# Patient Record
Sex: Female | Born: 1935
Health system: Southern US, Community
[De-identification: ages and names within clinical notes are randomized; demographics above are authoritative.]

## PROBLEM LIST (undated history)

## (undated) DIAGNOSIS — L309 Dermatitis, unspecified: Secondary | ICD-10-CM

## (undated) DIAGNOSIS — I10 Essential (primary) hypertension: Secondary | ICD-10-CM

## (undated) DIAGNOSIS — E0500 Thyrotoxicosis with diffuse goiter without thyrotoxic crisis or storm: Secondary | ICD-10-CM

## (undated) HISTORY — PX: TUBAL LIGATION: SHX77

## (undated) HISTORY — DX: Thyrotoxicosis with diffuse goiter without thyrotoxic crisis or storm: E05.00

## (undated) HISTORY — DX: Essential (primary) hypertension: I10

## (undated) HISTORY — DX: Dermatitis, unspecified: L30.9

## (undated) HISTORY — PX: TOTAL ABDOMINAL HYSTERECTOMY: SHX209

---

## 1999-05-06 ENCOUNTER — Other Ambulatory Visit: Admission: RE | Admit: 1999-05-06 | Discharge: 1999-05-06 | Payer: Self-pay | Admitting: *Deleted

## 2000-07-12 ENCOUNTER — Other Ambulatory Visit: Admission: RE | Admit: 2000-07-12 | Discharge: 2000-07-12 | Payer: Self-pay | Admitting: Internal Medicine

## 2002-06-23 ENCOUNTER — Encounter: Admission: RE | Admit: 2002-06-23 | Discharge: 2002-06-23 | Payer: Self-pay | Admitting: Internal Medicine

## 2002-06-23 ENCOUNTER — Encounter: Payer: Self-pay | Admitting: Internal Medicine

## 2003-04-21 ENCOUNTER — Emergency Department (HOSPITAL_COMMUNITY): Admission: EM | Admit: 2003-04-21 | Discharge: 2003-04-21 | Payer: Self-pay | Admitting: Emergency Medicine

## 2008-03-11 ENCOUNTER — Emergency Department (HOSPITAL_COMMUNITY): Admission: EM | Admit: 2008-03-11 | Discharge: 2008-03-11 | Payer: Self-pay | Admitting: Emergency Medicine

## 2010-07-20 ENCOUNTER — Encounter (HOSPITAL_COMMUNITY): Payer: Self-pay | Admitting: Radiology

## 2010-07-20 ENCOUNTER — Inpatient Hospital Stay (HOSPITAL_COMMUNITY)
Admission: EM | Admit: 2010-07-20 | Discharge: 2010-07-22 | DRG: 379 | Disposition: A | Discharge status: Home / Self Care | Payer: Medicare Other | Attending: Internal Medicine | Admitting: Internal Medicine

## 2010-07-20 ENCOUNTER — Emergency Department (HOSPITAL_COMMUNITY): Payer: Medicare Other

## 2010-07-20 DIAGNOSIS — D500 Iron deficiency anemia secondary to blood loss (chronic): Secondary | ICD-10-CM | POA: Diagnosis not present

## 2010-07-20 DIAGNOSIS — K621 Rectal polyp: Secondary | ICD-10-CM | POA: Diagnosis present

## 2010-07-20 DIAGNOSIS — K625 Hemorrhage of anus and rectum: Principal | ICD-10-CM | POA: Diagnosis present

## 2010-07-20 DIAGNOSIS — K620 Anal polyp: Secondary | ICD-10-CM | POA: Diagnosis present

## 2010-07-20 DIAGNOSIS — I10 Essential (primary) hypertension: Secondary | ICD-10-CM | POA: Diagnosis present

## 2010-07-20 DIAGNOSIS — K5730 Diverticulosis of large intestine without perforation or abscess without bleeding: Secondary | ICD-10-CM | POA: Diagnosis present

## 2010-07-20 HISTORY — DX: Essential (primary) hypertension: I10

## 2010-07-20 LAB — POCT I-STAT, CHEM 8
BUN: 19 mg/dL (ref 6–23)
Calcium, Ion: 1.16 mmol/L (ref 1.12–1.32)
Chloride: 109 mEq/L (ref 96–112)
Creatinine, Ser: 0.9 mg/dL (ref 0.4–1.2)
Glucose, Bld: 122 mg/dL — ABNORMAL HIGH (ref 70–99)
TCO2: 22 mmol/L (ref 0–100)

## 2010-07-20 LAB — DIFFERENTIAL
Lymphocytes Relative: 19 % (ref 12–46)
Lymphs Abs: 1.3 10*3/uL (ref 0.7–4.0)
Monocytes Absolute: 0.4 10*3/uL (ref 0.1–1.0)
Monocytes Relative: 6 % (ref 3–12)
Neutro Abs: 4.9 10*3/uL (ref 1.7–7.7)
Neutrophils Relative %: 73 % (ref 43–77)

## 2010-07-20 LAB — HEMOGLOBIN AND HEMATOCRIT, BLOOD
HCT: 38 % (ref 36.0–46.0)
HCT: 36 % (ref 36.0–46.0)
Hemoglobin: 12.5 g/dL (ref 12.0–15.0)
Hemoglobin: 12.1 g/dL (ref 12.0–15.0)

## 2010-07-20 LAB — CBC
HCT: 38.8 % (ref 36.0–46.0)
Hemoglobin: 12.6 g/dL (ref 12.0–15.0)
MCH: 28.1 pg (ref 26.0–34.0)
MCHC: 32.5 g/dL (ref 30.0–36.0)
RBC: 4.49 MIL/uL (ref 3.87–5.11)

## 2010-07-20 MED ORDER — IOHEXOL 300 MG/ML  SOLN
100.0000 mL | Freq: Once | INTRAMUSCULAR | Status: AC | PRN
  Administered 2010-07-20: 100 mL via INTRAVENOUS

## 2010-07-21 LAB — BASIC METABOLIC PANEL
BUN: 10 mg/dL (ref 6–23)
Calcium: 8.3 mg/dL — ABNORMAL LOW (ref 8.4–10.5)
Chloride: 109 mEq/L (ref 96–112)
Creatinine, Ser: 0.64 mg/dL (ref 0.4–1.2)
GFR calc Af Amer: >60 mL/min (ref >60)
GFR calc non Af Amer: >60 mL/min (ref >60)

## 2010-07-21 LAB — HEMOGLOBIN AND HEMATOCRIT, BLOOD: Hemoglobin: 11.3 g/dL — ABNORMAL LOW (ref 12.0–15.0)

## 2010-07-22 ENCOUNTER — Other Ambulatory Visit: Payer: Self-pay | Admitting: Gastroenterology

## 2010-07-22 LAB — CBC
MCH: 27.8 pg (ref 26.0–34.0)
MCHC: 32.5 g/dL (ref 30.0–36.0)
MCV: 85.4 fL (ref 78.0–100.0)
Platelets: 230 10*3/uL (ref 150–400)
RBC: 4.18 MIL/uL (ref 3.87–5.11)

## 2010-07-29 NOTE — Op Note (Signed)
Ashley Johns, Ashley Johns               ACCOUNT NO.:  [id number]  MEDICAL RECORD NO.:  [id number]  LOCATION:  1517                         FACILITY:  [facility]  PHYSICIAN:  Bernette Redbird, M.D.   DATE OF BIRTH:  10-20-1935  DATE OF PROCEDURE:  07/22/2010 DATE OF DISCHARGE:                              OPERATIVE REPORT   PROCEDURE:  Flexible sigmoidoscopy with biopsy.  INDICATIONS:  The patient is a 75 year old female approximately 1 year status post screening colonoscopy by Dr. Bosie Clos that showed some mild patchy colitis both endoscopically and on biopsies, who presented to the hospital 2 days ago with  painless small-volume hematochezia.  After one or such bowel movements, subsequent bowel movements have been nonbloody and her hemoglobin which has dropped several grams since admission, has leveled off.  FINDINGS:  Solitary diverticulum in the sigmoid region.  No definite alternative source of bleeding identified.  PROCEDURE:  The procedure had been discussed with the patient who provided written consent and was brought from her hospital room into the endoscopy unit.  Sedation was fentanyl 25 mcg and Versed 4 mg IV without clinical instability.  A demonstration scope with the new Olympus 190 series, pediatric adjustable tension colonoscope, was used for this procedure following a Fleet's enema prep.  The scope was advanced to what I believe was the midportion of the transverse colon, with further advancement inhibited by the presence of a moderate amount of stool at the proximal extent of the exam.  Pullback was then performed.  The quality of prep from roughly the splenic flexure distally was excellent and it is felt that all areas in that section of the colon were well seen.  There was no blood in the colonic lumen, consistent with the fact that the patient did not see any blood during her prep.  Thus, it is very clear that the patient has stopped bleeding.  The stool at the  proximal extent of the exam was completely nonbloody in appearance.  No definite source of bleeding was seen on this exam.  At least one or two sigmoid diverticula were observed.  There was a small smooth roughly 4-mm sessile polyp in the distal rectum, nonhemorrhagic in appearance, which was removed by several cold biopsies.  This was otherwise normal exam.  There was no evident colitis, no patchy inflammation has had been observed a year ago.  No large polyps, cancer or vascular ectasia were noted.  No extensive diverticular change was seen.  Retroflexion in the rectum was otherwise unremarkable.  Pullout through the anal canal did not really demonstrate any significant internal hemorrhoids.  The patient tolerated the procedure well and there were no apparent complications.  IMPRESSION: 1. Recent rectal bleeding, without definite source seen on this     examination, currently quiescent 2. Minimal sigmoid diverticulosis. 3. Small rectal polyp.  PLAN:  Await pathology results on the polyp.  Okay for discharge.          ______________________________ Bernette Redbird, M.D.     RB/MEDQ  D:  07/22/2010  T:  07/22/2010  Job:  [id number]  cc:   Merlene Laughter. Renae Gloss, M.D. Fax: [phone]  Lorma Friar, MD Fax: [phone]  Electronically  Signed by Bernette Redbird M.D. on 07/29/2010 12:25:22 PM

## 2010-08-08 ENCOUNTER — Other Ambulatory Visit: Payer: Self-pay | Admitting: Internal Medicine

## 2010-08-08 DIAGNOSIS — Z1239 Encounter for other screening for malignant neoplasm of breast: Secondary | ICD-10-CM

## 2010-08-12 ENCOUNTER — Ambulatory Visit: Payer: Medicare Other

## 2010-08-18 ENCOUNTER — Ambulatory Visit: Payer: Medicare Other

## 2010-08-19 ENCOUNTER — Ambulatory Visit
Admission: RE | Admit: 2010-08-19 | Discharge: 2010-08-19 | Disposition: A | Discharge status: Home / Self Care | Payer: Medicare Other | Source: Ambulatory Visit | Attending: Internal Medicine | Admitting: Internal Medicine

## 2010-08-19 DIAGNOSIS — Z1239 Encounter for other screening for malignant neoplasm of breast: Secondary | ICD-10-CM

## 2010-08-26 NOTE — Discharge Summary (Signed)
  NAMEMarland Kitchen  CATHERIN, DOORN NO.:  [id number]  MEDICAL RECORD NO.:  [id number]  LOCATION:  1517                         FACILITY:  [facility]  PHYSICIAN:  Zannie Cove, MD     DATE OF BIRTH:  [birth year]  DATE OF ADMISSION:  07/20/2010 DATE OF DISCHARGE:                              DISCHARGE SUMMARY   PRIMARY CARE PHYSICIAN:  Merlene Laughter. Renae Gloss, MD  GASTROENTEROLOGIST:  Ceazia Friar, MD  DISCHARGE DIAGNOSES: 1. Bright red blood per rectum. 2. Minimal sigmoid diverticulosis and small rectal polyp, on     colonoscopy. 3. Hypertension. 4. History of hysterectomy. 5. History of thyroid surgery.  DISCHARGE MEDICATIONS:  Are as follows; 1. Atenolol 50 mg p.o. daily. 2. Simvastatin 20 mg p.o. daily. 3. Flonase 1 spray daily as needed. 4. Benicar HCT 20/12.5 one tablet daily. 5. Mometasone 0.1% cream topical application b.i.d. p.r.n. 6. Vitamin C 500 mg b.i.d. 7. Flaxseed 1 tablet daily. 8. Zyrtec 10 mg daily.  CONSULTANTS: 1. John C. Madilyn Fireman, MD 2. Bernette Redbird, MD with [facility].  PROCEDURES:  Flex sigmoidoscopy July 22, 2010 showed minimal sigmoid diverticulosis and small rectal polyps.  HISTORY OF PRESENT ILLNESS:  Ms. Vanorman is a very pleasant 75 year old African American female with history of hypertension presented to the hospital with bright red blood per rectum.  She has had a colonoscopy a year ago which showed some question patchy colitis.  She was admitted to the hospital for the same.  Subsequent day, she had decreased.  She had more dark brown stool and mixed with blood and less bleeding.  Her hemoglobin remained stable in the mid 11 range.  Underwent a flex sigmoidoscopy with results as dictated above.  The patient has remained clinically stable.  Her bleeding has subsided and rectal polyps was been biopsied.  Results are pending at this point.  Discharge condition is stable.  Rest of her chronic medical problems remained  stable.  DISCHARGE LABS:  White count 5.9, hemoglobin 11.6, platelets 230.  DISCHARGE FOLLOWUP:  With Dr. Andi Devon in 7 to 10 days.     Zannie Cove, MD     PJ/MEDQ  D:  07/22/2010  T:  07/22/2010  Job:  [id number]  cc:   Anahi Friar, MD Fax: [phone]  Bernette Redbird, M.D. Fax: [phone]  Merlene Laughter. Renae Gloss, M.D. Fax: [phone]  Electronically Signed by Zannie Cove  on 08/26/2010 07:32:32 PM

## 2010-08-27 NOTE — Consult Note (Signed)
  NAMEJONNELL, HENTGES               ACCOUNT NO.:  [id number]  MEDICAL RECORD NO.:  [id number]  LOCATION:  WLED                         FACILITY:  [facility]  PHYSICIAN:  Enna Warwick C. Madilyn Fireman, M.D.    DATE OF BIRTH:  October 20, 1935  DATE OF CONSULTATION:  07/20/2010 DATE OF DISCHARGE:                                CONSULTATION   REASON FOR CONSULTATION:  Rectal bleeding.  HISTORY OF PRESENT ILLNESS:  Patient is a 75 year old white female with a history of hypertension, who awoke this morning with urgency to defecate and had a bloody maroon-colored stool without any pain and then came to the Emergency Room and had a second one here.  Her hemoglobin was 12.8 with a BUN of 19 and creatinine of 0.9.  She has stable vital signs.  Her last bowel movement prior to this looked normal.  She denies any history of peptic ulcer disease or any NSAID use.  She had a screening colonoscopy by Dr. Bosie Clos in May 2011 and this showed patchy erythema and ulcerated mucosa throughout the entire colon, which was apparently very unimpressive endoscopically.  Biopsy showed minimal colitis.  She had to come in for follow-up and she had a basically negative GI review of systems and he did not recommend any further workup or treatment and she has not had any real colitis symptoms since that time up until bleeding began this morning.  PAST MEDICAL HISTORY:  Hypertension.  PAST SURGICAL HISTORY: 1. Hysterectomy. 2. Thyroid surgery.  MEDICATIONS: 1. Atenolol. 2. Benicar. 3. HCT.  SOCIAL HISTORY:  Denies alcohol or tobacco use.  FAMILY HISTORY:  Negative for GI malignancy.  ALLERGIES:  None known.  PHYSICAL EXAMINATION:  GENERAL:  Well-developed, well-nourished black female, in no acute distress. VITAL SIGNS:  Blood pressure 174/93 initially, temperature 98.3, pulse 76. HEART:  Regular rate and rhythm without murmur. LUNGS:  Clear. ABDOMEN:  Soft, nondistended with normoactive bowel sounds.   No hepatosplenomegaly, mass or guarding.  IMPRESSION:  Sudden non destabilizing apparently lower gastrointestinal bleeding in a patient with questionable minimal colitis and screening colonoscopy a year ago.  PLAN:  We will observe stools and hemoglobin.  We will probably prep tomorrow for either sigmoidoscopy or colonoscopy the following day.          ______________________________ Everardo All. Madilyn Fireman, M.D.     JCH/MEDQ  D:  07/20/2010  T:  07/20/2010  Job:  [id number]  cc:   Shonnie Friar, MD Fax: [phone]  Electronically Signed by Dorena Cookey M.D. on 08/27/2010 07:00:39 PM

## 2010-08-28 NOTE — H&P (Signed)
NAMEMarland Kitchen  Ashley Johns, Ashley Johns               ACCOUNT NO.:  [id number]  MEDICAL RECORD NO.:  [id number]  LOCATION:  WLED                         FACILITY:  [facility]  PHYSICIAN:  Calvert Cantor, M.D.     DATE OF BIRTH:  Mar 03, 1935  DATE OF ADMISSION:  07/20/2010 DATE OF DISCHARGE:                             HISTORY & PHYSICAL   PRIMARY CARE PHYSICIAN:  Merlene Laughter. Renae Gloss, MD  REFERRING PHYSICIAN:  Deanna Artis, MD  PRESENTING COMPLAINT:  Rectal bleed.  HISTORY OF PRESENT ILLNESS:  This is a 75 year old female with a history of hypertension, who comes in with the complaint of rectal bleed.  She states that she went to the bathroom at 5 o'clock this morning and passed what looked like sheer wine and subsequently passed maroon stool. She then came into the ER for this complaint and had another maroon bowel movement in the ER.  She does not complain of any abdominal pain. She has not had any dizziness or lightheadedness.  She has never had the symptoms in the past.  She did have a colonoscopy last year in February, performed by [facility] and was told that she has a weak wall.  The patient does not have any history of ulcers, heartburn, or reflux.  PAST MEDICAL HISTORY: 1. Hypertension. 2. 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  SOCIAL HISTORY:  Smoked but this was greater than 50 years ago.  Does not drink any alcohol or use drugs.  She is married.  She is retired.  PAST SURGICAL HISTORY:  Total hysterectomy and tumor removal from the stomach as mentioned above.  ALLERGIES:  No known drug allergies.  FAMILY HISTORY:  She is not aware of any medical problems in her family.  HOME MEDICATIONS: 1. Atenolol, dose unknown. 2. Benicar/hydrochlorothiazide she believes 20/12.5 mg daily. 3. Zyrtec 1 tablet daily.  REVIEW OF SYSTEMS:  She has gained about 30 pounds in the past 6 months. She has sweats usually at night.  No fevers or  chills.  No fatigue. HEENT:  No frequent headaches.  She has some blurred vision and wears glasses.  No diplopia.  No sore throat.  She does currently have sinus drainage.  No earache.  RESPIRATORY:  Positive for dry cough.  No shortness of breath.  CARDIAC:  No chest pain, palpitations, or pedal edema.  No orthopnea.  GI:  No nausea, vomiting, diarrhea, or constipation.  GI bleed per HPI.  No history of reflux or ulcers.  GU: No dysuria or hematuria.  HEMATOLOGIC:  No easy bruising.  SKIN:  No rash.  MUSCULOSKELETAL:  No arthritis or back pain.  NEUROLOGIC:  No history of strokes, seizures or syncope.  No focal numbness or weakness. PSYCHOLOGIC:  No anxiety or depression.  PHYSICAL EXAMINATION:  VITAL SIGNS:  Blood pressure 174/93, pulse 76, respiratory rate 18, temperature 98.3, and pulse ox is 100% on room air. She has had orthostatic vitals done in the ER which were negative for orthostatic hypotension. HEENT:  Pupils equal, round, reactive to light.  Extraocular movements are intact.  Conjunctivae are pink.  No  scleral icterus.  Oral mucosa is moist.  Oropharynx clear.  Poor dentition. NECK:  Supple.  No thyromegaly, lymphadenopathy, or carotid bruits. HEART:  Regular rate and rhythm.  No murmurs, rubs, or gallops. LUNGS:  Clear bilaterally.  Normal respiratory effort.  No use of accessory muscles. ABDOMEN:  Soft, nontender, nondistended.  Bowel sounds positive. EXTREMITIES:  No cyanosis, clubbing or edema.  Pedal pulses positive. NEUROLOGIC:  Cranial nerves II through XII intact.  Able to move all four extremities appropriately. PSYCHOLOGIC:  Awake, alert, oriented x3.  Mood and affect normal. SKIN:  Warm and dry.  No rash or bruising.  BLOOD WORK:  CBC is within normal limits.  Hemoglobin is currently 14.3 with a hematocrit of 42.0.  Metabolic panel reveals a glucose of 122, BUN is 19, and creatinine 0.9.  Stool occult blood is positive.  IMAGING:  CT of the abdomen and  pelvis with contrast reveals scattered areas of mild colonic wall thickening without a focal lesion or pericolonic stranding.  Disk herniation at L3-L4.  A 0.5 cm low density structures involving the pancreatic uncinate process which probably represents a small area of focal fat, but cannot exclude a tiny cyst.  EKG is pending.  ASSESSMENT AND PLAN: 1. Rectal bleed maybe diverticulosis versus AVM.  I do suspect this     lower GI rather than upper.  I have ordered hemoglobin and     hematocrits every 6 hours for now.  She will be on clear liquids     only.  I have also contacted Dr. Madilyn Fireman with [facility] to request a     consult. 2. Hypertension.  We will resume her Benicar.  We will resume atenolol     once I know the dosage. 3. Obesity. 4. Sinusitis versus seasonal allergies.  Continue Zyrtec.  The patient would like to be a full code.  DVT prophylaxis with SCDs stockings.  Time on admission was 55 minutes.     Calvert Cantor, M.D.     SR/MEDQ  D:  07/20/2010  T:  07/20/2010  Job:  [id number]  cc:   Merlene Laughter. Renae Gloss, M.D. Fax: [phone]  Electronically Signed by Calvert Cantor M.D. on 08/28/2010 12:09:46 PM

## 2011-09-08 ENCOUNTER — Other Ambulatory Visit: Payer: Self-pay | Admitting: Internal Medicine

## 2011-09-08 DIAGNOSIS — Z1231 Encounter for screening mammogram for malignant neoplasm of breast: Secondary | ICD-10-CM

## 2012-01-08 ENCOUNTER — Ambulatory Visit: Payer: BC Managed Care – PPO

## 2012-09-12 ENCOUNTER — Other Ambulatory Visit: Payer: Self-pay

## 2012-09-12 DIAGNOSIS — Z1231 Encounter for screening mammogram for malignant neoplasm of breast: Secondary | ICD-10-CM

## 2012-09-27 ENCOUNTER — Ambulatory Visit: Payer: Medicare Other

## 2013-10-11 ENCOUNTER — Ambulatory Visit: Payer: Medicare Other

## 2013-10-13 ENCOUNTER — Ambulatory Visit: Payer: Medicare Other

## 2016-11-10 DIAGNOSIS — M9903 Segmental and somatic dysfunction of lumbar region: Secondary | ICD-10-CM | POA: Diagnosis not present

## 2016-11-10 DIAGNOSIS — M9902 Segmental and somatic dysfunction of thoracic region: Secondary | ICD-10-CM | POA: Diagnosis not present

## 2016-11-10 DIAGNOSIS — M546 Pain in thoracic spine: Secondary | ICD-10-CM | POA: Diagnosis not present

## 2016-11-10 DIAGNOSIS — M545 Low back pain: Secondary | ICD-10-CM | POA: Diagnosis not present

## 2016-11-23 DIAGNOSIS — I10 Essential (primary) hypertension: Secondary | ICD-10-CM | POA: Diagnosis not present

## 2016-11-23 DIAGNOSIS — H9190 Unspecified hearing loss, unspecified ear: Secondary | ICD-10-CM | POA: Diagnosis not present

## 2016-11-23 DIAGNOSIS — Z0000 Encounter for general adult medical examination without abnormal findings: Secondary | ICD-10-CM | POA: Diagnosis not present

## 2016-11-23 DIAGNOSIS — M25531 Pain in right wrist: Secondary | ICD-10-CM | POA: Diagnosis not present

## 2016-11-23 DIAGNOSIS — Z23 Encounter for immunization: Secondary | ICD-10-CM | POA: Diagnosis not present

## 2016-11-23 DIAGNOSIS — E782 Mixed hyperlipidemia: Secondary | ICD-10-CM | POA: Diagnosis not present

## 2017-01-22 DIAGNOSIS — J22 Unspecified acute lower respiratory infection: Secondary | ICD-10-CM | POA: Diagnosis not present

## 2017-05-24 DIAGNOSIS — I10 Essential (primary) hypertension: Secondary | ICD-10-CM | POA: Diagnosis not present

## 2017-05-24 DIAGNOSIS — E559 Vitamin D deficiency, unspecified: Secondary | ICD-10-CM | POA: Diagnosis not present

## 2017-05-24 DIAGNOSIS — E782 Mixed hyperlipidemia: Secondary | ICD-10-CM | POA: Diagnosis not present

## 2017-08-05 DIAGNOSIS — H903 Sensorineural hearing loss, bilateral: Secondary | ICD-10-CM | POA: Diagnosis not present

## 2017-11-08 ENCOUNTER — Telehealth: Payer: Self-pay | Admitting: Nurse Practitioner

## 2017-11-08 NOTE — Telephone Encounter (Signed)
I left a message asking the pt to come in at 10:30 instead of 11:00 on 12/01/17. VDM (DD)

## 2017-11-29 ENCOUNTER — Encounter: Payer: Self-pay | Admitting: Nurse Practitioner

## 2017-11-29 DIAGNOSIS — R059 Cough, unspecified: Secondary | ICD-10-CM

## 2017-11-29 DIAGNOSIS — R05 Cough: Secondary | ICD-10-CM | POA: Insufficient documentation

## 2017-11-29 DIAGNOSIS — J00 Acute nasopharyngitis [common cold]: Secondary | ICD-10-CM | POA: Insufficient documentation

## 2017-12-01 ENCOUNTER — Ambulatory Visit: Payer: Self-pay | Admitting: Internal Medicine

## 2017-12-01 ENCOUNTER — Ambulatory Visit: Payer: Self-pay

## 2017-12-01 ENCOUNTER — Ambulatory Visit: Payer: Self-pay | Admitting: Nurse Practitioner

## 2017-12-08 ENCOUNTER — Ambulatory Visit: Payer: Self-pay

## 2017-12-08 ENCOUNTER — Ambulatory Visit: Payer: Self-pay | Admitting: Nurse Practitioner

## 2017-12-09 ENCOUNTER — Ambulatory Visit: Payer: Self-pay

## 2017-12-09 ENCOUNTER — Ambulatory Visit (INDEPENDENT_AMBULATORY_CARE_PROVIDER_SITE_OTHER): Payer: Medicare Other

## 2017-12-09 ENCOUNTER — Ambulatory Visit (INDEPENDENT_AMBULATORY_CARE_PROVIDER_SITE_OTHER): Payer: Medicare Other | Admitting: Nurse Practitioner

## 2017-12-09 ENCOUNTER — Encounter: Payer: Self-pay | Admitting: Nurse Practitioner

## 2017-12-09 VITALS — BP 130/80 | HR 71 | Temp 97.4°F | Ht 60.75 in | Wt 178.0 lb

## 2017-12-09 DIAGNOSIS — Z0000 Encounter for general adult medical examination without abnormal findings: Secondary | ICD-10-CM

## 2017-12-09 DIAGNOSIS — R7303 Prediabetes: Secondary | ICD-10-CM | POA: Diagnosis not present

## 2017-12-09 DIAGNOSIS — J309 Allergic rhinitis, unspecified: Secondary | ICD-10-CM | POA: Diagnosis not present

## 2017-12-09 DIAGNOSIS — I10 Essential (primary) hypertension: Secondary | ICD-10-CM

## 2017-12-09 DIAGNOSIS — Z7982 Long term (current) use of aspirin: Secondary | ICD-10-CM

## 2017-12-09 LAB — BMP8+EGFR
BUN / CREAT RATIO: 15 (ref 12–28)
BUN: 16 mg/dL (ref 8–27)
CHLORIDE: 106 mmol/L (ref 96–106)
CO2: 25 mmol/L (ref 20–29)
Calcium: 9.1 mg/dL (ref 8.7–10.3)
Creatinine, Ser: 1.06 mg/dL — ABNORMAL HIGH (ref 0.57–1.00)
GFR, EST AFRICAN AMERICAN: 57 mL/min/{1.73_m2} — AB (ref >59)
GFR, EST NON AFRICAN AMERICAN: 50 mL/min/{1.73_m2} — AB (ref >59)
Glucose: 105 mg/dL — ABNORMAL HIGH (ref 65–99)
POTASSIUM: 4.2 mmol/L (ref 3.5–5.2)
Sodium: 147 mmol/L — ABNORMAL HIGH (ref 134–144)

## 2017-12-09 LAB — HEMOGLOBIN A1C
Est. average glucose Bld gHb Est-mCnc: 123 mg/dL
Hgb A1c MFr Bld: 5.9 % — ABNORMAL HIGH (ref 4.8–5.6)

## 2017-12-09 NOTE — Progress Notes (Signed)
Subjective:   Ashley Johns is a 82 y.o. female who presents for Medicare Annual (Subsequent) preventive examination.  Review of Systems:  n/a Cardiac Risk Factors include: hypertension;obesity (BMI >30kg/m2);advanced age (>49men, >87 women);dyslipidemia     Objective:     Vitals: BP 130/80 (BP Location: Left Arm)   Pulse 71   Temp (!) 97.4 F (36.3 C)   Ht 5' 0.75" (1.543 m)   Wt 178 lb (80.7 kg)   BMI 33.91 kg/m   Body mass index is 33.91 kg/m.  Advanced Directives 12/09/2017  Does Patient Have a Medical Advance Directive? Yes  Type of Estate agent of [place];Living will  Does patient want to make changes to medical advance directive? No - Patient declined  Copy of Healthcare Power of Attorney in Chart? No - copy requested    Tobacco Social History   Tobacco Use  Smoking Status Never Smoker  Smokeless Tobacco Never Used     Counseling given: Not Answered   Clinical Intake:  Pre-visit preparation completed: Yes  Pain : No/denies pain Pain Score: 0-No pain     Nutritional Status: BMI > 30  Obese Nutritional Risks: None Diabetes: No  How often do you need to have someone help you when you read instructions, pamphlets, or other written materials from your doctor or pharmacy?: 1 - Never What is the last grade level you completed in school?: 12 th grade  Interpreter Needed?: No  Information entered by :: NAllen LPN  Past Medical History:  Diagnosis Date  . Hypertension    History reviewed. No pertinent surgical history. History reviewed. No pertinent family history. Social History   Socioeconomic History  . Marital status: Married    Spouse name: Not on file  . Number of children: Not on file  . Years of education: Not on file  . Highest education level: Not on file  Occupational History  . Not on file  Social Needs  . Financial resource strain: Not hard at all  . Food insecurity:    Worry: Never true    Inability:  Never true  . Transportation needs:    Medical: No    Non-medical: No  Tobacco Use  . Smoking status: Never Smoker  . Smokeless tobacco: Never Used  Substance and Sexual Activity  . Alcohol use: Never    Frequency: Never  . Drug use: Never  . Sexual activity: Not Currently  Lifestyle  . Physical activity:    Days per week: 2 days    Minutes per session: 30 min  . Stress: Not at all  Relationships  . Social connections:    Talks on phone: Not on file    Gets together: Not on file    Attends religious service: Not on file    Active member of club or organization: Not on file    Attends meetings of clubs or organizations: Not on file    Relationship status: Not on file  Other Topics Concern  . Not on file  Social History Narrative  . Not on file    Outpatient Encounter Medications as of 12/09/2017  Medication Sig  . aspirin 81 MG chewable tablet Chew 81 mg by mouth.  Marland Kitchen atenolol (TENORMIN) 50 MG tablet Take 50 mg by mouth daily.  . cyclobenzaprine (FLEXERIL) 5 MG tablet Take 5 mg by mouth 3 (three) times daily as needed for muscle spasms.  Marland Kitchen EPINEPHrine (EPIPEN 2-PAK) 0.3 mg/0.3 mL IJ SOAJ injection Inject into the muscle  once. Inject 0.3mg  intramuscular route once as needed.  Marland Kitchen ipratropium-albuterol (DUONEB) 0.5-2.5 (3) MG/3ML SOLN Take 3 mLs by nebulization. Two puffs twice daily  . mometasone (ELOCON) 0.1 % cream Apply 1 application topically daily.  Marland Kitchen OLMESARTAN MEDOXOMIL PO Take 1 tablet by mouth once.  . simvastatin (ZOCOR) 20 MG tablet Take 20 mg by mouth daily.  Marland Kitchen triamterene-hydrochlorothiazide (DYAZIDE) 37.5-25 MG capsule Take 1 capsule by mouth daily. Take 1/2 tablet daily   No facility-administered encounter medications on file as of 12/09/2017.     Activities of Daily Living In your present state of health, do you have any difficulty performing the following activities: 12/09/2017  Hearing? Y  Comment getting hearing aid for left ear  Vision? Y  Difficulty  concentrating or making decisions? N  Walking or climbing stairs? N  Dressing or bathing? N  Doing errands, shopping? N  Preparing Food and eating ? N  Using the Toilet? N  In the past six months, have you accidently leaked urine? Y  Comment wears depends  Do you have problems with loss of bowel control? N  Managing your Medications? N  Managing your Finances? N  Housekeeping or managing your Housekeeping? N  Some recent data might be hidden    Patient Care Team: Arnette Felts, FNP as PCP - General (General Practice)    Assessment:   This is a routine wellness examination for [place].  Exercise Activities and Dietary recommendations Current Exercise Habits: Structured exercise class, Type of exercise: treadmill;walking, Time (Minutes): 20, Frequency (Times/Week): 2, Weekly Exercise (Minutes/Week): 40, Intensity: Moderate  Goals    . DIET - INCREASE WATER INTAKE (pt-stated)     Also would like to decrease salt intake       Fall Risk Fall Risk  12/09/2017 12/09/2017  Falls in the past year? No No  Risk for fall due to : Medication side effect -   Is the patient's home free of loose throw rugs in walkways, pet beds, electrical cords, etc?   yes      Grab bars in the bathroom? yes      Handrails on the stairs?   yes      Adequate lighting?   yes  Timed Get Up and Go performed: n/a  Depression Screen PHQ 2/9 Scores 12/09/2017 12/09/2017  PHQ - 2 Score 0 0     Cognitive Function     6CIT Screen 12/09/2017  What Year? 0 points  What month? 0 points  What time? 0 points  Count back from 20 0 points  Months in reverse 4 points  Repeat phrase 0 points  Total Score 4    Immunization History  Administered Date(s) Administered  . Influenza-Unspecified 11/16/2017    Qualifies for Shingles Vaccine? yes  Screening Tests Health Maintenance  Topic Date Due  . TETANUS/TDAP  03/08/1956  . PNA vac Low Risk Adult (1 of 2 - PCV13) 03/08/2002  . INFLUENZA VACCINE   09/09/2017  . DEXA SCAN  Completed    Cancer Screenings: Lung: Low Dose CT Chest recommended if Age 27-80 years, 30 pack-year currently smoking OR have quit w/in 15years. Patient does not qualify. Breast:  Up to date on Mammogram? Yes   Up to date of Bone Density/Dexa? Yes Colorectal: not required  Additional Screenings: : Hepatitis C Screening: n/a     Plan:   Patient wants to decrease salt intake and increase water intake.  I have personally reviewed and noted the following in the patient's chart:   .  Medical and social history . Use of alcohol, tobacco or illicit drugs  . Current medications and supplements . Functional ability and status . Nutritional status . Physical activity . Advanced directives . List of other physicians . Hospitalizations, surgeries, and ER visits in previous 12 months . Vitals . Screenings to include cognitive, depression, and falls . Referrals and appointments  In addition, I have reviewed and discussed with patient certain preventive protocols, quality metrics, and best practice recommendations. A written personalized care plan for preventive services as well as general preventive health recommendations were provided to patient.     Barb Merino, LPN  16/11/9602

## 2017-12-09 NOTE — Patient Instructions (Signed)
[name] , Thank you for taking time to come for your Medicare Wellness Visit. I appreciate your ongoing commitment to your health goals. Please review the following plan we discussed and let me know if I can assist you in the future.   Screening recommendations/referrals: Colonoscopy: not required Mammogram: not required Bone Density: up to date Recommended yearly ophthalmology/optometry visit for glaucoma screening and checkup Recommended yearly dental visit for hygiene and checkup  Vaccinations: Influenza vaccine: 11/2017 Pneumococcal vaccine: 05/2015 Tdap vaccine: 01/2013 Shingles vaccine: decline    Advanced directives: Please bring a copy of your POA (Power of [place]) and/or Living Will to your next appointment.    Conditions/risks identified: Obesity: patient exercises twice a week. She is working on increasing her water intake and decreasing her salt intake.  Next appointment: 06/09/2018 at 10:00   Preventive Care 65 Years and Older, Female Preventive care refers to lifestyle choices and visits with your health care provider that can promote health and wellness. What does preventive care include?  A yearly physical exam. This is also called an annual well check.  Dental exams once or twice a year.  Routine eye exams. Ask your health care provider how often you should have your eyes checked.  Personal lifestyle choices, including:  Daily care of your teeth and gums.  Regular physical activity.  Eating a healthy diet.  Avoiding tobacco and drug use.  Limiting alcohol use.  Practicing safe sex.  Taking low-dose aspirin every day.  Taking vitamin and mineral supplements as recommended by your health care provider. What happens during an annual well check? The services and screenings done by your health care provider during your annual well check will depend on your age, overall health, lifestyle risk factors, and family history of disease. Counseling  Your  health care provider may ask you questions about your:  Alcohol use.  Tobacco use.  Drug use.  Emotional well-being.  Home and relationship well-being.  Sexual activity.  Eating habits.  History of falls.  Memory and ability to understand (cognition).  Work and work Astronomer.  Reproductive health. Screening  You may have the following tests or measurements:  Height, weight, and BMI.  Blood pressure.  Lipid and cholesterol levels. These may be checked every 5 years, or more frequently if you are over 60 years old.  Skin check.  Lung cancer screening. You may have this screening every year starting at age 49 if you have a 30-pack-year history of smoking and currently smoke or have quit within the past 15 years.  Fecal occult blood test (FOBT) of the stool. You may have this test every year starting at age 12.  Flexible sigmoidoscopy or colonoscopy. You may have a sigmoidoscopy every 5 years or a colonoscopy every 10 years starting at age 26.  Hepatitis C blood test.  Hepatitis B blood test.  Sexually transmitted disease (STD) testing.  Diabetes screening. This is done by checking your blood sugar (glucose) after you have not eaten for a while (fasting). You may have this done every 1-3 years.  Bone density scan. This is done to screen for osteoporosis. You may have this done starting at age 3.  Mammogram. This may be done every 1-2 years. Talk to your health care provider about how often you should have regular mammograms. Talk with your health care provider about your test results, treatment options, and if necessary, the need for more tests. Vaccines  Your health care provider may recommend certain vaccines, such as:  Influenza vaccine. This is recommended every year.  Tetanus, diphtheria, and acellular pertussis (Tdap, Td) vaccine. You may need a Td booster every 10 years.  Zoster vaccine. You may need this after age 55.  Pneumococcal 13-valent  conjugate (PCV13) vaccine. One dose is recommended after age 73.  Pneumococcal polysaccharide (PPSV23) vaccine. One dose is recommended after age 11. Talk to your health care provider about which screenings and vaccines you need and how often you need them. This information is not intended to replace advice given to you by your health care provider. Make sure you discuss any questions you have with your health care provider. Document Released: 02/22/2015 Document Revised: 10/16/2015 Document Reviewed: 11/27/2014 Elsevier Interactive Patient Education  2017 [place] Prevention in the Home Falls can cause injuries. They can happen to people of all ages. There are many things you can do to make your home safe and to help prevent falls. What can I do on the outside of my home?  Regularly fix the edges of walkways and driveways and fix any cracks.  Remove anything that might make you trip as you walk through a door, such as a raised step or threshold.  Trim any bushes or trees on the path to your home.  Use bright outdoor lighting.  Clear any walking paths of anything that might make someone trip, such as rocks or tools.  Regularly check to see if handrails are loose or broken. Make sure that both sides of any steps have handrails.  Any raised decks and porches should have guardrails on the edges.  Have any leaves, snow, or ice cleared regularly.  Use sand or salt on walking paths during winter.  Clean up any spills in your garage right away. This includes oil or grease spills. What can I do in the bathroom?  Use night lights.  Install grab bars by the toilet and in the tub and shower. Do not use towel bars as grab bars.  Use non-skid mats or decals in the tub or shower.  If you need to sit down in the shower, use a plastic, non-slip stool.  Keep the floor dry. Clean up any water that spills on the floor as soon as it happens.  Remove soap buildup in the tub or  shower regularly.  Attach bath mats securely with double-sided non-slip rug tape.  Do not have throw rugs and other things on the floor that can make you trip. What can I do in the bedroom?  Use night lights.  Make sure that you have a light by your bed that is easy to reach.  Do not use any sheets or blankets that are too big for your bed. They should not hang down onto the floor.  Have a firm chair that has side arms. You can use this for support while you get dressed.  Do not have throw rugs and other things on the floor that can make you trip. What can I do in the kitchen?  Clean up any spills right away.  Avoid walking on wet floors.  Keep items that you use a lot in easy-to-reach places.  If you need to reach something above you, use a strong step stool that has a grab bar.  Keep electrical cords out of the way.  Do not use floor polish or wax that makes floors slippery. If you must use wax, use non-skid floor wax.  Do not have throw rugs and other things on the floor that  can make you trip. What can I do with my stairs?  Do not leave any items on the stairs.  Make sure that there are handrails on both sides of the stairs and use them. Fix handrails that are broken or loose. Make sure that handrails are as long as the stairways.  Check any carpeting to make sure that it is firmly attached to the stairs. Fix any carpet that is loose or worn.  Avoid having throw rugs at the top or bottom of the stairs. If you do have throw rugs, attach them to the floor with carpet tape.  Make sure that you have a light switch at the top of the stairs and the bottom of the stairs. If you do not have them, ask someone to add them for you. What else can I do to help prevent falls?  Wear shoes that:  Do not have high heels.  Have rubber bottoms.  Are comfortable and fit you well.  Are closed at the toe. Do not wear sandals.  If you use a stepladder:  Make sure that it is fully  opened. Do not climb a closed stepladder.  Make sure that both sides of the stepladder are locked into place.  Ask someone to hold it for you, if possible.  Clearly mark and make sure that you can see:  Any grab bars or handrails.  First and last steps.  Where the edge of each step is.  Use tools that help you move around (mobility aids) if they are needed. These include:  Canes.  Walkers.  Scooters.  Crutches.  Turn on the lights when you go into a dark area. Replace any light bulbs as soon as they burn out.  Set up your furniture so you have a clear path. Avoid moving your furniture around.  If any of your floors are uneven, fix them.  If there are any pets around you, be aware of where they are.  Review your medicines with your doctor. Some medicines can make you feel dizzy. This can increase your chance of falling. Ask your doctor what other things that you can do to help prevent falls. This information is not intended to replace advice given to you by your health care provider. Make sure you discuss any questions you have with your health care provider. Document Released: 11/22/2008 Document Revised: 07/04/2015 Document Reviewed: 03/02/2014 Elsevier Interactive Patient Education  2017 [employer].

## 2017-12-09 NOTE — Patient Instructions (Signed)

## 2017-12-09 NOTE — Progress Notes (Addendum)
Subjective:     Patient ID: Ashley Johns , female    DOB: [birth year] , 82 y.o.   MRN: [id number]   Chief Complaint  Patient presents with  . Hypertension    HPI  Hypertension  This is a chronic problem. The current episode started more than 1 year ago. The problem is unchanged. The problem is controlled. Pertinent negatives include no anxiety, headaches, palpitations or shortness of breath. Risk factors for coronary artery disease include sedentary lifestyle, obesity and dyslipidemia. Past treatments include lifestyle changes and angiotensin blockers. There are no compliance problems.   URI   Associated symptoms include congestion, coughing and rhinorrhea. Pertinent negatives include no ear pain, headaches, sinus pain, sneezing, sore throat or wheezing.     Past Medical History:  Diagnosis Date  . Hypertension      No family history on file.   Current Outpatient Medications:  .  aspirin 81 MG chewable tablet, Chew 81 mg by mouth., Disp: , Rfl:  .  atenolol (TENORMIN) 50 MG tablet, Take 50 mg by mouth daily., Disp: , Rfl:  .  cyclobenzaprine (FLEXERIL) 5 MG tablet, Take 5 mg by mouth 3 (three) times daily as needed for muscle spasms., Disp: , Rfl:  .  EPINEPHrine (EPIPEN 2-PAK) 0.3 mg/0.3 mL IJ SOAJ injection, Inject into the muscle once. Inject 0.3mg  intramuscular route once as needed., Disp: , Rfl:  .  ipratropium-albuterol (DUONEB) 0.5-2.5 (3) MG/3ML SOLN, Take 3 mLs by nebulization. Two puffs twice daily, Disp: , Rfl:  .  mometasone (ELOCON) 0.1 % cream, Apply 1 application topically daily., Disp: , Rfl:  .  OLMESARTAN MEDOXOMIL PO, Take 1 tablet by mouth once., Disp: , Rfl:  .  simvastatin (ZOCOR) 20 MG tablet, Take 20 mg by mouth daily., Disp: , Rfl:  .  triamterene-hydrochlorothiazide (DYAZIDE) 37.5-25 MG capsule, Take 1 capsule by mouth daily. Take 1/2 tablet daily, Disp: , Rfl:    Allergies  Allergen Reactions  . Tramadol Nausea Only     Review of Systems   Constitutional: Negative for fatigue and fever.  HENT: Positive for congestion and rhinorrhea. Negative for ear pain, postnasal drip, sinus pressure, sinus pain, sneezing and sore throat.   Eyes: Negative.   Respiratory: Positive for cough. Negative for chest tightness, shortness of breath and wheezing.   Cardiovascular: Negative.  Negative for palpitations.  Musculoskeletal: Negative.   Neurological: Negative.  Negative for dizziness and headaches.  Psychiatric/Behavioral: Negative for hallucinations.     Today's Vitals   12/09/17 1109  BP: 130/80  Pulse: 71  Temp: (!) 97.4 F (36.3 C)  TempSrc: Oral  SpO2: 98%  Weight: 178 lb (80.7 kg)  Height: 5' 0.75" (1.543 m)  PainSc: 0-No pain   Body mass index is 33.91 kg/m.   Objective:  Physical Exam  Constitutional: She is oriented to person, place, and time. She appears well-developed and well-nourished.  HENT:  Right Ear: Tympanic membrane is not bulging.  Left Ear: Tympanic membrane is not bulging.  Eyes: Pupils are equal, round, and reactive to light. Conjunctivae and EOM are normal.  Neck: Normal range of motion. Neck supple.  Cardiovascular: Normal rate, regular rhythm and normal heart sounds.  Pulmonary/Chest: Effort normal and breath sounds normal.  Neurological: She is alert and oriented to person, place, and time.  Skin: Skin is warm and dry.        Assessment And Plan:     1. Essential hypertension  Chronic, controlled  Continue with current medications  AWV done  2. Prediabetes  Chronic, controlled  No current medications  3. Allergic rhinitis, unspecified seasonality, unspecified trigger  Given Norel AD, advised to only take up to two times per day and no more than 3 days in a row.        Arnette Felts, FNP

## 2017-12-24 DIAGNOSIS — R7303 Prediabetes: Secondary | ICD-10-CM | POA: Insufficient documentation

## 2017-12-24 DIAGNOSIS — I10 Essential (primary) hypertension: Secondary | ICD-10-CM | POA: Insufficient documentation

## 2017-12-30 ENCOUNTER — Other Ambulatory Visit: Payer: Self-pay | Admitting: Nurse Practitioner

## 2018-01-05 ENCOUNTER — Telehealth: Payer: Self-pay

## 2018-01-05 NOTE — Telephone Encounter (Signed)
Patient called stating she is not feeling better. She was told to call if she still had a cold. YRL,RMA   Left pt a v/m to call office so I can advise her that she was seen almost a month ago and it could be a new cold so pt needs an appt. YRL,RMA

## 2018-01-11 ENCOUNTER — Encounter: Payer: Self-pay | Admitting: Internal Medicine

## 2018-01-11 ENCOUNTER — Ambulatory Visit
Admission: RE | Admit: 2018-01-11 | Discharge: 2018-01-11 | Disposition: A | Discharge status: Home / Self Care | Payer: Medicare Other | Source: Ambulatory Visit | Attending: Internal Medicine | Admitting: Internal Medicine

## 2018-01-11 ENCOUNTER — Ambulatory Visit (INDEPENDENT_AMBULATORY_CARE_PROVIDER_SITE_OTHER): Payer: Medicare Other | Admitting: Internal Medicine

## 2018-01-11 VITALS — BP 118/64 | HR 78 | Temp 97.4°F | Ht 60.0 in | Wt 174.4 lb

## 2018-01-11 DIAGNOSIS — R05 Cough: Secondary | ICD-10-CM | POA: Diagnosis not present

## 2018-01-11 DIAGNOSIS — R059 Cough, unspecified: Secondary | ICD-10-CM

## 2018-01-11 DIAGNOSIS — R062 Wheezing: Secondary | ICD-10-CM

## 2018-01-11 DIAGNOSIS — Z2089 Contact with and (suspected) exposure to other communicable diseases: Secondary | ICD-10-CM | POA: Diagnosis not present

## 2018-01-11 DIAGNOSIS — J151 Pneumonia due to Pseudomonas: Secondary | ICD-10-CM

## 2018-01-11 MED ORDER — DOXYCYCLINE HYCLATE 100 MG PO TABS: 100.0000 mg | ORAL_TABLET | Freq: Two times a day (BID) | ORAL | 0 refills | Status: DC

## 2018-01-11 MED ORDER — ALBUTEROL SULFATE HFA 108 (90 BASE) MCG/ACT IN AERS: 2.0000 | INHALATION_SPRAY | RESPIRATORY_TRACT | 0 refills | Status: DC | PRN

## 2018-01-11 MED ORDER — CEFTRIAXONE SODIUM 1 G IJ SOLR
1.0000 g | Freq: Once | INTRAMUSCULAR | Status: AC
  Administered 2018-01-11: 1 g via INTRAMUSCULAR

## 2018-01-11 MED ORDER — IPRATROPIUM-ALBUTEROL 0.5-2.5 (3) MG/3ML IN SOLN: 3.0000 mL | Freq: Once | RESPIRATORY_TRACT | Status: DC

## 2018-01-11 NOTE — Patient Instructions (Signed)
Acute Bronchitis, Adult Acute bronchitis is when air tubes (bronchi) in the lungs suddenly get swollen. The condition can make it hard to breathe. It can also cause these symptoms:  A cough.  Coughing up clear, yellow, or green mucus.  Wheezing.  Chest congestion.  Shortness of breath.  A fever.  Body aches.  Chills.  A sore throat.  Follow these instructions at home: Medicines  Take over-the-counter and prescription medicines only as told by your doctor.  If you were prescribed an antibiotic medicine, take it as told by your doctor. Do not stop taking the antibiotic even if you start to feel better. General instructions  Rest.  Drink enough fluids to keep your pee (urine) clear or pale yellow.  Avoid smoking and secondhand smoke. If you smoke and you need help quitting, ask your doctor. Quitting will help your lungs heal faster.  Use an inhaler, cool mist vaporizer, or humidifier as told by your doctor.  Keep all follow-up visits as told by your doctor. This is important. How is this prevented? To lower your risk of getting this condition again:  Wash your hands often with soap and water. If you cannot use soap and water, use hand sanitizer.  Avoid contact with people who have cold symptoms.  Try not to touch your hands to your mouth, nose, or eyes.  Make sure to get the flu shot every year.  Contact a doctor if:  Your symptoms do not get better in 2 weeks. Get help right away if:  You cough up blood.  You have chest pain.  You have very bad shortness of breath.  You become dehydrated.  You faint (pass out) or keep feeling like you are going to pass out.  You keep throwing up (vomiting).  You have a very bad headache.  Your fever or chills gets worse. This information is not intended to replace advice given to you by your health care provider. Make sure you discuss any questions you have with your health care provider. Document Released:  07/15/2007 Document Revised: 09/04/2015 Document Reviewed: 07/17/2015 Elsevier Interactive Patient Education  2018 [employer]. Aspiration Pneumonia Aspiration pneumonia is an infection in your lungs. It occurs when food, liquid, or stomach contents (vomit) are inhaled (aspirated) into your lungs. When these things get into your lungs, swelling (inflammation) and infection can occur. This can make it difficult for you to breathe. Aspiration pneumonia is a serious condition and can be life threatening. What increases the risk? Aspiration pneumonia is more likely to occur when a person's cough (gag) reflex or ability to swallow has been decreased. Some things that can do this include:  Having a brain injury or disease, such as stroke, seizures, Parkinson's disease, dementia, or amyotrophic lateral sclerosis (ALS).  Being given general anesthetic for procedures.  Being in a coma (unconscious).  Having a narrowing of the tube that carries food to the stomach (esophagus).  Drinking too much alcohol. If a person passes out and vomits, vomit can be swallowed into the lungs.  Taking certain medicines, such as tranquilizers or sedatives.  What are the signs or symptoms?  Coughing after swallowing food or liquids.  Breathing problems, such as wheezing or shortness of breath.  Bluish skin. This can be caused by lack of oxygen.  Coughing up food or mucus. The mucus might contain blood, greenish material, or yellowish-white fluid (pus).  Fever.  Chest pain.  Being more tired than usual (fatigue).  Sweating more than usual.  Bad breath.  How is this diagnosed? A physical exam will be done. During the exam, the health care provider will listen to your lungs with a stethoscope to check for:  Crackling sounds in the lungs.  Decreased breath sounds.  A rapid heartbeat.  Various tests may be ordered. These may include:  Chest X-ray.  CT scan.  Swallowing study. This test looks at  how food is swallowed and whether it goes into your breathing tube (trachea) or food pipe (esophagus).  Sputum culture. Saliva and mucus (sputum) are collected from the lungs or the tubes that carry air to the lungs (bronchi). The sputum is then tested for bacteria.  Bronchoscopy. This test uses a flexible tube (bronchoscope) to see inside the lungs.  How is this treated? Treatment will usually include antibiotic medicines. Other medicines may also be used to reduce fever or pain. You may need to be treated in the hospital. In the hospital, your breathing will be carefully monitored. Depending on how well you are breathing, you may need to be given oxygen, or you may need breathing support from a breathing machine (ventilator). For people who fail a swallowing study, a feeding tube might be placed in the stomach, or they may be asked to avoid certain food textures or liquids when they eat. Follow these instructions at home:  Carefully follow any special eating instructions you were given, such as avoiding certain food textures or thickening liquids. This reduces the risk of developing aspiration pneumonia again.  Only take over-the-counter or prescription medicines as directed by your health care provider. Follow the directions carefully.  If you were prescribed antibiotics, take them as directed. Finish them even if you start to feel better.  Rest as instructed by your health care provider.  Keep all follow-up appointments with your health care provider. Contact a health care provider if:  You develop worsening shortness of breath, wheezing, or difficulty breathing.  You develop a fever.  You have chest pain. This information is not intended to replace advice given to you by your health care provider. Make sure you discuss any questions you have with your health care provider. Document Released: 11/23/2008 Document Revised: 07/10/2015 Document Reviewed: 07/14/2012 Elsevier Interactive  Patient Education  2017 [employer]

## 2018-01-11 NOTE — Progress Notes (Signed)
Subjective:     Patient ID: Ashley Johns , female    DOB: [birth year] , 82 y.o.   MRN: [id number]   Chief Complaint  Patient presents with  . URI    patient states she has been coughing a lot and she is having neck pain sinus pressure pt states she has not had a fever.    HPI Onset of sneezing and rhinitis and cough x 2 weeks ago. Has been around sick school 80 y/o kids. 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 Then last week on 11/26 got more stuffy and with sinus pressure. Has been taking Claritin and Robitussin. She visited her sister who was being  treated for pneumonia, but she had been on medication for 3 days.  Has been wheezing. Cough is productive and saw a tinge of blood this am, but only ones. Her nose has not been bleeding. She denies body aches, has mild HA on sinuses region.   Past Medical History:  Diagnosis Date  . Hypertension        Current Outpatient Medications:  .  atenolol (TENORMIN) 50 MG tablet, Take 50 mg by mouth daily., Disp: , Rfl:  .  mometasone (ELOCON) 0.1 % cream, Apply 1 application topically daily., Disp: , Rfl:  .  OLMESARTAN MEDOXOMIL PO, Take 1 tablet by mouth once., Disp: , Rfl:  .  simvastatin (ZOCOR) 20 MG tablet, Take 20 mg by mouth daily., Disp: , Rfl:  .  triamterene-hydrochlorothiazide (DYAZIDE) 37.5-25 MG capsule, Take 1 capsule by mouth daily. Take 1/2 tablet daily, Disp: , Rfl:  .  EPINEPHrine (EPIPEN 2-PAK) 0.3 mg/0.3 mL IJ SOAJ injection, Inject into the muscle once. Inject 0.3mg  intramuscular route once as needed., Disp: , Rfl:    Allergies  Allergen Reactions  . Codeine Nausea Only    unknown  . Tramadol Nausea Only     Review of Systems  Constitutional: Positive for diaphoresis. Negative for activity change, appetite change, chills, fatigue and fever.  HENT: Positive for congestion, postnasal drip, rhinorrhea and sinus pressure. Negative for ear discharge, ear pain, facial swelling, mouth sores, sinus  pain, sore throat and trouble swallowing.   Eyes: Negative for discharge.  Respiratory: Positive for cough and wheezing. Negative for chest tightness and shortness of breath.   Cardiovascular: Negative for chest pain.  Gastrointestinal: Negative for nausea and vomiting.  Musculoskeletal: Negative for arthralgias.  Skin: Negative for rash.  Neurological: Negative for dizziness and headaches.  Hematological: Negative for adenopathy.     Today's Vitals   01/11/18 1429  BP: 118/64  Pulse: 78  Temp: (!) 97.4 F (36.3 C)  TempSrc: Oral  SpO2: 90%  Weight: 174 lb 6.4 oz (79.1 kg)  Height: 5' (1.524 m)  PainSc: 0-No pain   Body mass index is 34.06 kg/m.   Objective:  Physical Exam  Constitutional: She is oriented to person, place, and time. She appears well-developed and well-nourished. No distress.  HENT:  Head: Normocephalic.  Right Ear: External ear normal.  Left Ear: External ear normal.  Nose: Nose normal.  Mouth/Throat: Oropharynx is clear and moist. No oropharyngeal exudate.  Both TMs normal  Eyes: Conjunctivae are normal. Right eye exhibits no discharge. Left eye exhibits no discharge. No scleral icterus.  Neck: Normal range of motion. No tracheal deviation present.  Cardiovascular: Normal rate, regular rhythm and normal heart sounds.  No murmur heard. Pulmonary/Chest: Effort normal. No respiratory distress.  Has mild expiratory wheezing on upper lungs  Lymphadenopathy:    She has no cervical adenopathy.  Neurological: She is alert and oriented to person, place, and time.  Skin: She is not diaphoretic.  Psychiatric: She has a normal mood and affect. Her behavior is normal. Judgment and thought content normal.  Nursing note and vitals reviewed.   Assessment And Plan:    1. Wheezing- acute, may have bronchitis vs pneumonia - ipratropium-albuterol (DUONEB) 0.5-2.5 (3) MG/3ML nebulizer solution 3 mL - DG Chest 2 View; Future - cefTRIAXone (ROCEPHIN) injection 1 g -  DG Chest 2 View; Future-  2. Pneumonia due to Pseudomonas species, unspecified laterality, unspecified part of lung (HCC)- I'm suspecting possible LUL pneumonia since the wheezing did not fully resolve after duo neb.  - cefTRIAXone (ROCEPHIN) injection 1 g  5;15 pm- her chest xray report came back neg for pneumonia. I called her back and told her she just has bronchitis and I Placed her on Doxycycline. Needs Fu if she gets worse.  Felton Buczynski RODRIG[place], PA-C

## 2018-01-12 ENCOUNTER — Encounter: Payer: Self-pay | Admitting: Internal Medicine

## 2018-01-18 ENCOUNTER — Other Ambulatory Visit: Payer: Self-pay | Admitting: Nurse Practitioner

## 2018-01-19 ENCOUNTER — Encounter: Payer: Self-pay | Admitting: Internal Medicine

## 2018-01-19 ENCOUNTER — Ambulatory Visit (INDEPENDENT_AMBULATORY_CARE_PROVIDER_SITE_OTHER): Payer: Medicare Other | Admitting: Internal Medicine

## 2018-01-19 VITALS — BP 150/70 | HR 72 | Temp 97.7°F | Ht 60.0 in | Wt 175.6 lb

## 2018-01-19 DIAGNOSIS — I950 Idiopathic hypotension: Secondary | ICD-10-CM

## 2018-01-19 DIAGNOSIS — J40 Bronchitis, not specified as acute or chronic: Secondary | ICD-10-CM

## 2018-01-19 DIAGNOSIS — I951 Orthostatic hypotension: Secondary | ICD-10-CM

## 2018-01-19 DIAGNOSIS — J209 Acute bronchitis, unspecified: Secondary | ICD-10-CM | POA: Diagnosis not present

## 2018-01-19 NOTE — Progress Notes (Signed)
Subjective:     Patient ID: Ashley Johns , female    DOB: Jun 30, 1935 , 81 y.o.   MRN: [id number]   Chief Complaint  Patient presents with  . Dizziness    HPI Pt has been feeling light headed when she gets up from sitting to staring position and was worse today. When I reviewed her meds she had 2 olmesartan meds, plus she was taking the Tenormine and Maxide. She admitted not drinking much today. She laso picked up a cold and has been having nose and chest congestion with wheezing.    Past Medical History:  Diagnosis Date  . Hypertension      Family History  Problem Relation Age of Onset  . Tuberculosis Mother   . Hypertension Father      Current Outpatient Medications:  .  albuterol (PROVENTIL HFA;VENTOLIN HFA) 108 (90 Base) MCG/ACT inhaler, Inhale 2 puffs into the lungs every 4 (four) hours as needed for wheezing or shortness of breath. Please educate how to use, Disp: 1 Inhaler, Rfl: 0 .  atenolol (TENORMIN) 50 MG tablet, Take 50 mg by mouth daily., Disp: , Rfl:  .  doxycycline (VIBRA-TABS) 100 MG tablet, Take 1 tablet (100 mg total) by mouth 2 (two) times daily., Disp: 20 tablet, Rfl: 0 .  EPINEPHrine (EPIPEN 2-PAK) 0.3 mg/0.3 mL IJ SOAJ injection, Inject into the muscle once. Inject 0.3mg  intramuscular route once as needed., Disp: , Rfl:  .  mometasone (ELOCON) 0.1 % cream, Apply 1 application topically daily., Disp: , Rfl:  .  olmesartan (BENICAR) 20 MG tablet, TAKE 1 TABLET BY MOUTH EVERY DAY, Disp: 90 tablet, Rfl: 0 .  OLMESARTAN MEDOXOMIL PO, Take 1 tablet by mouth once., Disp: , Rfl:  .  simvastatin (ZOCOR) 20 MG tablet, TAKE 1 TABLET BY MOUTH EVERY DAY, Disp: 90 tablet, Rfl: 0 .  triamterene-hydrochlorothiazide (DYAZIDE) 37.5-25 MG capsule, Take 1 capsule by mouth daily. Take 1/2 tablet daily, Disp: , Rfl:   Current Facility-Administered Medications:  .  ipratropium-albuterol (DUONEB) 0.5-2.5 (3) MG/3ML nebulizer solution 3 mL, 3 mL, Nebulization, Once,  Rodriguez-Southworth, Nettie Elm, PA-C   Allergies  Allergen Reactions  . Codeine Nausea Only    unknown  . Tramadol Nausea Only     Review of Systems  Constitutional: Positive for chills, diaphoresis and fatigue. Negative for appetite change and fever.  HENT: Positive for congestion, postnasal drip and rhinorrhea. Negative for ear discharge, ear pain, sore throat and trouble swallowing.   Respiratory: Positive for cough, chest tightness, shortness of breath and wheezing.   Cardiovascular: Negative for chest pain, palpitations and leg swelling.  Gastrointestinal: Negative for nausea and vomiting.  Musculoskeletal: Negative for myalgias.  Skin: Negative for rash.  Neurological: Negative for headaches.  Hematological: Negative for adenopathy.     Today's Vitals   01/19/18 1139 01/19/18 1143 01/19/18 1146  BP: (!) 150/70    Pulse: 72    Temp: 97.7 F (36.5 C)    TempSrc: Oral    SpO2: 92% 97% 96%  Weight: 175 lb 9.6 oz (79.7 kg)    Height: 5' (1.524 m)     Body mass index is 34.29 kg/m.   Objective:  Physical Exam Vitals signs and nursing note reviewed.  Constitutional:      General: She is not in acute distress.    Appearance: She is not ill-appearing.  HENT:     Head: Normocephalic.     Right Ear: Tympanic membrane, ear canal and external ear normal.  Left Ear: Tympanic membrane, ear canal and external ear normal.     Nose: Congestion and rhinorrhea present.     Mouth/Throat:     Mouth: Mucous membranes are moist.  Eyes:     General: No scleral icterus.       Right eye: No discharge.        Left eye: No discharge.     Extraocular Movements: Extraocular movements intact.     Conjunctiva/sclera: Conjunctivae normal.  Neck:     Musculoskeletal: Neck supple.     Vascular: No carotid bruit.  Cardiovascular:     Rate and Rhythm: Normal rate and regular rhythm.     Heart sounds: No murmur.  Pulmonary:     Effort: Pulmonary effort is normal.     Breath sounds:  Wheezing present. No rhonchi or rales.     Comments: Has diffuse wheezing which improved including pulse ox after duo neb treatment.  Musculoskeletal: Normal range of motion.  Lymphadenopathy:     Cervical: No cervical adenopathy.  Skin:    General: Skin is warm and dry.     Coloration: Skin is not jaundiced.     Findings: No erythema or rash.  Neurological:     General: No focal deficit present.     Mental Status: She is alert and oriented to person, place, and time.     Gait: Gait normal.  Psychiatric:        Mood and Affect: Mood normal.        Behavior: Behavior normal.        Thought Content: Thought content normal.        Judgment: Judgment normal.    Assessment And Plan:     1. Orthostatic hypotension- due to taking too much BP med. She was advised to not take Balsartan and the Maxide today, needs to push fluids. She was advised to go home and do BP diaries today and tomorrow and call us with her numbers and I will tell her if she should take those meds or not.  2. Bronchitis- acute. I placed her on Doxy and Proventil inhaler needs to be used q6h.   Needs to be seen again if she does not improve in 48h.  Analese Sovine RODRIGUEZ-SOUTHWORTH, PA-C

## 2018-01-19 NOTE — Patient Instructions (Addendum)
The only blood pressure medicine you should take is  Benecar Maxide Tenormin.  OK to take the Tenormin tomorrow am, but not the other 2. Check you blood pressure and call us with the number, and we will tell you if you should take them or not  My extension is 204   Hypotension As your heart beats, it forces blood through your body. This force is called blood pressure. If you have hypotension, you have low blood pressure. When your blood pressure is too low, you may not get enough blood to your brain. You may feel weak, feel light-headed, have a fast heartbeat, or even pass out (faint). Follow these instructions at home: Eating and drinking  Drink enough fluids to keep your pee (urine) clear or pale yellow.  Eat a healthy diet, and follow instructions from your doctor about eating or drinking restrictions. A healthy diet includes: ? Fresh fruits and vegetables. ? Whole grains. ? Low-fat (lean) meats. ? Low-fat dairy products.  Eat extra salt only as told. Do not add extra salt to your diet unless your doctor tells you to.  Eat small meals often.  Avoid standing up quickly after you eat. Medicines  Take over-the-counter and prescription medicines only as told by your doctor. ? Follow instructions from your doctor about changing how much you take (the dosage) of your medicines, if this applies. ? Do not stop or change your medicine on your own. General instructions  Wear compression stockings as told by your doctor.  Get up slowly from lying down or sitting.  Avoid hot showers and a lot of heat as told by your doctor.  Return to your normal activities as told by your doctor. Ask what activities are safe for you.  Do not use any products that contain nicotine or tobacco, such as cigarettes and e-cigarettes. If you need help quitting, ask your doctor.  Keep all follow-up visits as told by your doctor. This is important. Contact a doctor if:  You throw up (vomit).  You  have watery poop (diarrhea).  You have a fever for more than 2-3 days.  You feel more thirsty than normal.  You feel weak and tired. Get help right away if:  You have chest pain.  You have a fast or irregular heartbeat.  You lose feeling (get numbness) in any part of your body.  You cannot move your arms or your legs.  You have trouble talking.  You get sweaty or feel light-headed.  You faint.  You have trouble breathing.  You have trouble staying awake.  You feel confused. This information is not intended to replace advice given to you by your health care provider. Make sure you discuss any questions you have with your health care provider. Document Released: 04/22/2009 Document Revised: 10/15/2015 Document Reviewed: 10/15/2015 Elsevier Interactive Patient Education  2017 [employer]

## 2018-01-20 ENCOUNTER — Encounter: Payer: Self-pay | Admitting: Internal Medicine

## 2018-01-25 ENCOUNTER — Encounter: Payer: Self-pay | Admitting: Internal Medicine

## 2018-01-25 ENCOUNTER — Ambulatory Visit (INDEPENDENT_AMBULATORY_CARE_PROVIDER_SITE_OTHER): Payer: Medicare Other | Admitting: Internal Medicine

## 2018-01-25 VITALS — BP 120/70 | HR 73 | Temp 98.3°F | Ht 60.0 in | Wt 176.2 lb

## 2018-01-25 DIAGNOSIS — N289 Disorder of kidney and ureter, unspecified: Secondary | ICD-10-CM

## 2018-01-25 DIAGNOSIS — S39012A Strain of muscle, fascia and tendon of lower back, initial encounter: Secondary | ICD-10-CM | POA: Diagnosis not present

## 2018-01-25 MED ORDER — BACLOFEN 10 MG PO TABS: 5.0000 mg | ORAL_TABLET | Freq: Three times a day (TID) | ORAL | 0 refills | Status: DC

## 2018-01-25 NOTE — Patient Instructions (Signed)
Do not take the water pill, but continue the Tenormin and Benacar.   Come back in 2 weeks to check your kidney function

## 2018-01-25 NOTE — Progress Notes (Signed)
Subjective:     Patient ID: Ashley Johns , female    DOB: [birth year] , 82 y.o.   MRN: [id number]   Chief Complaint  Patient presents with  . Back Pain    put heating pad but it is still painful     HPI Pt is here due to having back pain with stiffness, provoked with walking. Has applied heat and applied tiger bomb which helped. She drank cranberry tarter this am since her daughter encouraged her to try this and now her back is better. Denies radiation of pain down her legs. Denies numbness or lower leg weakness. Denies injuring herself.  She also needs repeat creatinine.   Past Medical History:  Diagnosis Date  . Hypertension      Family History  Problem Relation Age of Onset  . Tuberculosis Mother   . Hypertension Father      Current Outpatient Medications:  .  albuterol (PROVENTIL HFA;VENTOLIN HFA) 108 (90 Base) MCG/ACT inhaler, Inhale 2 puffs into the lungs every 4 (four) hours as needed for wheezing or shortness of breath. Please educate how to use, Disp: 1 Inhaler, Rfl: 0 .  atenolol (TENORMIN) 50 MG tablet, Take 50 mg by mouth daily., Disp: , Rfl:  .  doxycycline (VIBRA-TABS) 100 MG tablet, Take 1 tablet (100 mg total) by mouth 2 (two) times daily., Disp: 20 tablet, Rfl: 0 .  EPINEPHrine (EPIPEN 2-PAK) 0.3 mg/0.3 mL IJ SOAJ injection, Inject into the muscle once. Inject 0.3mg  intramuscular route once as needed., Disp: , Rfl:  .  mometasone (ELOCON) 0.1 % cream, Apply 1 application topically daily., Disp: , Rfl:  .  olmesartan (BENICAR) 20 MG tablet, TAKE 1 TABLET BY MOUTH EVERY DAY, Disp: 90 tablet, Rfl: 0 .  simvastatin (ZOCOR) 20 MG tablet, TAKE 1 TABLET BY MOUTH EVERY DAY, Disp: 90 tablet, Rfl: 0 .  triamterene-hydrochlorothiazide (DYAZIDE) 37.5-25 MG capsule, Take 1 capsule by mouth daily. Take 1/2 tablet daily, Disp: , Rfl:   Current Facility-Administered Medications:  .  ipratropium-albuterol (DUONEB) 0.5-2.5 (3) MG/3ML nebulizer solution 3 mL, 3 mL, Nebulization,  Once, Rodriguez-Southworth, Nettie Elm, PA-C   Allergies  Allergen Reactions  . Codeine Nausea Only    unknown  . Tramadol Nausea Only     Review of Systems  Constitutional: Negative for appetite change, chills, diaphoresis and fever.  Gastrointestinal: Negative for abdominal pain.  Genitourinary: Negative for difficulty urinating, dysuria, flank pain, frequency, hematuria and urgency.  Musculoskeletal: Positive for back pain. Negative for gait problem.  Skin: Negative for rash.  Neurological: Negative for weakness and numbness.     Today's Vitals   01/25/18 1415  BP: 120/70  Pulse: 73  Temp: 98.3 F (36.8 C)  TempSrc: Oral  SpO2: 95%  Weight: 176 lb 3.2 oz (79.9 kg)  Height: 5' (1.524 m)   Body mass index is 34.41 kg/m.   Objective:  Physical Exam Vitals signs and nursing note reviewed.  Constitutional:      General: She is not in acute distress.    Appearance: Normal appearance. She is not toxic-appearing.  HENT:     Head: Normocephalic.     Right Ear: External ear normal.     Left Ear: External ear normal.     Nose: Nose normal.  Eyes:     General: No scleral icterus.    Conjunctiva/sclera: Conjunctivae normal.  Cardiovascular:     Rate and Rhythm: Normal rate and regular rhythm.     Heart sounds: No murmur.  Pulmonary:  Effort: Pulmonary effort is normal.     Breath sounds: Normal breath sounds.  Musculoskeletal:     Comments: BACK - has local tenderness across lower back with palpation of mostly soft tissue from L5-S1. Has neg SLR. Lateral flexion provoked pain on opposite side. Anterior flexion only 60 degrees, and raising back up also provoked pain.   Neurological:     General: No focal deficit present.     Mental Status: She is alert and oriented to person, place, and time.     Motor: No weakness.     Gait: Gait normal.     Deep Tendon Reflexes: Reflexes normal.  Psychiatric:        Mood and Affect: Mood normal.        Thought Content: Thought  content normal.        Judgment: Judgment normal.    Assessment And Plan:    1. Strain of lumbar region, initial encounter- acute - Ambulatory referral to Physical Therapy   I will have her alternate ice and heat 10 minutes at a time every 2h during the day and needs to not sleep on a heating pad.  2. Abnormal kidney function- needs FU.  - BMP8+Anion Gap; Future    Jimmy Stipes RODRIGUEZ-SOUTHWORTH, PA-C

## 2018-01-27 ENCOUNTER — Ambulatory Visit: Payer: Medicare Other | Attending: Internal Medicine | Admitting: Physical Therapy

## 2018-02-08 ENCOUNTER — Other Ambulatory Visit: Payer: Medicare Other

## 2018-02-08 DIAGNOSIS — N289 Disorder of kidney and ureter, unspecified: Secondary | ICD-10-CM

## 2018-02-09 LAB — BMP8+ANION GAP
Anion Gap: 20 mmol/L — ABNORMAL HIGH (ref 10.0–18.0)
BUN/Creatinine Ratio: 19 (ref 12–28)
BUN: 18 mg/dL (ref 8–27)
CO2: 19 mmol/L — ABNORMAL LOW (ref 20–29)
Calcium: 9.4 mg/dL (ref 8.7–10.3)
Chloride: 107 mmol/L — ABNORMAL HIGH (ref 96–106)
Creatinine, Ser: 0.96 mg/dL (ref 0.57–1.00)
GFR calc Af Amer: 65 mL/min/{1.73_m2} (ref >59)
GFR calc non Af Amer: 56 mL/min/{1.73_m2} — ABNORMAL LOW (ref >59)
GLUCOSE: 112 mg/dL — AB (ref 65–99)
Potassium: 4.6 mmol/L (ref 3.5–5.2)
Sodium: 146 mmol/L — ABNORMAL HIGH (ref 134–144)

## 2018-03-08 ENCOUNTER — Encounter: Payer: Self-pay | Admitting: Internal Medicine

## 2018-03-08 ENCOUNTER — Ambulatory Visit (INDEPENDENT_AMBULATORY_CARE_PROVIDER_SITE_OTHER): Payer: Medicare Other | Admitting: Internal Medicine

## 2018-03-08 VITALS — BP 128/82 | HR 71 | Temp 97.4°F | Ht 60.8 in | Wt 173.6 lb

## 2018-03-08 DIAGNOSIS — R05 Cough: Secondary | ICD-10-CM | POA: Diagnosis not present

## 2018-03-08 DIAGNOSIS — R059 Cough, unspecified: Secondary | ICD-10-CM

## 2018-03-08 DIAGNOSIS — I517 Cardiomegaly: Secondary | ICD-10-CM

## 2018-03-08 DIAGNOSIS — R062 Wheezing: Secondary | ICD-10-CM

## 2018-03-08 DIAGNOSIS — J301 Allergic rhinitis due to pollen: Secondary | ICD-10-CM

## 2018-03-08 MED ORDER — IPRATROPIUM-ALBUTEROL 0.5-2.5 (3) MG/3ML IN SOLN: 3.0000 mL | Freq: Once | RESPIRATORY_TRACT | Status: DC

## 2018-03-08 MED ORDER — BENZONATATE 100 MG PO CAPS: ORAL_CAPSULE | ORAL | 0 refills | Status: DC

## 2018-03-08 MED ORDER — CETIRIZINE HCL 10 MG PO TABS: 10.0000 mg | ORAL_TABLET | Freq: Every day | ORAL | 0 refills | Status: DC

## 2018-03-08 NOTE — Progress Notes (Signed)
Subjective:     Patient ID: Ashley Johns , female    DOB: 08-16-1935 , 83 y.o.   MRN: [id number]   Chief Complaint  Patient presents with  . URI    HPI Onset of sneezing, nose congestion and cough 1 week ago. She was seen last month for bronchitis and her cough had fully resolved. Had CXR 01/11/18 which showed cardiomegaly and mild peribronchial thickening-likely chronic.  She does not have a history of asthma or COPD per her records or that she is aware of this.    Past Medical History:  Diagnosis Date  . Hypertension      Family History  Problem Relation Age of Onset  . Tuberculosis Mother   . Hypertension Father      Current Outpatient Medications:  .  albuterol (PROVENTIL HFA;VENTOLIN HFA) 108 (90 Base) MCG/ACT inhaler, Inhale 2 puffs into the lungs every 4 (four) hours as needed for wheezing or shortness of breath. Please educate how to use, Disp: 1 Inhaler, Rfl: 0 .  atenolol (TENORMIN) 50 MG tablet, Take 50 mg by mouth daily., Disp: , Rfl:  .  baclofen (LIORESAL) 10 MG tablet, Take 0.5 tablets (5 mg total) by mouth 3 (three) times daily., Disp: 30 tablet, Rfl: 0 .  doxycycline (VIBRA-TABS) 100 MG tablet, Take 1 tablet (100 mg total) by mouth 2 (two) times daily., Disp: 20 tablet, Rfl: 0 .  EPINEPHrine (EPIPEN 2-PAK) 0.3 mg/0.3 mL IJ SOAJ injection, Inject into the muscle once. Inject 0.3mg  intramuscular route once as needed., Disp: , Rfl:  .  mometasone (ELOCON) 0.1 % cream, Apply 1 application topically daily., Disp: , Rfl:  .  olmesartan (BENICAR) 20 MG tablet, TAKE 1 TABLET BY MOUTH EVERY DAY, Disp: 90 tablet, Rfl: 0 .  simvastatin (ZOCOR) 20 MG tablet, TAKE 1 TABLET BY MOUTH EVERY DAY, Disp: 90 tablet, Rfl: 0 .  triamterene-hydrochlorothiazide (DYAZIDE) 37.5-25 MG capsule, Take 1 capsule by mouth daily. Take 1/2 tablet daily, Disp: , Rfl:   Current Facility-Administered Medications:  .  ipratropium-albuterol (DUONEB) 0.5-2.5 (3) MG/3ML nebulizer solution 3 mL, 3  mL, Nebulization, Once, Rodriguez-Southworth, Nettie Elm, PA-C   Allergies  Allergen Reactions  . Codeine Nausea Only    unknown  . Tramadol Nausea Only     Review of Systems  Constitutional: Positive for diaphoresis. Negative for chills, fatigue and fever.  HENT: Positive for congestion, hearing loss, postnasal drip, rhinorrhea and sneezing. Negative for ear discharge, ear pain, sinus pressure, sinus pain, sore throat and trouble swallowing.        Is supposed to get hearing aids  Eyes: Negative for discharge and itching.  Respiratory: Positive for cough. Negative for chest tightness and shortness of breath.   Cardiovascular: Negative for chest pain, palpitations and leg swelling.       Denies orthopnea or PND  Gastrointestinal: Negative for diarrhea, nausea and vomiting.  Musculoskeletal: Negative for myalgias.  Skin: Negative for rash.  Hematological: Negative for adenopathy.     Today's Vitals   03/08/18 1418  BP: 128/82  Pulse: 71  Temp: (!) 97.4 F (36.3 C)  TempSrc: Oral  SpO2: 96%  Weight: 173 lb 9.6 oz (78.7 kg)  Height: 5' 0.8" (1.544 m)  PainSc: 0-No pain   Body mass index is 33.02 kg/m.   Objective:  Physical Exam  Constitutional: She is oriented to person, place, and time. She appears well-developed and well-nourished. No distress.  HENT:  Head: Normocephalic and atraumatic.  Right Ear: External ear normal.  Left Ear: External ear normal.  Nose: Nose normal.  Eyes: Conjunctivae are normal. Right eye exhibits no discharge. Left eye exhibits no discharge. No scleral icterus.  Neck: Neck supple. No thyromegaly present.  No carotid bruits bilaterally  Cardiovascular: Normal rate and regular rhythm. No murmur heard. No edema.  Pulmonary/Chest: Effort normal and breath sounds normal. No respiratory distress. But her cough sounds wheezy.  Musculoskeletal: Normal range of motion. She exhibits no edema.  Lymphadenopathy: She has no cervical adenopathy.   Neurological: She is alert and oriented to person, place, and time.  Skin: Skin is warm and dry. Capillary refill takes less than 2 seconds. No rash noted. She is not diaphoretic.  Psychiatric: She has a normal mood and affect. Her behavior is normal. Judgment and thought content normal.  Nursing note reviewed.  Pulse ox after neb stayed at 95%.  Assessment And Plan:   1. Cough- chronic intermittent, seems RAD from allergies.  - ipratropium-albuterol (DUONEB) 0.5-2.5 (3) MG/3ML nebulizer solution 3 mL - ECHOCARDIOGRAM COMPLETE; Future - DG Chest 2 View; Future I would like her to repeat the CXR as FU bronchial thickening once she is well.  2. Non-seasonal allergic rhinitis due to pollen- acute, could be what is causing the cough. Advised to d/c home allergy med and try Zyrtec qd.  - cetirizine (ZYRTEC ALLERGY) 10 MG tablet; Take 1 tablet (10 mg total) by mouth daily.  Dispense: 90 tablet; Refill: 0  3. Cardiomegaly- found per CXR, but pt has no history of this. She does not recall having an echo, or ever seeing a cardiologist in the past.  - ECHOCARDIOGRAM COMPLETE; Future  FU as scheduled for HTN  Yavier Snider RODRIGUEZ-SOUTHWORTH, PA-C

## 2018-03-08 NOTE — Patient Instructions (Addendum)
I have ordered an echocardiogram which is an ultrasound of your heart since the chest xray showed your hart shadow looks enlarged.   Also have a chest xray follow up done next month at [facility] ones your cough is gone. Go any time M- F, 8:30a- 4:40p    Seems your cough is coming from allergies, so I want you to stop the current allergy medication and try Zyrtec every night for allergies and see if this prevents the sneezing and cough from coming back.   Your last chest xray shows enlargement of your heart and thickening in your breathing tubes which look chronic, so makes me wonder if you have something going on in your breathing passages.   If the allergy pill does not help preventing you from getting the sneezing and cough, then you may want to discuss with [facility] about seeing a lung specialist.

## 2018-03-28 ENCOUNTER — Other Ambulatory Visit (HOSPITAL_COMMUNITY): Payer: Medicare Other

## 2018-04-08 ENCOUNTER — Other Ambulatory Visit: Payer: Self-pay | Admitting: Nurse Practitioner

## 2018-04-18 ENCOUNTER — Other Ambulatory Visit: Payer: Self-pay | Admitting: Nurse Practitioner

## 2018-06-09 ENCOUNTER — Encounter: Payer: Self-pay | Admitting: Nurse Practitioner

## 2018-06-09 ENCOUNTER — Other Ambulatory Visit: Payer: Self-pay

## 2018-06-09 ENCOUNTER — Telehealth: Payer: Self-pay

## 2018-06-09 ENCOUNTER — Ambulatory Visit (INDEPENDENT_AMBULATORY_CARE_PROVIDER_SITE_OTHER): Payer: Medicare Other | Admitting: Nurse Practitioner

## 2018-06-09 DIAGNOSIS — R7303 Prediabetes: Secondary | ICD-10-CM | POA: Diagnosis not present

## 2018-06-09 DIAGNOSIS — I10 Essential (primary) hypertension: Secondary | ICD-10-CM | POA: Diagnosis not present

## 2018-06-09 DIAGNOSIS — N289 Disorder of kidney and ureter, unspecified: Secondary | ICD-10-CM | POA: Diagnosis not present

## 2018-06-09 MED ORDER — CETIRIZINE HCL 10 MG PO TABS: 10.0000 mg | ORAL_TABLET | Freq: Every day | ORAL | 1 refills | Status: DC

## 2018-06-09 NOTE — Progress Notes (Signed)
Virtual Visit via telephone   This visit type was conducted due to national recommendations for restrictions regarding the COVID-19 Pandemic (e.g. social distancing) in an effort to limit this patient's exposure and mitigate transmission in our community.  Patients identity confirmed using two different identifiers.  This format is felt to be most appropriate for this patient at this time.  All issues noted in this document were discussed and addressed.  No physical exam was performed (except for noted visual exam findings with Video Visits).    Date:  07/04/2018   ID:  Ashley LoreShirley C Johns, DOB [birth year], MRN [id number]  Patient Location:  Home - spoke with Ashley Johns  Provider location:   Office    Chief Complaint:  hypertension  History of Present Illness:    Ashley Johns is a 83 y.o. female who presents via video conferencing for a telehealth visit today.    The patient does not have symptoms concerning for COVID-19 infection (fever, chills, cough, or new shortness of breath).   HPI   Past Medical History:  Diagnosis Date  . Hypertension    History reviewed. No pertinent surgical history.   Current Meds  Medication Sig  . albuterol (PROVENTIL HFA;VENTOLIN HFA) 108 (90 Base) MCG/ACT inhaler Inhale 2 puffs into the lungs every 4 (four) hours as needed for wheezing or shortness of breath. Please educate how to use  . atenolol (TENORMIN) 50 MG tablet TAKE 1 TABLET BY MOUTH EVERY DAY  . baclofen (LIORESAL) 10 MG tablet Take 0.5 tablets (5 mg total) by mouth 3 (three) times daily.  Marland Kitchen. EPINEPHrine (EPIPEN 2-PAK) 0.3 mg/0.3 mL IJ SOAJ injection Inject into the muscle once. Inject 0.3mg  intramuscular route once as needed.  . mometasone (ELOCON) 0.1 % cream Apply 1 application topically daily.  Marland Kitchen. olmesartan (BENICAR) 20 MG tablet TAKE 1 TABLET BY MOUTH EVERY DAY  . simvastatin (ZOCOR) 20 MG tablet TAKE 1 TABLET BY MOUTH EVERY DAY  . triamterene-hydrochlorothiazide (DYAZIDE)  37.5-25 MG capsule Take 1 capsule by mouth daily. Take 1/2 tablet daily  . [DISCONTINUED] doxycycline (VIBRA-TABS) 100 MG tablet Take 1 tablet (100 mg total) by mouth 2 (two) times daily.   Current Facility-Administered Medications for the 06/09/18 encounter (Office Visit) with Arnette FeltsMoore, Gurley Climer, FNP  Medication  . ipratropium-albuterol (DUONEB) 0.5-2.5 (3) MG/3ML nebulizer solution 3 mL  . ipratropium-albuterol (DUONEB) 0.5-2.5 (3) MG/3ML nebulizer solution 3 mL     Allergies:   Codeine and Tramadol   Social History   Tobacco Use  . Smoking status: Never Smoker  . Smokeless tobacco: Never Used  Substance Use Topics  . Alcohol use: Never    Frequency: Never  . Drug use: Never     Family Hx: The patient's family history includes Hypertension in her father; Tuberculosis in her mother.  ROS:   Please see the history of present illness.    Review of Systems  Constitutional: Negative.   Respiratory: Negative.   Neurological: Negative.   Psychiatric/Behavioral: Negative.     All other systems reviewed and are negative.   Labs/Other Tests and Data Reviewed:    Recent Labs: 06/13/2018: BUN 15; Creatinine, Ser 0.97; Potassium 4.4; Sodium 143   Recent Lipid Panel No results found for: CHOL, TRIG, HDL, CHOLHDL, LDLCALC, LDLDIRECT  Wt Readings from Last 3 Encounters:  03/08/18 173 lb 9.6 oz (78.7 kg)  01/25/18 176 lb 3.2 oz (79.9 kg)  01/19/18 175 lb 9.6 oz (79.7 kg)     Exam:    Vital  Signs:  There were no vitals taken for this visit.    Physical Exam  ASSESSMENT & PLAN:     1. Essential hypertension . B/P is controlled.  . CMP ordered to check renal function.  . The importance of regular exercise and dietary modification was stressed to the patient.  . Stressed importance of losing ten percent of her body weight to help with B/P control.  . The weight loss would help with decreasing cardiac and cancer risk as well.    2. Prediabetes  Chronic, table  Continue with  current medications  Encouraged to limit intake of sugary foods and drinks  Encouraged to increase physical activity to 150 minutes per week as tolerated  3. Abnormal kidney function  Encouraged to stay well hydrated  Will check labs    COVID-19 Education: The signs and symptoms of COVID-19 were discussed with the patient and how to seek care for testing (follow up with PCP or arrange E-visit).  The importance of social distancing was discussed today.  Patient Risk:   After full review of this patients clinical status, I feel that they are at least moderate risk at this time.  Time:   Today, I have spent 11 minutes/ seconds with the patient with telehealth technology discussing above diagnoses.     Medication Adjustments/Labs and Tests Ordered: Current medicines are reviewed at length with the patient today.  Concerns regarding medicines are outlined above.   Tests Ordered: No orders of the defined types were placed in this encounter.   Medication Changes: Meds ordered this encounter  Medications  . cetirizine (ZYRTEC) 10 MG tablet    Sig: Take 1 tablet (10 mg total) by mouth daily.    Dispense:  90 tablet    Refill:  1    Disposition:  Follow up in 3 month(s)  Signed, Arnette Felts, FNP

## 2018-06-09 NOTE — Telephone Encounter (Signed)
Called to reschedule appt

## 2018-06-13 ENCOUNTER — Other Ambulatory Visit: Payer: Self-pay | Admitting: Nurse Practitioner

## 2018-06-13 ENCOUNTER — Other Ambulatory Visit: Payer: Self-pay

## 2018-06-13 ENCOUNTER — Other Ambulatory Visit: Payer: Medicare Other

## 2018-06-13 DIAGNOSIS — I10 Essential (primary) hypertension: Secondary | ICD-10-CM

## 2018-06-13 DIAGNOSIS — R7303 Prediabetes: Secondary | ICD-10-CM

## 2018-06-13 LAB — HEMOGLOBIN A1C
Est. average glucose Bld gHb Est-mCnc: 126 mg/dL
Hgb A1c MFr Bld: 6 % — ABNORMAL HIGH (ref 4.8–5.6)

## 2018-06-13 LAB — BMP8+EGFR
BUN/Creatinine Ratio: 15 (ref 12–28)
BUN: 15 mg/dL (ref 8–27)
CO2: 24 mmol/L (ref 20–29)
Calcium: 9.1 mg/dL (ref 8.7–10.3)
Chloride: 105 mmol/L (ref 96–106)
Creatinine, Ser: 0.97 mg/dL (ref 0.57–1.00)
GFR calc Af Amer: 63 mL/min/{1.73_m2} (ref >59)
GFR calc non Af Amer: 55 mL/min/{1.73_m2} — ABNORMAL LOW (ref >59)
Glucose: 104 mg/dL — ABNORMAL HIGH (ref 65–99)
Potassium: 4.4 mmol/L (ref 3.5–5.2)
Sodium: 143 mmol/L (ref 134–144)

## 2018-06-13 NOTE — Progress Notes (Signed)
Labs

## 2018-07-17 ENCOUNTER — Other Ambulatory Visit: Payer: Self-pay | Admitting: Nurse Practitioner

## 2018-07-31 ENCOUNTER — Other Ambulatory Visit: Payer: Self-pay | Admitting: Nurse Practitioner

## 2018-08-24 ENCOUNTER — Telehealth: Payer: Self-pay | Admitting: Nurse Practitioner

## 2018-08-24 NOTE — Telephone Encounter (Signed)
Patient granddaughter called states that her grandmother was bit by a spider on her eyelid.  Called patient back and she states she went to the eye doctor and he gave her some eye drops her eye was still swollen. Janece did not have any openings today 08/24/18 and recommended that the patient goes to the urgent care for treatment.

## 2018-09-06 ENCOUNTER — Telehealth: Payer: Self-pay

## 2018-09-06 NOTE — Telephone Encounter (Signed)
I called pt to notify her that her form has been filled out and she can come pick it up. YRL,RMA

## 2018-09-08 ENCOUNTER — Encounter: Payer: Self-pay | Admitting: Nurse Practitioner

## 2018-09-08 ENCOUNTER — Ambulatory Visit (INDEPENDENT_AMBULATORY_CARE_PROVIDER_SITE_OTHER): Payer: Medicare Other

## 2018-09-08 ENCOUNTER — Ambulatory Visit (INDEPENDENT_AMBULATORY_CARE_PROVIDER_SITE_OTHER): Payer: Medicare Other | Admitting: Nurse Practitioner

## 2018-09-08 ENCOUNTER — Other Ambulatory Visit: Payer: Self-pay

## 2018-09-08 VITALS — BP 142/78 | HR 70 | Temp 97.7°F | Ht 60.4 in | Wt 184.6 lb

## 2018-09-08 VITALS — BP 142/78 | HR 70 | Temp 97.7°F | Ht 60.4 in | Wt 184.0 lb

## 2018-09-08 DIAGNOSIS — Z0000 Encounter for general adult medical examination without abnormal findings: Secondary | ICD-10-CM

## 2018-09-08 DIAGNOSIS — Z23 Encounter for immunization: Secondary | ICD-10-CM | POA: Diagnosis not present

## 2018-09-08 DIAGNOSIS — I10 Essential (primary) hypertension: Secondary | ICD-10-CM

## 2018-09-08 DIAGNOSIS — R7303 Prediabetes: Secondary | ICD-10-CM

## 2018-09-08 DIAGNOSIS — H9193 Unspecified hearing loss, bilateral: Secondary | ICD-10-CM

## 2018-09-08 LAB — POCT URINALYSIS DIPSTICK
Bilirubin, UA: NEGATIVE
Blood, UA: NEGATIVE
Glucose, UA: NEGATIVE
Ketones, UA: NEGATIVE
Leukocytes, UA: NEGATIVE
Nitrite, UA: NEGATIVE
Protein, UA: NEGATIVE
Spec Grav, UA: 1.015 (ref 1.010–1.025)
Urobilinogen, UA: 0.2 E.U./dL
pH, UA: 6 (ref 5.0–8.0)

## 2018-09-08 LAB — POCT UA - MICROALBUMIN
Albumin/Creatinine Ratio, Urine, POC: <30
Creatinine, POC: 100 mg/dL
Microalbumin Ur, POC: 10 mg/L

## 2018-09-08 MED ORDER — BOOSTRIX 5-2.5-18.5 LF-MCG/0.5 IM SUSP: 0.5000 mL | Freq: Once | INTRAMUSCULAR | 0 refills | Status: AC

## 2018-09-08 MED ORDER — PREVNAR 13 IM SUSP: 0.5000 mL | INTRAMUSCULAR | 0 refills | Status: AC

## 2018-09-08 NOTE — Patient Instructions (Signed)
[name] , Thank you for taking time to come for your Medicare Wellness Visit. I appreciate your ongoing commitment to your health goals. Please review the following plan we discussed and let me know if I can assist you in the future.   Screening recommendations/referrals: Colonoscopy: not required Mammogram: not required Bone Density: 06/2002 Recommended yearly ophthalmology/optometry visit for glaucoma screening and checkup Recommended yearly dental visit for hygiene and checkup  Vaccinations: Influenza vaccine: 11/2017 Pneumococcal vaccine: sent to pharmacy Tdap vaccine: sent to pharmacy Shingles vaccine: discussed    Advanced directives: Please bring a copy of your POA (Power of Attorney) and/or Living Will to your next appointment.    Conditions/risks identified: obesity  Next appointment: 12/15/2018 at 11:30   Preventive Care 51 Years and Older, Female Preventive care refers to lifestyle choices and visits with your health care provider that can promote health and wellness. What does preventive care include?  A yearly physical exam. This is also called an annual well check.  Dental exams once or twice a year.  Routine eye exams. Ask your health care provider how often you should have your eyes checked.  Personal lifestyle choices, including:  Daily care of your teeth and gums.  Regular physical activity.  Eating a healthy diet.  Avoiding tobacco and drug use.  Limiting alcohol use.  Practicing safe sex.  Taking low-dose aspirin every day.  Taking vitamin and mineral supplements as recommended by your health care provider. What happens during an annual well check? The services and screenings done by your health care provider during your annual well check will depend on your age, overall health, lifestyle risk factors, and family history of disease. Counseling  Your health care provider may ask you questions about your:  Alcohol use.  Tobacco use.  Drug  use.  Emotional well-being.  Home and relationship well-being.  Sexual activity.  Eating habits.  History of falls.  Memory and ability to understand (cognition).  Work and work Statistician.  Reproductive health. Screening  You may have the following tests or measurements:  Height, weight, and BMI.  Blood pressure.  Lipid and cholesterol levels. These may be checked every 5 years, or more frequently if you are over 82 years old.  Skin check.  Lung cancer screening. You may have this screening every year starting at age 90 if you have a 30-pack-year history of smoking and currently smoke or have quit within the past 15 years.  Fecal occult blood test (FOBT) of the stool. You may have this test every year starting at age 82.  Flexible sigmoidoscopy or colonoscopy. You may have a sigmoidoscopy every 5 years or a colonoscopy every 10 years starting at age 8.  Hepatitis C blood test.  Hepatitis B blood test.  Sexually transmitted disease (STD) testing.  Diabetes screening. This is done by checking your blood sugar (glucose) after you have not eaten for a while (fasting). You may have this done every 1-3 years.  Bone density scan. This is done to screen for osteoporosis. You may have this done starting at age 10.  Mammogram. This may be done every 1-2 years. Talk to your health care provider about how often you should have regular mammograms. Talk with your health care provider about your test results, treatment options, and if necessary, the need for more tests. Vaccines  Your health care provider may recommend certain vaccines, such as:  Influenza vaccine. This is recommended every year.  Tetanus, diphtheria, and acellular pertussis (Tdap, Td) vaccine.  You may need a Td booster every 10 years.  Zoster vaccine. You may need this after age 4.  Pneumococcal 13-valent conjugate (PCV13) vaccine. One dose is recommended after age 71.  Pneumococcal polysaccharide  (PPSV23) vaccine. One dose is recommended after age 55. Talk to your health care provider about which screenings and vaccines you need and how often you need them. This information is not intended to replace advice given to you by your health care provider. Make sure you discuss any questions you have with your health care provider. Document Released: 02/22/2015 Document Revised: 10/16/2015 Document Reviewed: 11/27/2014 Elsevier Interactive Patient Education  2017 [place] Prevention in the Home Falls can cause injuries. They can happen to people of all ages. There are many things you can do to make your home safe and to help prevent falls. What can I do on the outside of my home?  Regularly fix the edges of walkways and driveways and fix any cracks.  Remove anything that might make you trip as you walk through a door, such as a raised step or threshold.  Trim any bushes or trees on the path to your home.  Use bright outdoor lighting.  Clear any walking paths of anything that might make someone trip, such as rocks or tools.  Regularly check to see if handrails are loose or broken. Make sure that both sides of any steps have handrails.  Any raised decks and porches should have guardrails on the edges.  Have any leaves, snow, or ice cleared regularly.  Use sand or salt on walking paths during winter.  Clean up any spills in your garage right away. This includes oil or grease spills. What can I do in the bathroom?  Use night lights.  Install grab bars by the toilet and in the tub and shower. Do not use towel bars as grab bars.  Use non-skid mats or decals in the tub or shower.  If you need to sit down in the shower, use a plastic, non-slip stool.  Keep the floor dry. Clean up any water that spills on the floor as soon as it happens.  Remove soap buildup in the tub or shower regularly.  Attach bath mats securely with double-sided non-slip rug tape.  Do not have  throw rugs and other things on the floor that can make you trip. What can I do in the bedroom?  Use night lights.  Make sure that you have a light by your bed that is easy to reach.  Do not use any sheets or blankets that are too big for your bed. They should not hang down onto the floor.  Have a firm chair that has side arms. You can use this for support while you get dressed.  Do not have throw rugs and other things on the floor that can make you trip. What can I do in the kitchen?  Clean up any spills right away.  Avoid walking on wet floors.  Keep items that you use a lot in easy-to-reach places.  If you need to reach something above you, use a strong step stool that has a grab bar.  Keep electrical cords out of the way.  Do not use floor polish or wax that makes floors slippery. If you must use wax, use non-skid floor wax.  Do not have throw rugs and other things on the floor that can make you trip. What can I do with my stairs?  Do not leave any  items on the stairs.  Make sure that there are handrails on both sides of the stairs and use them. Fix handrails that are broken or loose. Make sure that handrails are as long as the stairways.  Check any carpeting to make sure that it is firmly attached to the stairs. Fix any carpet that is loose or worn.  Avoid having throw rugs at the top or bottom of the stairs. If you do have throw rugs, attach them to the floor with carpet tape.  Make sure that you have a light switch at the top of the stairs and the bottom of the stairs. If you do not have them, ask someone to add them for you. What else can I do to help prevent falls?  Wear shoes that:  Do not have high heels.  Have rubber bottoms.  Are comfortable and fit you well.  Are closed at the toe. Do not wear sandals.  If you use a stepladder:  Make sure that it is fully opened. Do not climb a closed stepladder.  Make sure that both sides of the stepladder are  locked into place.  Ask someone to hold it for you, if possible.  Clearly mark and make sure that you can see:  Any grab bars or handrails.  First and last steps.  Where the edge of each step is.  Use tools that help you move around (mobility aids) if they are needed. These include:  Canes.  Walkers.  Scooters.  Crutches.  Turn on the lights when you go into a dark area. Replace any light bulbs as soon as they burn out.  Set up your furniture so you have a clear path. Avoid moving your furniture around.  If any of your floors are uneven, fix them.  If there are any pets around you, be aware of where they are.  Review your medicines with your doctor. Some medicines can make you feel dizzy. This can increase your chance of falling. Ask your doctor what other things that you can do to help prevent falls. This information is not intended to replace advice given to you by your health care provider. Make sure you discuss any questions you have with your health care provider. Document Released: 11/22/2008 Document Revised: 07/04/2015 Document Reviewed: 03/02/2014 Elsevier Interactive Patient Education  2017 [employer].

## 2018-09-08 NOTE — Progress Notes (Signed)
Subjective:     Patient ID: Ashley Johns , female    DOB: [birth year] , 83 y.o.   MRN: [id number]   Chief Complaint  Patient presents with  . 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    Hypertension This is a chronic problem. The current episode started more than 1 year ago. The problem is controlled. Pertinent negatives include no anxiety, chest pain, headaches, neck pain or palpitations. There are no associated agents to hypertension. Risk factors for coronary artery disease include obesity and sedentary lifestyle.     Past Medical History:  Diagnosis Date  . Hypertension      Family History  Problem Relation Age of Onset  . Tuberculosis Mother   . Hypertension Father      Current Outpatient Medications:  .  albuterol (PROVENTIL HFA;VENTOLIN HFA) 108 (90 Base) MCG/ACT inhaler, Inhale 2 puffs into the lungs every 4 (four) hours as needed for wheezing or shortness of breath. Please educate how to use, Disp: 1 Inhaler, Rfl: 0 .  atenolol (TENORMIN) 50 MG tablet, TAKE 1 TABLET BY MOUTH EVERY DAY, Disp: 90 tablet, Rfl: 0 .  baclofen (LIORESAL) 10 MG tablet, Take 0.5 tablets (5 mg total) by mouth 3 (three) times daily., Disp: 30 tablet, Rfl: 0 .  cetirizine (ZYRTEC) 10 MG tablet, Take 1 tablet (10 mg total) by mouth daily., Disp: 90 tablet, Rfl: 1 .  EPINEPHrine (EPIPEN 2-PAK) 0.3 mg/0.3 mL IJ SOAJ injection, Inject into the muscle once. Inject 0.3mg  intramuscular route once as needed., Disp: , Rfl:  .  mometasone (ELOCON) 0.1 % cream, Apply 1 application topically daily., Disp: , Rfl:  .  olmesartan (BENICAR) 20 MG tablet, TAKE 1 TABLET BY MOUTH EVERY DAY, Disp: 90 tablet, Rfl: 0 .  pneumococcal 13-valent conjugate vaccine (PREVNAR 13) SUSP injection, Inject 0.5 mLs into the muscle tomorrow at 10 am for 1 dose., Disp: 0.5 mL, Rfl: 0 .  simvastatin (ZOCOR) 20 MG tablet, TAKE 1 TABLET BY MOUTH  EVERY DAY, Disp: 90 tablet, Rfl: 0 .  Tdap (BOOSTRIX) 5-2.5-18.5 LF-MCG/0.5 injection, Inject 0.5 mLs into the muscle once for 1 dose., Disp: 0.5 mL, Rfl: 0 .  triamterene-hydrochlorothiazide (DYAZIDE) 37.5-25 MG capsule, Take 1 capsule by mouth daily. Take 1/2 tablet daily, Disp: , Rfl:  .  triamterene-hydrochlorothiazide (MAXZIDE-25) 37.5-25 MG tablet, TAKE 1/2 TABLET BY MOUTH EVERY DAY, Disp: 90 tablet, Rfl: 0  Current Facility-Administered Medications:  .  ipratropium-albuterol (DUONEB) 0.5-2.5 (3) MG/3ML nebulizer solution 3 mL, 3 mL, Nebulization, Once, Rodriguez-Southworth, Sylvia, PA-C .  ipratropium-albuterol (DUONEB) 0.5-2.5 (3) MG/3ML nebulizer solution 3 mL, 3 mL, Nebulization, Once, Rodriguez-Southworth, Sunday Spillers, PA-C   Allergies  Allergen Reactions  . Codeine Nausea Only    unknown  . Tramadol Nausea Only     Review of Systems  Constitutional: Negative.   Respiratory: Negative.   Cardiovascular: Negative.  Negative for chest pain, palpitations and leg swelling.  Musculoskeletal: Negative for arthralgias and neck pain.  Neurological: Negative for dizziness and headaches.     Today's Vitals   09/08/18 1152  BP: (!) 142/78  Pulse: 70  Temp: 97.7 F (36.5 C)  TempSrc: Oral  Weight: 184 lb (83.5 kg)  Height: 5' 0.4" (1.534 m)   Body mass index is 35.46 kg/m.   Objective:  Physical Exam Constitutional:      Appearance: Normal appearance.  Cardiovascular:  Rate and Rhythm: Normal rate and regular rhythm.     Pulses: Normal pulses.     Heart sounds: No murmur.  Pulmonary:     Effort: Pulmonary effort is normal.     Breath sounds: Normal breath sounds.  Musculoskeletal: Normal range of motion.  Skin:    General: Skin is warm and dry.     Capillary Refill: Capillary refill takes less than 2 seconds.  Neurological:     General: No focal deficit present.     Mental Status: She is alert and oriented to person, place, and time.         Assessment And Plan:      1. Essential hypertension . B/P is controlled.  . CMP ordered to check renal function.  . The importance of regular exercise and dietary modification was stressed to the patient.   2. Prediabetes  Chronic, stable  No current medications  Encouraged to limit intake of sugary foods and drinks  Encouraged to increase physical activity to 150 minutes per week  3. Bilateral hearing loss, unspecified hearing loss type  She would like a referral to a provider who services hearing aids at low cost   Will reach out to Ashley Johns (SW) who may know of some providers  Ashley FeltsJanece Juanell Saffo, FNP    THE PATIENT IS ENCOURAGED TO PRACTICE SOCIAL DISTANCING DUE TO THE COVID-19 PANDEMIC.

## 2018-09-08 NOTE — Progress Notes (Signed)
Subjective:   Ashley Johns is a 83 y.o. female who presents for Medicare Annual (Subsequent) preventive examination.  Review of Systems:  n/a Cardiac Risk Factors include: advanced age (>83men, >49 women);hypertension;obesity (BMI >30kg/m2)     Objective:     Vitals: BP (!) 142/78 (BP Location: Left Arm, Patient Position: Sitting, Cuff Size: Normal)   Pulse 70   Temp 97.7 F (36.5 C) (Oral)   Ht 5' 0.4" (1.534 m)   Wt 184 lb 9.6 oz (83.7 kg)   SpO2 98%   BMI 35.58 kg/m   Body mass index is 35.58 kg/m.  Advanced Directives 09/08/2018 12/09/2017  Does Patient Have a Medical Advance Directive? Yes Yes  Type of Paramedic of [place];Living will [place];Living will  Does patient want to make changes to medical advance directive? No - Patient declined No - Patient declined  Copy of [place] in Chart? No - copy requested No - copy requested    Tobacco Social History   Tobacco Use  Smoking Status Never Smoker  Smokeless Tobacco Never Used     Counseling given: Not Answered   Clinical Intake:  Pre-visit preparation completed: Yes  Pain : No/denies pain     Nutritional Status: BMI > 30  Obese Nutritional Risks: None Diabetes: No  How often do you need to have someone help you when you read instructions, pamphlets, or other written materials from your doctor or pharmacy?: 1 - Never What is the last grade level you completed in school?: 2 years college  Interpreter Needed?: No  Information entered by :: NAllen LPN  Past Medical History:  Diagnosis Date  . Hypertension    Past Surgical History:  Procedure Laterality Date  . TUBAL LIGATION     Family History  Problem Relation Age of Onset  . Tuberculosis Mother   . Hypertension Father    Social History   Socioeconomic History  . Marital status: Married    Spouse name: Not on file  . Number of children: Not on file  . Years of  education: Not on file  . Highest education level: Not on file  Occupational History  . Not on file  Social Needs  . Financial resource strain: Not hard at all  . Food insecurity    Worry: Never true    Inability: Never true  . Transportation needs    Medical: No    Non-medical: No  Tobacco Use  . Smoking status: Never Smoker  . Smokeless tobacco: Never Used  Substance and Sexual Activity  . Alcohol use: Never    Frequency: Never  . Drug use: Never  . Sexual activity: Not Currently  Lifestyle  . Physical activity    Days per week: 1 day    Minutes per session: 30 min  . Stress: Not at all  Relationships  . Social Herbalist on phone: Not on file    Gets together: Not on file    Attends religious service: Not on file    Active member of club or organization: Not on file    Attends meetings of clubs or organizations: Not on file    Relationship status: Not on file  Other Topics Concern  . Not on file  Social History Narrative  . Not on file    Outpatient Encounter Medications as of 09/08/2018  Medication Sig  . albuterol (PROVENTIL HFA;VENTOLIN HFA) 108 (90 Base) MCG/ACT inhaler Inhale 2 puffs  into the lungs every 4 (four) hours as needed for wheezing or shortness of breath. Please educate how to use  . atenolol (TENORMIN) 50 MG tablet TAKE 1 TABLET BY MOUTH EVERY DAY  . baclofen (LIORESAL) 10 MG tablet Take 0.5 tablets (5 mg total) by mouth 3 (three) times daily.  . cetirizine (ZYRTEC) 10 MG tablet Take 1 tablet (10 mg total) by mouth daily.  Marland Kitchen. EPINEPHrine (EPIPEN 2-PAK) 0.3 mg/0.3 mL IJ SOAJ injection Inject into the muscle once. Inject 0.3mg  intramuscular route once as needed.  . mometasone (ELOCON) 0.1 % cream Apply 1 application topically daily.  Marland Kitchen. olmesartan (BENICAR) 20 MG tablet TAKE 1 TABLET BY MOUTH EVERY DAY  . simvastatin (ZOCOR) 20 MG tablet TAKE 1 TABLET BY MOUTH EVERY DAY  . triamterene-hydrochlorothiazide (DYAZIDE) 37.5-25 MG capsule Take 1  capsule by mouth daily. Take 1/2 tablet daily  . triamterene-hydrochlorothiazide (MAXZIDE-25) 37.5-25 MG tablet TAKE 1/2 TABLET BY MOUTH EVERY DAY   Facility-Administered Encounter Medications as of 09/08/2018  Medication  . ipratropium-albuterol (DUONEB) 0.5-2.5 (3) MG/3ML nebulizer solution 3 mL  . ipratropium-albuterol (DUONEB) 0.5-2.5 (3) MG/3ML nebulizer solution 3 mL    Activities of Daily Living In your present state of health, do you have any difficulty performing the following activities: 09/08/2018 12/09/2017  Hearing? Ashley JohnsY Y  Comment needs a hearing aide getting hearing aid for left ear  Vision? N Y  Difficulty concentrating or making decisions? N N  Walking or climbing stairs? N N  Dressing or bathing? N N  Doing errands, shopping? N N  Preparing Food and eating ? N N  Using the Toilet? N N  In the past six months, have you accidently leaked urine? N Y  Comment - wears depends  Do you have problems with loss of bowel control? N N  Managing your Medications? N N  Managing your Finances? N N  Housekeeping or managing your Housekeeping? N N  Some recent data might be hidden    Patient Care Team: Arnette FeltsMoore, Janece, FNP as PCP - General (General Practice)    Assessment:   This is a routine wellness examination for Ashley Johns.  Exercise Activities and Dietary recommendations Current Exercise Habits: Home exercise routine, Time (Minutes): 30, Frequency (Times/Week): 1, Weekly Exercise (Minutes/Week): 30  Goals    . DIET - INCREASE WATER INTAKE (pt-stated)     Also would like to decrease salt intake    . Patient Stated     09/08/2018, wants to increase exercise back to 2 days a week       Fall Risk Fall Risk  09/08/2018 06/09/2018 03/08/2018 01/11/2018 12/09/2017  Falls in the past year? 0 0 0 0 No  Risk for fall due to : Medication side effect - - - Medication side effect  Follow up Falls evaluation completed;Education provided;Falls prevention discussed - - - -   Is the  patient's home free of loose throw rugs in walkways, pet beds, electrical cords, etc?   yes      Grab bars in the bathroom? no      Handrails on the stairs?   yes      Adequate lighting?   yes  Timed Get Up and Go performed: n/a  Depression Screen PHQ 2/9 Scores 09/08/2018 06/09/2018 03/08/2018 01/11/2018  PHQ - 2 Score 0 0 0 0  PHQ- 9 Score 0 - - -     Cognitive Function     6CIT Screen 09/08/2018 12/09/2017  What Year? 0 points 0 points  What month? 0 points 0 points  What time? 0 points 0 points  Count back from 20 0 points 0 points  Months in reverse 4 points 4 points  Repeat phrase 0 points 0 points  Total Score 4 4    Immunization History  Administered Date(s) Administered  . Influenza-Unspecified 11/16/2017    Qualifies for Shingles Vaccine? yes  Screening Tests Health Maintenance  Topic Date Due  . TETANUS/TDAP  03/08/1956  . PNA vac Low Risk Adult (1 of 2 - PCV13) 03/08/2002  . INFLUENZA VACCINE  09/10/2018  . DEXA SCAN  Completed    Cancer Screenings: Lung: Low Dose CT Chest recommended if Age 7-80 years, 30 pack-year currently smoking OR have quit w/in 15years. Patient does not qualify. Breast:  Up to date on Mammogram? Yes   Up to date of Bone Density/Dexa? Yes Colorectal: not required  Additional Screenings: : Hepatitis C Screening: n/a     Plan:    6 CIT was 4. Wants to restart exercising 2 days a week.   I have personally reviewed and noted the following in the patient's chart:   . Medical and social history . Use of alcohol, tobacco or illicit drugs  . Current medications and supplements . Functional ability and status . Nutritional status . Physical activity . Advanced directives . List of other physicians . Hospitalizations, surgeries, and ER visits in previous 12 months . Vitals . Screenings to include cognitive, depression, and falls . Referrals and appointments  In addition, I have reviewed and discussed with patient certain  preventive protocols, quality metrics, and best practice recommendations. A written personalized care plan for preventive services as well as general preventive health recommendations were provided to patient.     Barb Merinoickeah E Dreya Buhrman, LPN  8/11/91477/30/2020

## 2018-09-09 ENCOUNTER — Telehealth: Payer: Self-pay

## 2018-09-09 NOTE — Telephone Encounter (Signed)
09/09/2018 Spoke with patient about [facility] and [place] she has memberships at both.  Also gave her the number for [facility] they may cover part of the cost they have to speak to the cardholder. Ambrose Mantle [phone]

## 2018-09-15 ENCOUNTER — Telehealth: Payer: Self-pay

## 2018-09-15 NOTE — Telephone Encounter (Signed)
09/15/2018 Left message on voicemail to return my call. Following up with patient about her hearing aids. Ambrose Mantle [phone]

## 2018-09-26 ENCOUNTER — Telehealth: Payer: Self-pay

## 2018-09-26 NOTE — Telephone Encounter (Signed)
09/26/2018 Spoke with patient about hearing aids.  Patient stated she has an appointment scheduled for next week to get her hearing aids.  Patient has no other needs at present. Closing referral. Ambrose Mantle [phone]

## 2018-10-13 ENCOUNTER — Other Ambulatory Visit: Payer: Self-pay

## 2018-10-13 MED ORDER — CETIRIZINE HCL 10 MG PO TABS: 10.0000 mg | ORAL_TABLET | Freq: Every day | ORAL | 0 refills | Status: DC

## 2018-10-15 ENCOUNTER — Other Ambulatory Visit: Payer: Self-pay | Admitting: Nurse Practitioner

## 2018-10-24 ENCOUNTER — Other Ambulatory Visit: Payer: Self-pay | Admitting: Nurse Practitioner

## 2018-11-03 DIAGNOSIS — H905 Unspecified sensorineural hearing loss: Secondary | ICD-10-CM | POA: Diagnosis not present

## 2018-11-08 ENCOUNTER — Other Ambulatory Visit: Payer: Self-pay | Admitting: Nurse Practitioner

## 2018-11-14 ENCOUNTER — Encounter: Payer: Self-pay | Admitting: Nurse Practitioner

## 2018-11-14 ENCOUNTER — Other Ambulatory Visit: Payer: Self-pay

## 2018-11-14 ENCOUNTER — Ambulatory Visit (INDEPENDENT_AMBULATORY_CARE_PROVIDER_SITE_OTHER): Payer: Medicare Other | Admitting: Nurse Practitioner

## 2018-11-14 VITALS — BP 118/60 | HR 67 | Temp 98.0°F | Ht 60.4 in | Wt 185.6 lb

## 2018-11-14 DIAGNOSIS — S00262A Insect bite (nonvenomous) of left eyelid and periocular area, initial encounter: Secondary | ICD-10-CM | POA: Diagnosis not present

## 2018-11-14 DIAGNOSIS — W57XXXA Bitten or stung by nonvenomous insect and other nonvenomous arthropods, initial encounter: Secondary | ICD-10-CM | POA: Diagnosis not present

## 2018-11-14 DIAGNOSIS — S90861A Insect bite (nonvenomous), right foot, initial encounter: Secondary | ICD-10-CM

## 2018-11-14 DIAGNOSIS — S70361A Insect bite (nonvenomous), right thigh, initial encounter: Secondary | ICD-10-CM | POA: Diagnosis not present

## 2018-11-14 MED ORDER — MOMETASONE FUROATE 0.1 % EX CREA: TOPICAL_CREAM | Freq: Two times a day (BID) | CUTANEOUS | 0 refills | Status: DC

## 2018-11-14 MED ORDER — CEPHALEXIN 250 MG PO CAPS: 250.0000 mg | ORAL_CAPSULE | Freq: Four times a day (QID) | ORAL | 0 refills | Status: AC

## 2018-11-14 NOTE — Progress Notes (Signed)
Subjective:     Patient ID: Ashley Johns , female    DOB: [birth year] , 83 y.o.   MRN: [id number]   Chief Complaint  Patient presents with  . Insect Bite    patient has a couple bites on her and they are itchy     HPI  Insect bite to her left eye, seen by Dr. Laverna Peace given antibiotic eye drop.    Right thigh with scabbed bite x 1 week ago.  Right foot also has areas scabbed.      Past Medical History:  Diagnosis Date  . Hypertension      Family History  Problem Relation Age of Onset  . Tuberculosis Mother   . Hypertension Father      Current Outpatient Medications:  .  albuterol (PROVENTIL HFA;VENTOLIN HFA) 108 (90 Base) MCG/ACT inhaler, Inhale 2 puffs into the lungs every 4 (four) hours as needed for wheezing or shortness of breath. Please educate how to use, Disp: 1 Inhaler, Rfl: 0 .  atenolol (TENORMIN) 50 MG tablet, TAKE 1 TABLET BY MOUTH EVERY DAY, Disp: 90 tablet, Rfl: 0 .  baclofen (LIORESAL) 10 MG tablet, Take 0.5 tablets (5 mg total) by mouth 3 (three) times daily., Disp: 30 tablet, Rfl: 0 .  cetirizine (ZYRTEC) 10 MG tablet, Take 1 tablet (10 mg total) by mouth daily., Disp: 90 tablet, Rfl: 0 .  EPINEPHrine (EPIPEN 2-PAK) 0.3 mg/0.3 mL IJ SOAJ injection, Inject into the muscle once. Inject 0.3mg  intramuscular route once as needed., Disp: , Rfl:  .  mometasone (ELOCON) 0.1 % cream, APPLY TO THE AFFECTED AREA TWICE DAILY, Disp: 45 g, Rfl: 0 .  olmesartan (BENICAR) 20 MG tablet, TAKE 1 TABLET BY MOUTH EVERY DAY, Disp: 90 tablet, Rfl: 0 .  simvastatin (ZOCOR) 20 MG tablet, TAKE 1 TABLET BY MOUTH EVERY DAY, Disp: 90 tablet, Rfl: 0 .  triamterene-hydrochlorothiazide (DYAZIDE) 37.5-25 MG capsule, Take 1 capsule by mouth daily. Take 1/2 tablet daily, Disp: , Rfl:  .  triamterene-hydrochlorothiazide (MAXZIDE-25) 37.5-25 MG tablet, TAKE 1/2 TABLET BY MOUTH EVERY DAY, Disp: 90 tablet, Rfl: 0  Current Facility-Administered Medications:  .  ipratropium-albuterol (DUONEB)  0.5-2.5 (3) MG/3ML nebulizer solution 3 mL, 3 mL, Nebulization, Once, Rodriguez-Southworth, Sylvia, PA-C .  ipratropium-albuterol (DUONEB) 0.5-2.5 (3) MG/3ML nebulizer solution 3 mL, 3 mL, Nebulization, Once, Rodriguez-Southworth, Sunday Spillers, PA-C   Allergies  Allergen Reactions  . Codeine Nausea Only    unknown  . Tramadol Nausea Only     Review of Systems  Constitutional: Negative.   Respiratory: Negative.   Cardiovascular: Negative.  Negative for chest pain, palpitations and leg swelling.  Skin: Positive for rash.  Neurological: Negative for dizziness and headaches.     Today's Vitals   11/14/18 1511  BP: 118/60  Pulse: 67  Temp: 98 F (36.7 C)  TempSrc: Oral  Weight: 185 lb 9.6 oz (84.2 kg)  Height: 5' 0.4" (1.534 m)  PainSc: 0-No pain   Body mass index is 35.77 kg/m.   Objective:  Physical Exam Constitutional:      Appearance: Normal appearance.  Cardiovascular:     Rate and Rhythm: Normal rate and regular rhythm.  Skin:    General: Skin is warm.     Capillary Refill: Capillary refill takes less than 2 seconds.     Findings: Rash (right posterior thigh, right anterior foot and left medial forearm) present.  Neurological:     General: No focal deficit present.     Mental Status: She is  alert.  Psychiatric:        Mood and Affect: Mood normal.        Behavior: Behavior normal.        Thought Content: Thought content normal.        Judgment: Judgment normal.         Assessment And Plan:     1. Insect bite, unspecified site, initial encounter  She is to return call to office if not better  May use cool compresses to area - cephALEXin (KEFLEX) 250 MG capsule; Take 1 capsule (250 mg total) by mouth 4 (four) times daily for 10 days.  Dispense: 40 capsule; Refill: 0 - mometasone (ELOCON) 0.1 % cream; Apply topically 2 (two) times daily. to affected area  Dispense: 45 g; Refill: 0   Arnette Felts, FNP    THE PATIENT IS ENCOURAGED TO PRACTICE SOCIAL  DISTANCING DUE TO THE COVID-19 PANDEMIC.

## 2018-12-05 ENCOUNTER — Other Ambulatory Visit: Payer: Self-pay

## 2018-12-05 ENCOUNTER — Encounter: Payer: Self-pay | Admitting: Nurse Practitioner

## 2018-12-05 ENCOUNTER — Ambulatory Visit (INDEPENDENT_AMBULATORY_CARE_PROVIDER_SITE_OTHER): Payer: Medicare Other | Admitting: Nurse Practitioner

## 2018-12-05 VITALS — BP 132/80 | HR 65 | Temp 98.3°F | Ht 60.4 in | Wt 186.0 lb

## 2018-12-05 DIAGNOSIS — R21 Rash and other nonspecific skin eruption: Secondary | ICD-10-CM | POA: Diagnosis not present

## 2018-12-05 MED ORDER — TRIAMCINOLONE ACETONIDE 40 MG/ML IJ SUSP
40.0000 mg | Freq: Once | INTRAMUSCULAR | Status: AC
  Administered 2018-12-05: 40 mg via INTRAMUSCULAR

## 2018-12-05 NOTE — Progress Notes (Signed)
Subjective:     Patient ID: Ashley Johns , female    DOB: 17-Aug-1935 , 83 y.o.   MRN: [id number]   Chief Complaint  Patient presents with  . Rash    patient stated she has some red spots on her skin     HPI  Rash This is a new problem. The current episode started in the past 7 days. Location: bilateral hands with red rash, and arms.       Past Medical History:  Diagnosis Date  . Hypertension      Family History  Problem Relation Age of Onset  . Tuberculosis Mother   . Hypertension Father      Current Outpatient Medications:  .  albuterol (PROVENTIL HFA;VENTOLIN HFA) 108 (90 Base) MCG/ACT inhaler, Inhale 2 puffs into the lungs every 4 (four) hours as needed for wheezing or shortness of breath. Please educate how to use, Disp: 1 Inhaler, Rfl: 0 .  atenolol (TENORMIN) 50 MG tablet, TAKE 1 TABLET BY MOUTH EVERY DAY, Disp: 90 tablet, Rfl: 0 .  baclofen (LIORESAL) 10 MG tablet, Take 0.5 tablets (5 mg total) by mouth 3 (three) times daily., Disp: 30 tablet, Rfl: 0 .  cephALEXin (KEFLEX) 250 MG capsule, Take by mouth 4 (four) times daily., Disp: , Rfl:  .  cetirizine (ZYRTEC) 10 MG tablet, Take 1 tablet (10 mg total) by mouth daily., Disp: 90 tablet, Rfl: 0 .  EPINEPHrine (EPIPEN 2-PAK) 0.3 mg/0.3 mL IJ SOAJ injection, Inject into the muscle once. Inject 0.3mg  intramuscular route once as needed., Disp: , Rfl:  .  mometasone (ELOCON) 0.1 % cream, Apply topically 2 (two) times daily. to affected area, Disp: 45 g, Rfl: 0 .  olmesartan (BENICAR) 20 MG tablet, TAKE 1 TABLET BY MOUTH EVERY DAY, Disp: 90 tablet, Rfl: 0 .  simvastatin (ZOCOR) 20 MG tablet, TAKE 1 TABLET BY MOUTH EVERY DAY, Disp: 90 tablet, Rfl: 0 .  triamterene-hydrochlorothiazide (MAXZIDE-25) 37.5-25 MG tablet, TAKE 1/2 TABLET BY MOUTH EVERY DAY, Disp: 90 tablet, Rfl: 0  Current Facility-Administered Medications:  .  ipratropium-albuterol (DUONEB) 0.5-2.5 (3) MG/3ML nebulizer solution 3 mL, 3 mL, Nebulization, Once,  Rodriguez-Southworth, Nettie Elm, PA-C   Allergies  Allergen Reactions  . Codeine Nausea Only    unknown  . Tramadol Nausea Only     Review of Systems  Constitutional: Negative.   Respiratory: Negative.   Cardiovascular: Negative.  Negative for chest pain, palpitations and leg swelling.  Musculoskeletal: Negative.  Negative for arthralgias.  Skin: Positive for rash (rash to palmar surface of hands, arms and back).  Allergic/Immunologic: Negative for environmental allergies, food allergies and immunocompromised state.  Neurological: Negative for dizziness and headaches.  Psychiatric/Behavioral: Negative.      Today's Vitals   12/05/18 1458  BP: 132/80  Pulse: 65  Temp: 98.3 F (36.8 C)  TempSrc: Oral  Weight: 186 lb (84.4 kg)  Height: 5' 0.4" (1.534 m)  PainSc: 0-No pain   Body mass index is 35.85 kg/m.   Objective:  Physical Exam Constitutional:      General: She is not in acute distress.    Appearance: Normal appearance. She is obese.  Cardiovascular:     Rate and Rhythm: Normal rate and regular rhythm.     Pulses: Normal pulses.     Heart sounds: Normal heart sounds. No murmur.  Skin:    General: Skin is warm.     Capillary Refill: Capillary refill takes less than 2 seconds.     Findings: Rash (  rash to bilateral hands, firm vesicles present to right palmar surface.  ) present.  Neurological:     General: No focal deficit present.     Mental Status: She is alert and oriented to person, place, and time.  Psychiatric:        Mood and Affect: Mood normal.        Behavior: Behavior normal.        Thought Content: Thought content normal.        Judgment: Judgment normal.         Assessment And Plan:     1. Rash and nonspecific skin eruption  Will treat with kenalog since this is worse  I have discussed with her different disease processes that this type of rash can cause including allergic reaction, bed bugs, insect bites, psoriasis and syphyllis  I will go  ahead and refer her to dermatology as well. - triamcinolone acetonide (KENALOG-40) injection 40 mg - T pallidum Screening [place]   Minette Brine, FNP    THE PATIENT IS ENCOURAGED TO PRACTICE SOCIAL DISTANCING DUE TO THE COVID-19 PANDEMIC.

## 2018-12-07 ENCOUNTER — Other Ambulatory Visit: Payer: Self-pay

## 2018-12-07 DIAGNOSIS — Z20822 Contact with and (suspected) exposure to covid-19: Secondary | ICD-10-CM

## 2018-12-07 LAB — ALLERGENS (48)
Alternaria Alternata IgE: <0.1 kU/L
Amer Sycamore IgE Qn: <0.1 kU/L
Aureobasidi Pullulans IgE: <0.1 kU/L
Bahia Grass IgE: <0.1 kU/L
Beef IgE: <0.1 kU/L
Bermuda Grass IgE: <0.1 kU/L
Cat Dander IgE: <0.1 kU/L
Cedar, Mountain IgE: <0.1 kU/L
Cladosporium Herbarum IgE: <0.1 kU/L
Cocklebur IgE: <0.1 kU/L
Cockroach, German IgE: 0.14 kU/L — AB
Codfish IgE: <0.1 kU/L
Common Silver Birch IgE: <0.1 kU/L
Cottonwood IgE: <0.1 kU/L
D Farinae IgE: 5.1 kU/L — AB
D Pteronyssinus IgE: 5.53 kU/L — AB
Dog Dander IgE: <0.1 kU/L
Egg, Whole IgE: <0.1 kU/L
Elm, American IgE: <0.1 kU/L
F020-IgE Almond: <0.1 kU/L
Hickory, White IgE: <0.1 kU/L
Johnson Grass IgE: <0.1 kU/L
Lamb's Quarters IgE: <0.1 kU/L
M009-IgE Fusarium proliferatum: <0.1 kU/L
M207-IgE Aspergillus niger: <0.1 kU/L
Maple/Box Elder IgE: <0.1 kU/L
Milk IgE: <0.1 kU/L
Mugwort IgE Qn: <0.1 kU/L
Oak, White IgE: <0.1 kU/L
Peanut IgE: <0.1 kU/L
Penicillium Chrysogen IgE: <0.1 kU/L
Pigweed, Rough IgE: <0.1 kU/L
Pine, White IgE: <0.1 kU/L
Plantain, English IgE: <0.1 kU/L
Setomelanomma Rostrat: <0.1 kU/L
Sheep Sorrel IgE Qn: <0.1 kU/L
Shrimp IgE: 1.44 kU/L — AB
Soybean IgE: <0.1 kU/L
Stemphylium Herbarum IgE: <0.1 kU/L
Sweet gum IgE RAST Ql: <0.1 kU/L
T005-IgE Beech (American): <0.1 kU/L
T010-IgE Walnut: <0.1 kU/L
T012-IgE Willow: <0.1 kU/L
T015-IgE Ash, White: <0.1 kU/L
Timothy Grass IgE: <0.1 kU/L
W003-IgE Ragweed, Giant: <0.1 kU/L
Wheat IgE: <0.1 kU/L
White Mulberry IgE: <0.1 kU/L

## 2018-12-07 LAB — T PALLIDUM SCREENING CASCADE: T pallidum Antibodies (TP-PA): NONREACTIVE

## 2018-12-08 LAB — NOVEL CORONAVIRUS, NAA: SARS-CoV-2, NAA: NOT DETECTED

## 2018-12-15 ENCOUNTER — Ambulatory Visit: Payer: Medicare Other

## 2018-12-15 ENCOUNTER — Ambulatory Visit: Payer: Medicare Other | Admitting: Nurse Practitioner

## 2018-12-16 ENCOUNTER — Other Ambulatory Visit: Payer: Self-pay | Admitting: Nurse Practitioner

## 2018-12-16 MED ORDER — BACLOFEN 10 MG PO TABS: 5.0000 mg | ORAL_TABLET | Freq: Three times a day (TID) | ORAL | 1 refills | Status: DC | PRN

## 2018-12-19 ENCOUNTER — Other Ambulatory Visit: Payer: Self-pay

## 2018-12-19 ENCOUNTER — Ambulatory Visit (INDEPENDENT_AMBULATORY_CARE_PROVIDER_SITE_OTHER): Payer: Medicare Other | Admitting: Nurse Practitioner

## 2018-12-19 ENCOUNTER — Encounter: Payer: Self-pay | Admitting: Nurse Practitioner

## 2018-12-19 VITALS — BP 118/84 | HR 63 | Temp 98.4°F | Ht 60.8 in | Wt 179.6 lb

## 2018-12-19 DIAGNOSIS — M545 Low back pain, unspecified: Secondary | ICD-10-CM

## 2018-12-19 DIAGNOSIS — R112 Nausea with vomiting, unspecified: Secondary | ICD-10-CM

## 2018-12-19 LAB — POCT URINALYSIS DIPSTICK
Bilirubin, UA: NEGATIVE
Blood, UA: NEGATIVE
Glucose, UA: NEGATIVE
Ketones, UA: NEGATIVE
Leukocytes, UA: NEGATIVE
Nitrite, UA: NEGATIVE
Protein, UA: NEGATIVE
Spec Grav, UA: 1.02 (ref 1.010–1.025)
Urobilinogen, UA: 0.2 E.U./dL
pH, UA: 7 (ref 5.0–8.0)

## 2018-12-19 MED ORDER — KETOROLAC TROMETHAMINE 60 MG/2ML IM SOLN
60.0000 mg | Freq: Once | INTRAMUSCULAR | Status: AC
  Administered 2018-12-19: 16:00:00 60 mg via INTRAMUSCULAR

## 2018-12-19 MED ORDER — PROMETHAZINE HCL 12.5 MG PO TABS: 12.5000 mg | ORAL_TABLET | Freq: Four times a day (QID) | ORAL | 0 refills | Status: DC | PRN

## 2018-12-19 MED ORDER — PROMETHAZINE HCL 25 MG/ML IJ SOLN
25.0000 mg | Freq: Once | INTRAMUSCULAR | Status: AC
  Administered 2018-12-19: 25 mg via INTRAMUSCULAR

## 2018-12-19 MED ORDER — BACLOFEN 10 MG PO TABS: 10.0000 mg | ORAL_TABLET | Freq: Three times a day (TID) | ORAL | 1 refills | Status: DC | PRN

## 2018-12-19 NOTE — Patient Instructions (Signed)
Acute Back Pain, Adult Acute back pain is sudden and usually short-lived. It is often caused by an injury to the muscles and tissues in the back. The injury may result from:  A muscle or ligament getting overstretched or torn (strained). Ligaments are tissues that connect bones to each other. Lifting something improperly can cause a back strain.  Wear and tear (degeneration) of the spinal disks. Spinal disks are circular tissue that provides cushioning between the bones of the spine (vertebrae).  Twisting motions, such as while playing sports or doing yard work.  A hit to the back.  Arthritis. You may have a physical exam, lab tests, and imaging tests to find the cause of your pain. Acute back pain usually goes away with rest and home care. Follow these instructions at home: Managing pain, stiffness, and swelling  Take over-the-counter and prescription medicines only as told by your health care provider.  Your health care provider may recommend applying ice during the first 24-48 hours after your pain starts. To do this: ? Put ice in a plastic bag. ? Place a towel between your skin and the bag. ? Leave the ice on for 20 minutes, 2-3 times a day.  If directed, apply heat to the affected area as often as told by your health care provider. Use the heat source that your health care provider recommends, such as a moist heat pack or a heating pad. ? Place a towel between your skin and the heat source. ? Leave the heat on for 20-30 minutes. ? Remove the heat if your skin turns bright red. This is especially important if you are unable to feel pain, heat, or cold. You have a greater risk of getting burned. Activity   Do not stay in bed. Staying in bed for more than 1-2 days can delay your recovery.  Sit up and stand up straight. Avoid leaning forward when you sit, or hunching over when you stand. ? If you work at a desk, sit close to it so you do not need to lean over. Keep your chin tucked  in. Keep your neck drawn back, and keep your elbows bent at a right angle. Your arms should look like the letter "L." ? Sit high and close to the steering wheel when you drive. Add lower back (lumbar) support to your car seat, if needed.  Take short walks on even surfaces as soon as you are able. Try to increase the length of time you walk each day.  Do not sit, drive, or stand in one place for more than 30 minutes at a time. Sitting or standing for long periods of time can put stress on your back.  Do not drive or use heavy machinery while taking prescription pain medicine.  Use proper lifting techniques. When you bend and lift, use positions that put less stress on your back: ? Bend your knees. ? Keep the load close to your body. ? Avoid twisting.  Exercise regularly as told by your health care provider. Exercising helps your back heal faster and helps prevent back injuries by keeping muscles strong and flexible.  Work with a physical therapist to make a safe exercise program, as recommended by your health care provider. Do any exercises as told by your physical therapist. Lifestyle  Maintain a healthy weight. Extra weight puts stress on your back and makes it difficult to have good posture.  Avoid activities or situations that make you feel anxious or stressed. Stress and anxiety increase muscle   tension and can make back pain worse. Learn ways to manage anxiety and stress, such as through exercise. General instructions  Sleep on a firm mattress in a comfortable position. Try lying on your side with your knees slightly bent. If you lie on your back, put a pillow under your knees.  Follow your treatment plan as told by your health care provider. This may include: ? Cognitive or behavioral therapy. ? Acupuncture or massage therapy. ? Meditation or yoga. Contact a health care provider if:  You have pain that is not relieved with rest or medicine.  You have increasing pain going down  into your legs or buttocks.  Your pain does not improve after 2 weeks.  You have pain at night.  You lose weight without trying.  You have a fever or chills. Get help right away if:  You develop new bowel or bladder control problems.  You have unusual weakness or numbness in your arms or legs.  You develop nausea or vomiting.  You develop abdominal pain.  You feel faint. Summary  Acute back pain is sudden and usually short-lived.  Use proper lifting techniques. When you bend and lift, use positions that put less stress on your back.  Take over-the-counter and prescription medicines and apply heat or ice as directed by your health care provider. This information is not intended to replace advice given to you by your health care provider. Make sure you discuss any questions you have with your health care provider. Document Released: 01/26/2005 Document Revised: 05/17/2018 Document Reviewed: 09/09/2016 Elsevier Patient Education  2020 [employer] Vomiting, Adult Vomiting occurs when stomach contents are thrown up and out of the mouth. Many people notice nausea before vomiting. Vomiting can make you feel weak and cause you to become dehydrated. Dehydration can make you feel tired and thirsty, cause you to have a dry mouth, and decrease how often you urinate. Older adults and people who have other diseases or a weak body defense system (immune system) are at higher risk for dehydration. It is important to treat vomiting as told by your health care provider. Follow these instructions at home:  Eating and drinking     Follow these recommendations as told by your health care provider:  Take an oral rehydration solution (ORS). This is a drink that is sold at pharmacies and retail stores.  Eat bland, easy-to-digest foods in small amounts as you are able. These foods include bananas, applesauce, rice, lean meats, toast, and crackers.  Drink clear fluids slowly and in small amounts  as you are able. Clear fluids include water, ice chips, low-calorie sports drinks, and fruit juice that has water added (diluted fruit juice).  Avoid drinking fluids that contain a lot of sugar or caffeine, such as energy drinks, sports drinks, and soda.  Avoid alcohol.  Avoid spicy or fatty foods.  General instructions  Wash your hands often using soap and water. If soap and water are not available, use hand sanitizer. Make sure that everyone in your household washes their hands frequently.  Take over-the-counter and prescription medicines only as told by your health care provider.  Rest at home while you recover.  Watch your condition for any changes.  Keep all follow-up visits as told by your health care provider. This is important. Contact a health care provider if:  Your vomiting gets worse.  You have new symptoms.  You have a fever.  You cannot drink fluids without vomiting.  You feel light-headed or dizzy.  You have a headache.  You have muscle cramps.  You have a rash.  You have pain while urinating. Get help right away if:  You have pain in your chest, neck, arm, or jaw.  You feel extremely weak or you faint.  You have persistent vomiting.  You have vomit that is bright red or looks like black coffee grounds.  You have stools that are bloody or black, or stools that look like tar.  You have a severe headache, a stiff neck, or both.  You have severe pain, cramping, or bloating in your abdomen.  You have trouble breathing or you are breathing very quickly.  Your heart is beating very quickly.  Your skin feels cold and clammy.  You feel confused.  You have signs of dehydration, such as: ? Dark urine, very little urine, or no urine. ? Cracked lips. ? Dry mouth. ? Sunken eyes. ? Sleepiness. ? Weakness. These symptoms may represent a serious problem that is an emergency. Do not wait to see if the symptoms will go away. Get medical help right  away. Call your local emergency services (911 in the [place] Do not drive yourself to the hospital. Summary  Vomiting occurs when stomach contents are thrown up and out of the mouth. Vomiting can cause you to become dehydrated. Older adults and people who have other diseases or a weak immune system are at higher risk for dehydration.  It is important to treat vomiting as told by your health care provider. Follow your health care provider's instructions about eating and drinking.  Wash your hands often using soap and water. If soap and water are not available, use hand sanitizer. Make sure that everyone in your household washes their hands frequently.  Watch your condition for any changes and for signs of dehydration.  Keep all follow-up visits as told by your health care provider. This is important. This information is not intended to replace advice given to you by your health care provider. Make sure you discuss any questions you have with your health care provider. Document Released: 02/22/2015 Document Revised: 07/06/2017 Document Reviewed: 07/06/2017 Elsevier Patient Education  2020 [employer].

## 2018-12-19 NOTE — Progress Notes (Signed)
Subjective:     Patient ID: Ashley Johns , female    DOB: [birth year] , 83 y.o.   MRN: [id number]   Chief Complaint  Patient presents with  . Back Pain    patient stated she has been having back pain for 2 days  . rash f/u    HPI  She has been taking baclofen for couple days.  Urine has been clear. She is taking   Back Pain This is a new problem. Episode onset: 2 days ago. Quality: nagging pain. The pain does not radiate. The pain is at a severity of 9/10. The pain is severe. The pain is the same all the time. The symptoms are aggravated by sitting. Pertinent negatives include no abdominal pain, chest pain or headaches. Risk factors include sedentary lifestyle. She has tried muscle relaxant for the symptoms. The treatment provided moderate relief.     Past Medical History:  Diagnosis Date  . Hypertension      Family History  Problem Relation Age of Onset  . Tuberculosis Mother   . Hypertension Father      Current Outpatient Medications:  .  albuterol (PROVENTIL HFA;VENTOLIN HFA) 108 (90 Base) MCG/ACT inhaler, Inhale 2 puffs into the lungs every 4 (four) hours as needed for wheezing or shortness of breath. Please educate how to use, Disp: 1 Inhaler, Rfl: 0 .  atenolol (TENORMIN) 50 MG tablet, TAKE 1 TABLET BY MOUTH EVERY DAY, Disp: 90 tablet, Rfl: 0 .  baclofen (LIORESAL) 10 MG tablet, Take 0.5 tablets (5 mg total) by mouth 3 (three) times daily as needed for muscle spasms., Disp: 30 tablet, Rfl: 1 .  cetirizine (ZYRTEC) 10 MG tablet, Take 1 tablet (10 mg total) by mouth daily., Disp: 90 tablet, Rfl: 0 .  EPINEPHrine (EPIPEN 2-PAK) 0.3 mg/0.3 mL IJ SOAJ injection, Inject into the muscle once. Inject 0.3mg  intramuscular route once as needed., Disp: , Rfl:  .  mometasone (ELOCON) 0.1 % cream, Apply topically 2 (two) times daily. to affected area, Disp: 45 g, Rfl: 0 .  olmesartan (BENICAR) 20 MG tablet, TAKE 1 TABLET BY MOUTH EVERY DAY, Disp: 90 tablet, Rfl: 0 .  simvastatin  (ZOCOR) 20 MG tablet, TAKE 1 TABLET BY MOUTH EVERY DAY, Disp: 90 tablet, Rfl: 0 .  triamterene-hydrochlorothiazide (MAXZIDE-25) 37.5-25 MG tablet, TAKE 1/2 TABLET BY MOUTH EVERY DAY, Disp: 90 tablet, Rfl: 0 .  cephALEXin (KEFLEX) 250 MG capsule, Take by mouth 4 (four) times daily., Disp: , Rfl:   Current Facility-Administered Medications:  .  ipratropium-albuterol (DUONEB) 0.5-2.5 (3) MG/3ML nebulizer solution 3 mL, 3 mL, Nebulization, Once, Rodriguez-Southworth, Sunday Spillers, PA-C   Allergies  Allergen Reactions  . Codeine Nausea Only    unknown  . Tramadol Nausea Only     Review of Systems  Constitutional: Negative.   Respiratory: Negative.   Cardiovascular: Negative for chest pain, palpitations and leg swelling.  Gastrointestinal: Negative for abdominal pain.  Musculoskeletal: Positive for back pain (Friday her pain improved but got worse yesterday).       Slight fall after she had the pain to her left low back   Neurological: Negative for dizziness and headaches.     Today's Vitals   12/19/18 1456  BP: 118/84  Pulse: 63  Temp: 98.4 F (36.9 C)  TempSrc: Oral  Weight: 179 lb 9.6 oz (81.5 kg)  Height: 5' 0.8" (1.544 m)  PainSc: 8   PainLoc: Back   Body mass index is 34.16 kg/m.   Objective:  Physical  Exam Constitutional:      Appearance: Normal appearance.  Cardiovascular:     Rate and Rhythm: Normal rate and regular rhythm.     Pulses: Normal pulses.     Heart sounds: Normal heart sounds. No murmur.  Pulmonary:     Effort: Pulmonary effort is normal.     Breath sounds: Normal breath sounds.  Neurological:     General: No focal deficit present.     Mental Status: She is alert and oriented to person, place, and time.  Psychiatric:        Mood and Affect: Mood normal.        Behavior: Behavior normal.        Thought Content: Thought content normal.        Judgment: Judgment normal.         Assessment And Plan:     1. Acute left-sided low back pain without  sciatica  Urinalysis is normal  If not better tomorrow will order a kidney ultrasoud to check for kidney stones.    She has nausea when taking aspirin - she is given toradol in office and phenergan.  - baclofen (LIORESAL) 10 MG tablet; Take 1 tablet (10 mg total) by mouth 3 (three) times daily as needed for muscle spasms.  Dispense: 30 tablet; Refill: 1 - ketorolac (TORADOL) injection 60 mg  2. Non-intractable vomiting with nausea, unspecified vomiting type  She had vomiting while in the office  Phenergan 12.5mg  IM given in office - promethazine (PHENERGAN) injection 25 mg - promethazine (PHENERGAN) 12.5 MG tablet; Take 1 tablet (12.5 mg total) by mouth every 6 (six) hours as needed for nausea or vomiting.  Dispense: 20 tablet; Refill: 0    Arnette Felts, FNP    THE PATIENT IS ENCOURAGED TO PRACTICE SOCIAL DISTANCING DUE TO THE COVID-19 PANDEMIC.

## 2018-12-20 ENCOUNTER — Ambulatory Visit: Payer: Medicare Other | Admitting: Nurse Practitioner

## 2018-12-21 ENCOUNTER — Telehealth: Payer: Self-pay

## 2018-12-21 NOTE — Telephone Encounter (Signed)
I called patient to see how she is doing since her visit. I left her a v/m to call the office.  SHE RETURNED Ashley Johns AND SHE JUST WANTED ME TO [place] YOU. [name]

## 2018-12-27 ENCOUNTER — Telehealth: Payer: Self-pay

## 2018-12-27 NOTE — Telephone Encounter (Signed)
I called patient to notify her that [facility] has been trying to contact her regarding an appointment. Their number is [phone]. I left pt v/m to call the office [facility]

## 2018-12-29 ENCOUNTER — Other Ambulatory Visit: Payer: Self-pay

## 2018-12-29 MED ORDER — HYDROXYZINE PAMOATE 25 MG PO CAPS: 25.0000 mg | ORAL_CAPSULE | Freq: Two times a day (BID) | ORAL | 0 refills | Status: DC | PRN

## 2019-01-11 ENCOUNTER — Telehealth: Payer: Self-pay

## 2019-01-11 NOTE — Telephone Encounter (Signed)
Returned call to pt left vm

## 2019-01-12 ENCOUNTER — Telehealth: Payer: Self-pay

## 2019-01-12 NOTE — Telephone Encounter (Signed)
Patient called stating if her kidneys were fine why does she still have all those bumps on her and requested the number for the dermatologist. I returned her call and was unable to leave a v/m YRL,RMA

## 2019-01-13 ENCOUNTER — Other Ambulatory Visit: Payer: Self-pay | Admitting: Nurse Practitioner

## 2019-01-20 ENCOUNTER — Other Ambulatory Visit: Payer: Self-pay | Admitting: Nurse Practitioner

## 2019-01-23 ENCOUNTER — Other Ambulatory Visit: Payer: Self-pay | Admitting: Nurse Practitioner

## 2019-01-23 DIAGNOSIS — I10 Essential (primary) hypertension: Secondary | ICD-10-CM

## 2019-01-23 MED ORDER — ATENOLOL 50 MG PO TABS: 50.0000 mg | ORAL_TABLET | Freq: Every day | ORAL | 1 refills | Status: DC

## 2019-02-15 ENCOUNTER — Ambulatory Visit: Payer: Self-pay | Admitting: Nurse Practitioner

## 2019-02-18 ENCOUNTER — Ambulatory Visit: Payer: Medicare Other | Attending: Internal Medicine

## 2019-02-18 DIAGNOSIS — Z23 Encounter for immunization: Secondary | ICD-10-CM | POA: Insufficient documentation

## 2019-02-18 NOTE — Progress Notes (Signed)
   Covid-19 Vaccination Clinic  Name:  Ashley Johns    MRN: [id number] DOB: 05-Apr-1935  02/18/2019  [name] was observed post Covid-19 immunization for 15 minutes without incidence. She was provided with Vaccine Information Sheet and instruction to access the V-Safe system.   [name] was instructed to call 911 with any severe reactions post vaccine: Marland Kitchen Difficulty breathing  . Swelling of your face and throat  . A fast heartbeat  . A bad rash all over your body  . Dizziness and weakness    Immunizations Administered    Name Date Dose VIS Date Route   Pfizer COVID-19 Vaccine 02/18/2019  1:51 PM 0.3 mL 01/20/2019 Intramuscular   Manufacturer: [employer], [employer]   Lot: [id number]   NDC: [address]-5

## 2019-02-20 DIAGNOSIS — L300 Nummular dermatitis: Secondary | ICD-10-CM | POA: Diagnosis not present

## 2019-02-20 DIAGNOSIS — L2089 Other atopic dermatitis: Secondary | ICD-10-CM | POA: Diagnosis not present

## 2019-02-22 ENCOUNTER — Other Ambulatory Visit: Payer: Self-pay

## 2019-02-22 DIAGNOSIS — M545 Low back pain, unspecified: Secondary | ICD-10-CM

## 2019-02-22 DIAGNOSIS — I10 Essential (primary) hypertension: Secondary | ICD-10-CM

## 2019-02-22 MED ORDER — ATENOLOL 50 MG PO TABS: 50.0000 mg | ORAL_TABLET | Freq: Every day | ORAL | 1 refills | Status: DC

## 2019-02-22 MED ORDER — BACLOFEN 10 MG PO TABS: 10.0000 mg | ORAL_TABLET | Freq: Three times a day (TID) | ORAL | 1 refills | Status: AC | PRN

## 2019-02-22 MED ORDER — CETIRIZINE HCL 10 MG PO TABS: 10.0000 mg | ORAL_TABLET | Freq: Every day | ORAL | 0 refills | Status: DC

## 2019-03-02 ENCOUNTER — Ambulatory Visit (INDEPENDENT_AMBULATORY_CARE_PROVIDER_SITE_OTHER): Payer: Medicare Other | Admitting: Nurse Practitioner

## 2019-03-02 ENCOUNTER — Other Ambulatory Visit: Payer: Self-pay

## 2019-03-02 ENCOUNTER — Encounter: Payer: Self-pay | Admitting: Nurse Practitioner

## 2019-03-02 VITALS — BP 110/68 | HR 72 | Temp 97.9°F | Ht 61.8 in | Wt 180.8 lb

## 2019-03-02 DIAGNOSIS — R300 Dysuria: Secondary | ICD-10-CM

## 2019-03-02 DIAGNOSIS — R3129 Other microscopic hematuria: Secondary | ICD-10-CM | POA: Diagnosis not present

## 2019-03-02 DIAGNOSIS — W57XXXA Bitten or stung by nonvenomous insect and other nonvenomous arthropods, initial encounter: Secondary | ICD-10-CM

## 2019-03-02 LAB — POCT URINALYSIS DIPSTICK
Bilirubin, UA: NEGATIVE
Glucose, UA: NEGATIVE
Ketones, UA: NEGATIVE
Nitrite, UA: NEGATIVE
Protein, UA: POSITIVE — AB
Spec Grav, UA: 1.02 (ref 1.010–1.025)
Urobilinogen, UA: 0.2 E.U./dL
pH, UA: 6 (ref 5.0–8.0)

## 2019-03-02 MED ORDER — NITROFURANTOIN MONOHYD MACRO 100 MG PO CAPS: 100.0000 mg | ORAL_CAPSULE | Freq: Two times a day (BID) | ORAL | 0 refills | Status: AC

## 2019-03-02 MED ORDER — MOMETASONE FUROATE 0.1 % EX CREA: TOPICAL_CREAM | Freq: Two times a day (BID) | CUTANEOUS | 0 refills | Status: DC

## 2019-03-02 NOTE — Patient Instructions (Signed)
Urinary Tract Infection, Adult A urinary tract infection (UTI) is an infection of any part of the urinary tract. The urinary tract includes:  The kidneys.  The ureters.  The bladder.  The urethra. These organs make, store, and get rid of pee (urine) in the body. What are the causes? This is caused by germs (bacteria) in your genital area. These germs grow and cause swelling (inflammation) of your urinary tract. What increases the risk? You are more likely to develop this condition if:  You have a small, thin tube (catheter) to drain pee.  You cannot control when you pee or poop (incontinence).  You are female, and: ? You use these methods to prevent pregnancy:  A medicine that kills sperm (spermicide).  A device that blocks sperm (diaphragm). ? You have low levels of a female hormone (estrogen). ? You are pregnant.  You have genes that add to your risk.  You are sexually active.  You take antibiotic medicines.  You have trouble peeing because of: ? A prostate that is bigger than normal, if you are female. ? A blockage in the part of your body that drains pee from the bladder (urethra). ? A kidney stone. ? A nerve condition that affects your bladder (neurogenic bladder). ? Not getting enough to drink. ? Not peeing often enough.  You have other conditions, such as: ? Diabetes. ? A weak disease-fighting system (immune system). ? Sickle cell disease. ? Gout. ? Injury of the spine. What are the signs or symptoms? Symptoms of this condition include:  Needing to pee right away (urgently).  Peeing often.  Peeing small amounts often.  Pain or burning when peeing.  Blood in the pee.  Pee that smells bad or not like normal.  Trouble peeing.  Pee that is cloudy.  Fluid coming from the vagina, if you are female.  Pain in the belly or lower back. Other symptoms include:  Throwing up (vomiting).  No urge to eat.  Feeling mixed up (confused).  Being tired  and grouchy (irritable).  A fever.  Watery poop (diarrhea). How is this treated? This condition may be treated with:  Antibiotic medicine.  Other medicines.  Drinking enough water. Follow these instructions at home:  Medicines  Take over-the-counter and prescription medicines only as told by your doctor.  If you were prescribed an antibiotic medicine, take it as told by your doctor. Do not stop taking it even if you start to feel better. General instructions  Make sure you: ? Pee until your bladder is empty. ? Do not hold pee for a long time. ? Empty your bladder after sex. ? Wipe from front to back after pooping if you are a female. Use each tissue one time when you wipe.  Drink enough fluid to keep your pee pale yellow.  Keep all follow-up visits as told by your doctor. This is important. Contact a doctor if:  You do not get better after 1-2 days.  Your symptoms go away and then come back. Get help right away if:  You have very bad back pain.  You have very bad pain in your lower belly.  You have a fever.  You are sick to your stomach (nauseous).  You are throwing up. Summary  A urinary tract infection (UTI) is an infection of any part of the urinary tract.  This condition is caused by germs in your genital area.  There are many risk factors for a UTI. These include having a small, thin   tube to drain pee and not being able to control when you pee or poop.  Treatment includes antibiotic medicines for germs.  Drink enough fluid to keep your pee pale yellow. This information is not intended to replace advice given to you by your health care provider. Make sure you discuss any questions you have with your health care provider. Document Revised: 01/13/2018 Document Reviewed: 08/05/2017 Elsevier Patient Education  2020 [employer]  

## 2019-03-02 NOTE — Progress Notes (Signed)
This visit occurred during the SARS-CoV-2 public health emergency.  Safety protocols were in place, including screening questions prior to the visit, additional usage of staff PPE, and extensive cleaning of exam room while observing appropriate contact time as indicated for disinfecting solutions.  Subjective:     Patient ID: Ashley Johns , female    DOB: 10-15-1935 , 84 y.o.   MRN: [id number]   Chief Complaint  Patient presents with  . Dysuria    patient stated her private part has been making noises and brown seeds have been coming out    HPI  Dysuria  This is a new problem. The current episode started in the past 7 days (3 days). The problem occurs every urination. There has been no fever. She is not sexually active. There is no history of pyelonephritis. Pertinent negatives include no chills, discharge, flank pain, frequency, hematuria or nausea. Associated symptoms comments: "she reports her vaginal area is making a noise", looks like seeds in her urine brown in color. There is no history of recurrent UTIs.     Past Medical History:  Diagnosis Date  . Hypertension      Family History  Problem Relation Age of Onset  . Tuberculosis Mother   . Hypertension Father      Current Outpatient Medications:  .  albuterol (PROVENTIL HFA;VENTOLIN HFA) 108 (90 Base) MCG/ACT inhaler, Inhale 2 puffs into the lungs every 4 (four) hours as needed for wheezing or shortness of breath. Please educate how to use, Disp: 1 Inhaler, Rfl: 0 .  atenolol (TENORMIN) 50 MG tablet, Take 1 tablet (50 mg total) by mouth daily., Disp: 90 tablet, Rfl: 1 .  baclofen (LIORESAL) 10 MG tablet, Take 1 tablet (10 mg total) by mouth 3 (three) times daily as needed for muscle spasms., Disp: 30 tablet, Rfl: 1 .  cephALEXin (KEFLEX) 250 MG capsule, Take by mouth 4 (four) times daily., Disp: , Rfl:  .  cetirizine (ZYRTEC) 10 MG tablet, Take 1 tablet (10 mg total) by mouth daily., Disp: 90 tablet, Rfl: 0 .   EPINEPHrine (EPIPEN 2-PAK) 0.3 mg/0.3 mL IJ SOAJ injection, Inject into the muscle once. Inject 0.3mg  intramuscular route once as needed., Disp: , Rfl:  .  hydrOXYzine (VISTARIL) 25 MG capsule, Take 1 capsule (25 mg total) by mouth 2 (two) times daily as needed for itching., Disp: 30 capsule, Rfl: 0 .  olmesartan (BENICAR) 20 MG tablet, TAKE 1 TABLET BY MOUTH EVERY DAY, Disp: 90 tablet, Rfl: 0 .  promethazine (PHENERGAN) 12.5 MG tablet, Take 1 tablet (12.5 mg total) by mouth every 6 (six) hours as needed for nausea or vomiting., Disp: 20 tablet, Rfl: 0 .  simvastatin (ZOCOR) 20 MG tablet, TAKE 1 TABLET BY MOUTH EVERY DAY, Disp: 90 tablet, Rfl: 0 .  triamterene-hydrochlorothiazide (MAXZIDE-25) 37.5-25 MG tablet, TAKE 1/2 TABLET BY MOUTH EVERY DAY, Disp: 90 tablet, Rfl: 0 .  mometasone (ELOCON) 0.1 % cream, Apply topically 2 (two) times daily. to affected area, Disp: 45 g, Rfl: 0  Current Facility-Administered Medications:  .  ipratropium-albuterol (DUONEB) 0.5-2.5 (3) MG/3ML nebulizer solution 3 mL, 3 mL, Nebulization, Once, Rodriguez-Southworth, Sunday Spillers, PA-C   Allergies  Allergen Reactions  . Codeine Nausea Only    unknown  . Tramadol Nausea Only     Review of Systems  Constitutional: Negative for chills.  Gastrointestinal: Negative for nausea.  Genitourinary: Positive for dysuria. Negative for flank pain, frequency and hematuria.     Today's Vitals   03/02/19 1054  BP: 110/68  Pulse: 72  Temp: 97.9 F (36.6 C)  TempSrc: Oral  Weight: 180 lb 12.8 oz (82 kg)  Height: 5' 1.8" (1.57 m)  PainSc: 0-No pain   Body mass index is 33.28 kg/m.   Objective:  Physical Exam Constitutional:      General: She is not in acute distress.    Appearance: Normal appearance.  Cardiovascular:     Rate and Rhythm: Normal rate and regular rhythm.     Pulses: Normal pulses.     Heart sounds: Normal heart sounds. No murmur.  Pulmonary:     Effort: Pulmonary effort is normal. No respiratory  distress.     Breath sounds: Normal breath sounds.  Abdominal:     General: Bowel sounds are normal. There is distension (slight with rounding).     Tenderness: There is no abdominal tenderness.  Skin:    Capillary Refill: Capillary refill takes less than 2 seconds.  Neurological:     General: No focal deficit present.     Mental Status: She is alert and oriented to person, place, and time.  Psychiatric:        Mood and Affect: Mood normal.        Behavior: Behavior normal.        Thought Content: Thought content normal.        Judgment: Judgment normal.         Assessment And Plan:      1. Dysuria  Moderate blood in her urine and large leukocytes  I am sending a urine culture  Will treat with nitrofuratoin twice a day  Increase your intake of water  - POCT Urinalysis Dipstick (81002)  2. Other microscopic hematuria  Moderate blood - Culture, Urine - nitrofurantoin, macrocrystal-monohydrate, (MACROBID) 100 MG capsule; Take 1 capsule (100 mg total) by mouth 2 (two) times daily for 5 days.  Dispense: 10 capsule; Refill: 0   Arnette Felts, FNP    THE PATIENT IS ENCOURAGED TO PRACTICE SOCIAL DISTANCING DUE TO THE COVID-19 PANDEMIC.

## 2019-03-04 LAB — URINE CULTURE

## 2019-03-11 ENCOUNTER — Ambulatory Visit: Payer: Medicare Other

## 2019-03-11 ENCOUNTER — Ambulatory Visit: Payer: Medicare Other | Attending: Internal Medicine

## 2019-03-11 DIAGNOSIS — Z23 Encounter for immunization: Secondary | ICD-10-CM

## 2019-03-11 NOTE — Progress Notes (Signed)
   Covid-19 Vaccination Clinic  Name:  Ashley Johns    MRN: [id number] DOB: Apr 28, 1935  03/11/2019  Ashley Johns was observed post Covid-19 immunization for 15 minutes without incidence. She was provided with Vaccine Information Sheet and instruction to access the V-Safe system.   Ashley Johns was instructed to call 911 with any severe reactions post vaccine: Marland Kitchen Difficulty breathing  . Swelling of your face and throat  . A fast heartbeat  . A bad rash all over your body  . Dizziness and weakness    Immunizations Administered    Name Date Dose VIS Date Route   Pfizer COVID-19 Vaccine 03/11/2019 10:55 AM 0.3 mL 01/20/2019 Intramuscular   Manufacturer: [employer], [employer]   Lot: [id number]   NDC: [address]-8

## 2019-04-13 ENCOUNTER — Other Ambulatory Visit: Payer: Self-pay | Admitting: Nurse Practitioner

## 2019-04-28 ENCOUNTER — Other Ambulatory Visit: Payer: Self-pay | Admitting: Nurse Practitioner

## 2019-04-28 DIAGNOSIS — W57XXXA Bitten or stung by nonvenomous insect and other nonvenomous arthropods, initial encounter: Secondary | ICD-10-CM

## 2019-05-03 ENCOUNTER — Other Ambulatory Visit: Payer: Self-pay

## 2019-05-03 DIAGNOSIS — W57XXXA Bitten or stung by nonvenomous insect and other nonvenomous arthropods, initial encounter: Secondary | ICD-10-CM

## 2019-05-03 DIAGNOSIS — L301 Dyshidrosis [pompholyx]: Secondary | ICD-10-CM | POA: Diagnosis not present

## 2019-05-03 MED ORDER — MOMETASONE FUROATE 0.1 % EX CREA: TOPICAL_CREAM | CUTANEOUS | 0 refills | Status: DC

## 2019-05-10 ENCOUNTER — Other Ambulatory Visit: Payer: Self-pay | Admitting: Nurse Practitioner

## 2019-05-10 ENCOUNTER — Telehealth: Payer: Self-pay

## 2019-05-10 DIAGNOSIS — R300 Dysuria: Secondary | ICD-10-CM

## 2019-05-10 NOTE — Telephone Encounter (Signed)
Called patient to advise of referral to OBGYN and that they will be calling to schedule with 7-10days

## 2019-06-06 ENCOUNTER — Other Ambulatory Visit: Payer: Self-pay | Admitting: Nurse Practitioner

## 2019-06-20 ENCOUNTER — Ambulatory Visit (INDEPENDENT_AMBULATORY_CARE_PROVIDER_SITE_OTHER): Payer: Medicare Other | Admitting: Nurse Practitioner

## 2019-06-20 ENCOUNTER — Encounter: Payer: Self-pay | Admitting: Nurse Practitioner

## 2019-06-20 ENCOUNTER — Other Ambulatory Visit: Payer: Self-pay

## 2019-06-20 VITALS — BP 134/70 | HR 94 | Temp 97.6°F | Ht 61.8 in | Wt 189.0 lb

## 2019-06-20 DIAGNOSIS — R300 Dysuria: Secondary | ICD-10-CM | POA: Diagnosis not present

## 2019-06-20 LAB — POCT URINALYSIS DIPSTICK
Bilirubin, UA: NEGATIVE
Glucose, UA: NEGATIVE
Ketones, UA: NEGATIVE
Nitrite, UA: NEGATIVE
Protein, UA: POSITIVE — AB
Spec Grav, UA: >=1.03 — AB (ref 1.010–1.025)
Urobilinogen, UA: 0.2 E.U./dL
pH, UA: 5.5 (ref 5.0–8.0)

## 2019-06-20 NOTE — Progress Notes (Signed)
This visit occurred during the SARS-CoV-2 public health emergency.  Safety protocols were in place, including screening questions prior to the visit, additional usage of staff PPE, and extensive cleaning of exam room while observing appropriate contact time as indicated for disinfecting solutions.  Subjective:     Patient ID: Ashley Johns , female    DOB: 04-Nov-1935 , 84 y.o.   MRN: [id number]   Chief Complaint  Patient presents with  . Urine Output    HPI  She is having a "sound" coming out of her vagina.  She also says when she urinates she has "seeds" to come out.     Past Medical History:  Diagnosis Date  . Hypertension      Family History  Problem Relation Age of Onset  . Tuberculosis Mother   . Hypertension Father      Current Outpatient Medications:  .  albuterol (PROVENTIL HFA;VENTOLIN HFA) 108 (90 Base) MCG/ACT inhaler, Inhale 2 puffs into the lungs every 4 (four) hours as needed for wheezing or shortness of breath. Please educate how to use, Disp: 1 Inhaler, Rfl: 0 .  atenolol (TENORMIN) 50 MG tablet, Take 1 tablet (50 mg total) by mouth daily., Disp: 90 tablet, Rfl: 1 .  baclofen (LIORESAL) 10 MG tablet, Take 1 tablet (10 mg total) by mouth 3 (three) times daily as needed for muscle spasms., Disp: 30 tablet, Rfl: 1 .  cephALEXin (KEFLEX) 250 MG capsule, Take by mouth 4 (four) times daily., Disp: , Rfl:  .  cetirizine (ZYRTEC) 10 MG tablet, TAKE 1 TABLET BY MOUTH DAILY, Disp: 90 tablet, Rfl: 0 .  EPINEPHrine (EPIPEN 2-PAK) 0.3 mg/0.3 mL IJ SOAJ injection, Inject into the muscle once. Inject 0.3mg  intramuscular route once as needed., Disp: , Rfl:  .  hydrOXYzine (VISTARIL) 25 MG capsule, Take 1 capsule (25 mg total) by mouth 2 (two) times daily as needed for itching., Disp: 30 capsule, Rfl: 0 .  mometasone (ELOCON) 0.1 % cream, APPLY EXTERNALLY TO THE AFFECTED AREA TWICE DAILY, Disp: 45 g, Rfl: 0 .  olmesartan (BENICAR) 20 MG tablet, TAKE 1 TABLET BY MOUTH EVERY DAY,  Disp: 90 tablet, Rfl: 0 .  promethazine (PHENERGAN) 12.5 MG tablet, Take 1 tablet (12.5 mg total) by mouth every 6 (six) hours as needed for nausea or vomiting., Disp: 20 tablet, Rfl: 0 .  simvastatin (ZOCOR) 20 MG tablet, TAKE 1 TABLET BY MOUTH EVERY DAY, Disp: 90 tablet, Rfl: 0 .  triamterene-hydrochlorothiazide (MAXZIDE-25) 37.5-25 MG tablet, TAKE 1/2 TABLET BY MOUTH EVERY DAY, Disp: 90 tablet, Rfl: 0  Current Facility-Administered Medications:  .  ipratropium-albuterol (DUONEB) 0.5-2.5 (3) MG/3ML nebulizer solution 3 mL, 3 mL, Nebulization, Once, Rodriguez-Southworth, Nettie Elm, PA-C   Allergies  Allergen Reactions  . Codeine Nausea Only    unknown  . Tramadol Nausea Only     Review of Systems  Constitutional: Negative.   Respiratory: Negative.   Cardiovascular: Negative.  Negative for chest pain, palpitations and leg swelling.  Neurological: Negative for dizziness and headaches.  Psychiatric/Behavioral: Negative.      Today's Vitals   06/20/19 1040  BP: 134/70  Pulse: 94  Temp: 97.6 F (36.4 C)  TempSrc: Oral  SpO2: 98%  Weight: 189 lb (85.7 kg)  Height: 5' 1.8" (1.57 m)   Body mass index is 34.79 kg/m.   Objective:  Physical Exam Constitutional:      General: She is not in acute distress.    Appearance: Normal appearance. She is obese.  Cardiovascular:  Rate and Rhythm: Normal rate and regular rhythm.     Pulses: Normal pulses.     Heart sounds: Normal heart sounds. No murmur.  Pulmonary:     Effort: Pulmonary effort is normal. No respiratory distress.     Breath sounds: Normal breath sounds.  Skin:    Capillary Refill: Capillary refill takes less than 2 seconds.  Neurological:     General: No focal deficit present.     Mental Status: She is alert and oriented to person, place, and time.     Cranial Nerves: No cranial nerve deficit.  Psychiatric:        Mood and Affect: Mood normal.        Behavior: Behavior normal.        Thought Content: Thought  content normal.        Judgment: Judgment normal.         Assessment And Plan:     1. Dysuria  Urinalysis is negative for UTI, positive for protein  She continues to hear a sound from her vaginal area when she urinates  Referral specialist called to Dr. Garwin Brothers office and they will call her for an appt.  - POCT Urinalysis Dipstick (81002)   Minette Brine, FNP    THE PATIENT IS ENCOURAGED TO PRACTICE SOCIAL DISTANCING DUE TO THE COVID-19 PANDEMIC.

## 2019-06-27 ENCOUNTER — Telehealth: Payer: Self-pay

## 2019-06-27 NOTE — Telephone Encounter (Signed)
Patient called stating she still has not heard back regarding an appointment to the obgyn. I called the office and they stated the provider is reviewing the chart and then they will call the pt with an appointment date and time. Pt has been notified YL,RMA

## 2019-07-24 ENCOUNTER — Other Ambulatory Visit: Payer: Self-pay | Admitting: Nurse Practitioner

## 2019-07-27 ENCOUNTER — Telehealth: Payer: Self-pay

## 2019-07-27 DIAGNOSIS — L301 Dyshidrosis [pompholyx]: Secondary | ICD-10-CM | POA: Diagnosis not present

## 2019-07-27 NOTE — Telephone Encounter (Signed)
Patient consented to virtual appointment. YL,RMA 

## 2019-08-01 ENCOUNTER — Encounter: Payer: Self-pay | Admitting: Nurse Practitioner

## 2019-08-01 ENCOUNTER — Telehealth: Payer: Self-pay | Admitting: Nurse Practitioner

## 2019-08-01 ENCOUNTER — Other Ambulatory Visit: Payer: Self-pay

## 2019-08-01 ENCOUNTER — Ambulatory Visit (INDEPENDENT_AMBULATORY_CARE_PROVIDER_SITE_OTHER): Payer: Medicare Other | Admitting: Nurse Practitioner

## 2019-08-01 VITALS — BP 124/80 | HR 60 | Temp 98.3°F | Ht 61.8 in | Wt 189.4 lb

## 2019-08-01 DIAGNOSIS — J301 Allergic rhinitis due to pollen: Secondary | ICD-10-CM | POA: Diagnosis not present

## 2019-08-01 DIAGNOSIS — R059 Cough, unspecified: Secondary | ICD-10-CM

## 2019-08-01 DIAGNOSIS — R05 Cough: Secondary | ICD-10-CM | POA: Diagnosis not present

## 2019-08-01 MED ORDER — ALBUTEROL SULFATE HFA 108 (90 BASE) MCG/ACT IN AERS: 2.0000 | INHALATION_SPRAY | RESPIRATORY_TRACT | 1 refills | Status: DC | PRN

## 2019-08-01 MED ORDER — CETIRIZINE HCL 10 MG PO TABS: 10.0000 mg | ORAL_TABLET | Freq: Every day | ORAL | 1 refills | Status: DC

## 2019-08-01 NOTE — Progress Notes (Signed)
This visit occurred during the SARS-CoV-2 public health emergency.  Safety protocols were in place, including screening questions prior to the visit, additional usage of staff PPE, and extensive cleaning of exam room while observing appropriate contact time as indicated for disinfecting solutions.  Subjective:     Patient ID: Ashley Johns , female    DOB: 05-21-1935 , 84 y.o.   MRN: [id number]   Chief Complaint  Patient presents with  . Cough    patient stated she has not been having much of a cough since she started her bp med and has been taking mucinex     HPI  She started taking mucinex and HBP coricidan.  Her foster child had a cold who was negative for covid.  She has had her covid vaccine.    Cough This is a new problem. The current episode started in the past 7 days. The problem has been gradually improving. The problem occurs constantly. The cough is productive of sputum (light green sputum initially). Pertinent negatives include no chest pain, chills, fever, headaches, nasal congestion or shortness of breath. Nothing aggravates the symptoms. She has tried OTC cough suppressant and prescription cough suppressant for the symptoms. There is no history of asthma.     Past Medical History:  Diagnosis Date  . Hypertension      Family History  Problem Relation Age of Onset  . Tuberculosis Mother   . Hypertension Father      Current Outpatient Medications:  .  albuterol (PROVENTIL HFA;VENTOLIN HFA) 108 (90 Base) MCG/ACT inhaler, Inhale 2 puffs into the lungs every 4 (four) hours as needed for wheezing or shortness of breath. Please educate how to use, Disp: 1 Inhaler, Rfl: 0 .  atenolol (TENORMIN) 50 MG tablet, Take 1 tablet (50 mg total) by mouth daily., Disp: 90 tablet, Rfl: 1 .  baclofen (LIORESAL) 10 MG tablet, Take 1 tablet (10 mg total) by mouth 3 (three) times daily as needed for muscle spasms., Disp: 30 tablet, Rfl: 1 .  cetirizine (ZYRTEC) 10 MG tablet, TAKE 1  TABLET BY MOUTH DAILY, Disp: 90 tablet, Rfl: 0 .  EPINEPHrine (EPIPEN 2-PAK) 0.3 mg/0.3 mL IJ SOAJ injection, Inject into the muscle once. Inject 0.3mg  intramuscular route once as needed., Disp: , Rfl:  .  hydrOXYzine (VISTARIL) 25 MG capsule, Take 1 capsule (25 mg total) by mouth 2 (two) times daily as needed for itching., Disp: 30 capsule, Rfl: 0 .  mometasone (ELOCON) 0.1 % cream, APPLY EXTERNALLY TO THE AFFECTED AREA TWICE DAILY, Disp: 45 g, Rfl: 0 .  olmesartan (BENICAR) 20 MG tablet, TAKE 1 TABLET BY MOUTH EVERY DAY, Disp: 90 tablet, Rfl: 0 .  promethazine (PHENERGAN) 12.5 MG tablet, Take 1 tablet (12.5 mg total) by mouth every 6 (six) hours as needed for nausea or vomiting., Disp: 20 tablet, Rfl: 0 .  simvastatin (ZOCOR) 20 MG tablet, TAKE 1 TABLET BY MOUTH EVERY DAY, Disp: 90 tablet, Rfl: 0 .  triamterene-hydrochlorothiazide (MAXZIDE-25) 37.5-25 MG tablet, TAKE 1/2 TABLET BY MOUTH EVERY DAY, Disp: 90 tablet, Rfl: 0 .  cephALEXin (KEFLEX) 250 MG capsule, Take by mouth 4 (four) times daily. (Patient not taking: Reported on 08/01/2019), Disp: , Rfl:   Current Facility-Administered Medications:  .  ipratropium-albuterol (DUONEB) 0.5-2.5 (3) MG/3ML nebulizer solution 3 mL, 3 mL, Nebulization, Once, Rodriguez-Southworth, Sunday Spillers, PA-C   Allergies  Allergen Reactions  . Codeine Nausea Only    unknown  . Tramadol Nausea Only     Review of Systems  Constitutional: Negative for chills and fever.  Respiratory: Positive for cough. Negative for shortness of breath.   Cardiovascular: Negative for chest pain.  Neurological: Negative for headaches.     Today's Vitals   08/01/19 1636  BP: 124/80  Pulse: 60  Temp: 98.3 F (36.8 C)  TempSrc: Oral  Weight: 189 lb 6.4 oz (85.9 kg)  Height: 5' 1.8" (1.57 m)  PainSc: 0-No pain   Body mass index is 34.87 kg/m.   Objective:  Physical Exam      Assessment And Plan:     1. Cough  Likely related to allergies no other symptoms and is dry  hacking cough - albuterol (VENTOLIN HFA) 108 (90 Base) MCG/ACT inhaler; Inhale 2 puffs into the lungs every 4 (four) hours as needed for wheezing or shortness of breath. Please educate how to use  Dispense: 18 g; Refill: 1 - cetirizine (ZYRTEC) 10 MG tablet; Take 1 tablet (10 mg total) by mouth daily.  Dispense: 90 tablet; Refill: 1  2. Non-seasonal allergic rhinitis due to pollen  Take antihistamine daily  - albuterol (VENTOLIN HFA) 108 (90 Base) MCG/ACT inhaler; Inhale 2 puffs into the lungs every 4 (four) hours as needed for wheezing or shortness of breath. Please educate how to use  Dispense: 18 g; Refill: 1 - cetirizine (ZYRTEC) 10 MG tablet; Take 1 tablet (10 mg total) by mouth daily.  Dispense: 90 tablet; Refill: 1   Arnette Felts, FNP    THE PATIENT IS ENCOURAGED TO PRACTICE SOCIAL DISTANCING DUE TO THE COVID-19 PANDEMIC.

## 2019-08-06 ENCOUNTER — Encounter: Payer: Self-pay | Admitting: Nurse Practitioner

## 2019-08-08 ENCOUNTER — Other Ambulatory Visit: Payer: Self-pay

## 2019-08-08 MED ORDER — AZITHROMYCIN 250 MG PO TABS
ORAL_TABLET | ORAL | 0 refills | Status: AC
Start: 2019-08-08 — End: 2019-08-13

## 2019-08-08 NOTE — Progress Notes (Signed)
az

## 2019-08-23 ENCOUNTER — Other Ambulatory Visit: Payer: Self-pay | Admitting: Nurse Practitioner

## 2019-08-23 DIAGNOSIS — R059 Cough, unspecified: Secondary | ICD-10-CM

## 2019-08-24 ENCOUNTER — Other Ambulatory Visit: Payer: Self-pay | Admitting: Nurse Practitioner

## 2019-08-28 ENCOUNTER — Telehealth: Payer: Self-pay

## 2019-08-28 ENCOUNTER — Other Ambulatory Visit: Payer: Self-pay | Admitting: Nurse Practitioner

## 2019-08-28 DIAGNOSIS — W57XXXA Bitten or stung by nonvenomous insect and other nonvenomous arthropods, initial encounter: Secondary | ICD-10-CM

## 2019-08-28 NOTE — Telephone Encounter (Signed)
PT CONTACTED TO CONFIRM THAT SHE WAS TRANSFERRING CARE TO [facility] PT CONFIRMED NEXT APPT W/MOORE CANCELLED.

## 2019-08-30 ENCOUNTER — Other Ambulatory Visit: Payer: Self-pay | Admitting: Nurse Practitioner

## 2019-08-30 DIAGNOSIS — I10 Essential (primary) hypertension: Secondary | ICD-10-CM

## 2019-09-12 ENCOUNTER — Ambulatory Visit (INDEPENDENT_AMBULATORY_CARE_PROVIDER_SITE_OTHER): Payer: Medicare Other | Admitting: Family

## 2019-09-12 ENCOUNTER — Other Ambulatory Visit: Payer: Self-pay

## 2019-09-12 ENCOUNTER — Encounter: Payer: Self-pay | Admitting: Family

## 2019-09-12 VITALS — BP 130/80 | HR 68 | Temp 97.5°F | Resp 16 | Ht 61.8 in | Wt 186.2 lb

## 2019-09-12 DIAGNOSIS — I10 Essential (primary) hypertension: Secondary | ICD-10-CM | POA: Diagnosis not present

## 2019-09-12 DIAGNOSIS — E0500 Thyrotoxicosis with diffuse goiter without thyrotoxic crisis or storm: Secondary | ICD-10-CM | POA: Diagnosis not present

## 2019-09-12 DIAGNOSIS — L308 Other specified dermatitis: Secondary | ICD-10-CM

## 2019-09-12 DIAGNOSIS — J302 Other seasonal allergic rhinitis: Secondary | ICD-10-CM | POA: Diagnosis not present

## 2019-09-12 DIAGNOSIS — H903 Sensorineural hearing loss, bilateral: Secondary | ICD-10-CM

## 2019-09-12 DIAGNOSIS — H6121 Impacted cerumen, right ear: Secondary | ICD-10-CM

## 2019-09-12 DIAGNOSIS — E785 Hyperlipidemia, unspecified: Secondary | ICD-10-CM

## 2019-09-12 DIAGNOSIS — Z780 Asymptomatic menopausal state: Secondary | ICD-10-CM

## 2019-09-12 DIAGNOSIS — T7840XD Allergy, unspecified, subsequent encounter: Secondary | ICD-10-CM

## 2019-09-12 MED ORDER — EPINEPHRINE 0.3 MG/0.3ML IJ SOAJ: 0.3000 mg | INTRAMUSCULAR | 1 refills | Status: DC | PRN

## 2019-09-12 NOTE — Progress Notes (Signed)
Provider: Marlowe Sax FNP-C   Evon Dejarnett, Nelda Bucks, NP  Patient Care Team: Micheala Morissette, Nelda Bucks, NP as PCP - General (Family Medicine) Harriett Sine, MD as Consulting Physician (Dermatology)  Extended Emergency Contact Information Primary Emergency Contact: [name] [place] of [place] Phone: [phone] Relation: Daughter Secondary Emergency Contact: Tama High [place] of [place] Mobile Phone: [phone] Relation: Daughter  Code Status:  Full Code  Goals of care: Advanced Directive information Advanced Directives 09/12/2019  Does Patient Have a Medical Advance Directive? No  Type of Advance Directive -  Does patient want to make changes to medical advance directive? -  Copy of [place] in Chart? -  Would patient like information on creating a medical advance directive? No - Patient declined     Chief Complaint  Patient presents with  . Establish Care    New Patient.    HPI:  Pt is a 84 y.o. female seen today for medical management of chronic diseases.she is here today with her daughter.she has a medical history of Hypertension,Graves disease,ezcema among other condition.  Hypertension - no home blood pressure for review.On atenolol 50 mg tablet daily and omlesartan 20 mg tablet daily.she denies any headache,dizziness,palpitation,chest pain or shortness of breath.  Hyperlipidemia - states has not had any lab work done this year.on simvastatin 20 mg tablet daily.states used to exercise by doing aerobic but has not been able to exercise due to COVID-19 pandemic.Feels like she gets exercise going up and down the stairs.Daughter states not eating enough vegetables in her diet.she eats more meat.  Has had some blurry vision.she will make her follow up appointment with Dr.Shapiro.  Has not seen a dentist since 2017.wears uppper partial and own teeth on the bottom.   Graves Disease - feels cold sometimes when [facility] is on. Has not  had her bone density since   Has had colonoscopy no longer required.Unclear when she had a mammogram but does not want any more.Will await medical records.   Past Medical History:  Diagnosis Date  . Eczema    Per [facility] new patient packet  . Graves disease    Per [facility] new patient packet  . High blood pressure    Per [facility] new patient packet  . Hypertension    Past Surgical History:  Procedure Laterality Date  . TOTAL ABDOMINAL HYSTERECTOMY Bilateral 1970's  . TUBAL LIGATION      Allergies  Allergen Reactions  . Codeine Nausea Only    unknown  . Tramadol Nausea Only    Allergies as of 09/12/2019      Reactions   Codeine Nausea Only   unknown   Tramadol Nausea Only      Medication List       Accurate as of September 12, 2019 10:15 PM. If you have any questions, ask your nurse or doctor.        STOP taking these medications   cephALEXin 250 MG capsule Commonly known as: KEFLEX Stopped by: Nelda Bucks Avaleigh Decuir, NP   ofloxacin 0.3 % ophthalmic solution Commonly known as: OCUFLOX Stopped by: Sandrea Hughs, NP   promethazine 12.5 MG tablet Commonly known as: PHENERGAN Stopped by: Sandrea Hughs, NP   triamterene-hydrochlorothiazide 37.5-25 MG tablet Commonly known as: MAXZIDE-25 Stopped by: Sandrea Hughs, NP     TAKE these medications   acetaminophen 650 MG CR tablet Commonly known as: TYLENOL Take 650 mg by mouth every 8 (eight) hours as needed for pain.   albuterol  108 (90 Base) MCG/ACT inhaler Commonly known as: VENTOLIN HFA Inhale 2 puffs into the lungs every 4 (four) hours as needed for wheezing or shortness of breath. Please educate how to use   aspirin EC 81 MG tablet Take 81 mg by mouth daily. Swallow whole.   atenolol 50 MG tablet Commonly known as: TENORMIN Take 1 tablet (50 mg total) by mouth daily.   augmented betamethasone dipropionate 0.05 % ointment Commonly known as: DIPROLENE-AF Apply 1 application topically daily.   baclofen 10 MG  tablet Commonly known as: LIORESAL Take 1 tablet (10 mg total) by mouth 3 (three) times daily as needed for muscle spasms.   cetirizine 10 MG tablet Commonly known as: ZYRTEC Take 1 tablet (10 mg total) by mouth daily.   doxycycline 100 MG tablet Commonly known as: VIBRA-TABS Take 100 mg by mouth 2 (two) times daily.   EPINEPHrine 0.3 mg/0.3 mL Soaj injection Commonly known as: EpiPen 2-Pak Inject 0.3 mLs (0.3 mg total) into the muscle as needed. Inject 0.77m intramuscular route once as needed.   hydrOXYzine 25 MG capsule Commonly known as: VISTARIL Take 1 capsule (25 mg total) by mouth 2 (two) times daily as needed for itching.   mometasone 0.1 % cream Commonly known as: ELOCON APPLY TOPICALLY TO THE AFFECTED AREA TWICE DAILY   olmesartan 20 MG tablet Commonly known as: BENICAR TAKE 1 TABLET BY MOUTH EVERY DAY   predniSONE 20 MG tablet Commonly known as: DELTASONE Take 20 mg by mouth daily.   simvastatin 20 MG tablet Commonly known as: ZOCOR TAKE 1 TABLET BY MOUTH EVERY DAY       Review of Systems  Constitutional: Negative for appetite change, chills, fatigue and fever.  HENT: Positive for hearing loss and rhinorrhea. Negative for congestion, postnasal drip, sinus pressure, sinus pain, sneezing, sore throat and trouble swallowing.   Eyes: Positive for visual disturbance. Negative for discharge, redness and itching.       Blurry vision  Respiratory: Negative for cough, chest tightness, shortness of breath and wheezing.   Cardiovascular: Negative for chest pain, palpitations and leg swelling.  Gastrointestinal: Negative for abdominal distention, abdominal pain, constipation, diarrhea, nausea and vomiting.  Endocrine: Negative for cold intolerance, heat intolerance, polydipsia, polyphagia and polyuria.  Genitourinary: Negative for decreased urine volume, difficulty urinating, dysuria, flank pain, frequency, urgency and vaginal bleeding.  Musculoskeletal: Negative for  arthralgias, back pain and gait problem.  Skin: Negative for color change, pallor and rash.  Neurological: Negative for dizziness, speech difficulty, weakness, light-headedness, numbness and headaches.  Hematological: Does not bruise/bleed easily.  Psychiatric/Behavioral: Positive for dysphoric mood. Negative for agitation, confusion, hallucinations and sleep disturbance. The patient is not nervous/anxious.     Immunization History  Administered Date(s) Administered  . Fluad Quad(high Dose 65+) 10/11/2018  . Influenza, High Dose Seasonal PF 12/03/2015  . Influenza,inj,Quad PF,6+ Mos 10/11/2018  . Influenza-Unspecified 11/16/2017  . PFIZER SARS-COV-2 Vaccination 02/18/2019, 03/11/2019  . Pneumococcal Conjugate-13 10/11/2018  . Tdap 10/11/2018   Pertinent  Health Maintenance Due  Topic Date Due  . INFLUENZA VACCINE  09/10/2019  . DEXA SCAN  Completed  . PNA vac Low Risk Adult  Completed   Fall Risk  09/12/2019 12/19/2018 11/14/2018 09/08/2018 06/09/2018  Falls in the past year? 0 0 0 0 0  Number falls in past yr: 0 - - - -  Injury with Fall? 0 - - - -  Risk for fall due to : - - - Medication side effect -  Follow  up - - - Falls evaluation completed;Education provided;Falls prevention discussed -    Vitals:   09/12/19 1059  BP: 130/80  Pulse: 68  Resp: 16  Temp: (!) 97.5 F (36.4 C)  SpO2: 92%  Weight: 186 lb 3.2 oz (84.5 kg)  Height: 5' 1.8" (1.57 m)   Body mass index is 34.28 kg/m. Physical Exam Vitals reviewed.  Constitutional:      General: She is not in acute distress.    Appearance: She is obese.  HENT:     Head: Normocephalic.     Right Ear: There is impacted cerumen.     Left Ear: Tympanic membrane, ear canal and external ear normal. There is no impacted cerumen.     Ears:     Comments: Right ear lavage with warm water and hydrogen peroxide moderate amounts of cerumen obtained.No instrument used.Tolerated procedure well.TM clear.     Nose: Nose normal. No  congestion or rhinorrhea.     Mouth/Throat:     Mouth: Mucous membranes are moist.     Pharynx: Oropharynx is clear. No oropharyngeal exudate or posterior oropharyngeal erythema.  Eyes:     General: No scleral icterus.       Right eye: No discharge.        Left eye: No discharge.     Extraocular Movements: Extraocular movements intact.     Conjunctiva/sclera: Conjunctivae normal.     Pupils: Pupils are equal, round, and reactive to light.  Neck:     Vascular: No carotid bruit.  Cardiovascular:     Rate and Rhythm: Normal rate and regular rhythm.     Pulses: Normal pulses.     Heart sounds: Normal heart sounds. No murmur heard.  No friction rub. No gallop.   Pulmonary:     Effort: Pulmonary effort is normal. No respiratory distress.     Breath sounds: Normal breath sounds. No wheezing, rhonchi or rales.  Chest:     Chest wall: No tenderness.  Abdominal:     General: Bowel sounds are normal. There is no distension.     Palpations: Abdomen is soft. There is no mass.     Tenderness: There is no abdominal tenderness. There is no right CVA tenderness, left CVA tenderness, guarding or rebound.  Musculoskeletal:        General: No swelling, tenderness or deformity. Normal range of motion.     Cervical back: Normal range of motion. No rigidity or tenderness.     Right lower leg: No edema.     Left lower leg: No edema.  Lymphadenopathy:     Cervical: No cervical adenopathy.  Skin:    General: Skin is warm.     Coloration: Skin is not pale.     Findings: No bruising, erythema or rash.  Neurological:     Mental Status: She is alert and oriented to person, place, and time.     Cranial Nerves: No cranial nerve deficit.     Sensory: No sensory deficit.     Motor: No weakness.     Coordination: Coordination normal.     Gait: Gait normal.  Psychiatric:        Mood and Affect: Mood normal.        Behavior: Behavior normal.        Thought Content: Thought content normal.         Judgment: Judgment normal.     Labs reviewed:  Lab Results  Component Value Date   HGBA1C 6.0 (H)  06/13/2018   Significant Diagnostic Results in last 30 days:  No results found.  Assessment/Plan 1. Essential hypertension B/p at goal.continue on atenolol and olmesartan. - continue on ASA and Statin for cardiovascular event prophylaxis. - CBC with Differential/Platelet; Future - CMP with eGFR([facility]); Future  2. Hyperlipidemia LDL goal <100 No recent LDL for review.dietary modification and exercise advised. - Lipid panel; Future  3. Graves disease - TSH; Future  4. Other eczema Had eczema during childhood then resolved but returned as an adult. - continue to follow up with dermatologist.Has dry areas on feet.continue on Augmented betamethasone and mometasone cream.  5. Seasonal allergies Symptoms well control on zyrtec and Benadryl.Advised to use Zyrtec and not beadryl due to high risk for drowsiness and risk for falls.  6. Allergic reaction, subsequent encounter Allergic reaction associated with allergies.requires refil. - EPINEPHrine (EPIPEN 2-PAK) 0.3 mg/0.3 mL IJ SOAJ injection; Inject 0.3 mLs (0.3 mg total) into the muscle as needed. Inject 0.11m intramuscular route once as needed.  Dispense: 1 each; Refill: 1  7. Postmenopausal estrogen deficiency No recent falls or fracture. - DG Bone Density; Future  8. Sensorineural hearing loss (SNHL) of both ears HOH bilateral hearing aids in place.  9. Impacted cerumen of right ear Right ear lavaged with warm water and hydrogen peroxide moderate amounts of cerumen obtained.Tolerated procedure well.No signs of infections.  Family/ staff Communication: Reviewed plan of care with patient and Daughter.  Labs/tests ordered:  - CBC with Differential/Platelet; Future - CMP with eGFR([facility]); Future - Lipid panel; Future - TSH; Future - DG Bone Density; Future  Next Appointment : 6 months for medical management of chronic  issues.Fasting labs in 1 week    DSandrea Hughs NP

## 2019-09-13 ENCOUNTER — Encounter: Payer: Medicare Other | Admitting: Nurse Practitioner

## 2019-09-13 ENCOUNTER — Ambulatory Visit: Payer: Medicare Other | Admitting: Nurse Practitioner

## 2019-09-13 ENCOUNTER — Ambulatory Visit: Payer: Medicare Other

## 2019-09-15 ENCOUNTER — Other Ambulatory Visit: Payer: Medicare Other

## 2019-09-29 ENCOUNTER — Other Ambulatory Visit: Payer: Medicare Other

## 2019-09-29 ENCOUNTER — Other Ambulatory Visit: Payer: Self-pay

## 2019-09-29 DIAGNOSIS — R739 Hyperglycemia, unspecified: Secondary | ICD-10-CM | POA: Diagnosis not present

## 2019-09-29 DIAGNOSIS — E785 Hyperlipidemia, unspecified: Secondary | ICD-10-CM | POA: Diagnosis not present

## 2019-09-29 DIAGNOSIS — E0500 Thyrotoxicosis with diffuse goiter without thyrotoxic crisis or storm: Secondary | ICD-10-CM | POA: Diagnosis not present

## 2019-09-29 DIAGNOSIS — I10 Essential (primary) hypertension: Secondary | ICD-10-CM

## 2019-10-04 LAB — CBC WITH DIFFERENTIAL/PLATELET
Absolute Monocytes: 437 cells/uL (ref 200–950)
Basophils Absolute: 30 cells/uL (ref 0–200)
Basophils Relative: 0.5 %
Eosinophils Absolute: 230 cells/uL (ref 15–500)
Eosinophils Relative: 3.9 %
HCT: 39.4 % (ref 35.0–45.0)
Hemoglobin: 12.8 g/dL (ref 11.7–15.5)
Lymphs Abs: 1322 cells/uL (ref 850–3900)
MCH: 29.5 pg (ref 27.0–33.0)
MCHC: 32.5 g/dL (ref 32.0–36.0)
MCV: 90.8 fL (ref 80.0–100.0)
MPV: 9.8 fL (ref 7.5–12.5)
Monocytes Relative: 7.4 %
Neutro Abs: 3882 cells/uL (ref 1500–7800)
Neutrophils Relative %: 65.8 %
Platelets: 257 10*3/uL (ref 140–400)
RBC: 4.34 10*6/uL (ref 3.80–5.10)
RDW: 12.5 % (ref 11.0–15.0)
Total Lymphocyte: 22.4 %
WBC: 5.9 10*3/uL (ref 3.8–10.8)

## 2019-10-04 LAB — TEST AUTHORIZATION

## 2019-10-04 LAB — COMPLETE METABOLIC PANEL WITH GFR
AG Ratio: 1.2 (calc) (ref 1.0–2.5)
ALT: 9 U/L (ref 6–29)
AST: 14 U/L (ref 10–35)
Albumin: 3.8 g/dL (ref 3.6–5.1)
Alkaline phosphatase (APISO): 103 U/L (ref 37–153)
BUN/Creatinine Ratio: 18 (calc) (ref 6–22)
BUN: 19 mg/dL (ref 7–25)
CO2: 26 mmol/L (ref 20–32)
Calcium: 8.8 mg/dL (ref 8.6–10.4)
Chloride: 105 mmol/L (ref 98–110)
Creat: 1.05 mg/dL — ABNORMAL HIGH (ref 0.60–0.88)
GFR, Est African American: 57 mL/min/{1.73_m2} — ABNORMAL LOW (ref >=60)
GFR, Est Non African American: 49 mL/min/{1.73_m2} — ABNORMAL LOW (ref >=60)
Globulin: 3.2 g/dL (calc) (ref 1.9–3.7)
Glucose, Bld: 108 mg/dL — ABNORMAL HIGH (ref 65–99)
Potassium: 4.2 mmol/L (ref 3.5–5.3)
Sodium: 139 mmol/L (ref 135–146)
Total Bilirubin: 0.3 mg/dL (ref 0.2–1.2)
Total Protein: 7 g/dL (ref 6.1–8.1)

## 2019-10-04 LAB — LIPID PANEL
Cholesterol: 164 mg/dL (ref <200)
HDL: 55 mg/dL (ref >=50)
LDL Cholesterol (Calc): 90 mg/dL (calc)
Non-HDL Cholesterol (Calc): 109 mg/dL (calc) (ref <130)
Total CHOL/HDL Ratio: 3 (calc) (ref <5.0)
Triglycerides: 91 mg/dL (ref <150)

## 2019-10-04 LAB — HEMOGLOBIN A1C
Hgb A1c MFr Bld: 5.9 % of total Hgb — ABNORMAL HIGH (ref <5.7)
Mean Plasma Glucose: 123 (calc)
eAG (mmol/L): 6.8 (calc)

## 2019-10-04 LAB — TSH: TSH: 1.21 mIU/L (ref 0.40–4.50)

## 2019-11-22 ENCOUNTER — Other Ambulatory Visit: Payer: Self-pay | Admitting: Nurse Practitioner

## 2019-12-12 ENCOUNTER — Other Ambulatory Visit: Payer: Medicare Other

## 2019-12-12 ENCOUNTER — Other Ambulatory Visit: Payer: Self-pay | Admitting: Nurse Practitioner

## 2019-12-12 DIAGNOSIS — W57XXXA Bitten or stung by nonvenomous insect and other nonvenomous arthropods, initial encounter: Secondary | ICD-10-CM

## 2019-12-28 ENCOUNTER — Other Ambulatory Visit: Payer: Self-pay | Admitting: Nurse Practitioner

## 2019-12-28 DIAGNOSIS — W57XXXA Bitten or stung by nonvenomous insect and other nonvenomous arthropods, initial encounter: Secondary | ICD-10-CM

## 2019-12-29 ENCOUNTER — Other Ambulatory Visit: Payer: Self-pay | Admitting: Nurse Practitioner

## 2020-01-12 DIAGNOSIS — L301 Dyshidrosis [pompholyx]: Secondary | ICD-10-CM | POA: Diagnosis not present

## 2020-01-30 ENCOUNTER — Other Ambulatory Visit: Payer: Self-pay | Admitting: Internal Medicine

## 2020-01-30 ENCOUNTER — Other Ambulatory Visit: Payer: Self-pay | Admitting: Nurse Practitioner

## 2020-01-30 DIAGNOSIS — I10 Essential (primary) hypertension: Secondary | ICD-10-CM

## 2020-02-01 ENCOUNTER — Other Ambulatory Visit: Payer: Self-pay | Admitting: *Deleted

## 2020-02-01 DIAGNOSIS — I10 Essential (primary) hypertension: Secondary | ICD-10-CM

## 2020-02-01 MED ORDER — ATENOLOL 50 MG PO TABS: 50.0000 mg | ORAL_TABLET | Freq: Every day | ORAL | 1 refills | Status: DC

## 2020-02-01 NOTE — Telephone Encounter (Signed)
Daughter requested Triamterene 37.5 refill. Informed daughter and patient (2-way call) that medication was D/C at last appointment and not in current medication list. Agreed.    OV Note Dated 09/12/2019: Medication Changes      (Completed Course)  Patient not taking:  Reported on 08/01/2019     (Completed Course)     (Completed Course)     (Completed Course)

## 2020-02-01 NOTE — Telephone Encounter (Signed)
Patient daughter requested refill.  

## 2020-02-22 ENCOUNTER — Other Ambulatory Visit: Payer: Self-pay

## 2020-02-22 ENCOUNTER — Encounter: Payer: Self-pay | Admitting: Family

## 2020-02-22 ENCOUNTER — Telehealth: Payer: Self-pay | Admitting: *Deleted

## 2020-02-22 ENCOUNTER — Ambulatory Visit (INDEPENDENT_AMBULATORY_CARE_PROVIDER_SITE_OTHER): Payer: Medicare Other | Admitting: Family

## 2020-02-22 VITALS — BP 150/89 | HR 81 | Temp 97.7°F | Resp 16 | Ht 61.8 in | Wt 184.6 lb

## 2020-02-22 DIAGNOSIS — I10 Essential (primary) hypertension: Secondary | ICD-10-CM

## 2020-02-22 DIAGNOSIS — R42 Dizziness and giddiness: Secondary | ICD-10-CM | POA: Diagnosis not present

## 2020-02-22 DIAGNOSIS — T7840XD Allergy, unspecified, subsequent encounter: Secondary | ICD-10-CM | POA: Diagnosis not present

## 2020-02-22 MED ORDER — CLONIDINE HCL 0.1 MG PO TABS
0.1000 mg | ORAL_TABLET | Freq: Once | ORAL | Status: AC
  Administered 2020-02-22: 0.1 mg via ORAL

## 2020-02-22 MED ORDER — HYDROXYZINE PAMOATE 25 MG PO CAPS: 25.0000 mg | ORAL_CAPSULE | Freq: Two times a day (BID) | ORAL | 1 refills | Status: DC | PRN

## 2020-02-22 MED ORDER — BETAMETHASONE DIPROPIONATE AUG 0.05 % EX OINT: 1.0000 "application " | TOPICAL_OINTMENT | Freq: Every day | CUTANEOUS | 1 refills | Status: DC

## 2020-02-22 MED ORDER — EPINEPHRINE 0.3 MG/0.3ML IJ SOAJ: 0.3000 mg | INTRAMUSCULAR | 1 refills | Status: AC | PRN

## 2020-02-22 NOTE — Patient Instructions (Signed)
-   check blood pressure daily.Notify provider if blood pressure > 140/90

## 2020-02-22 NOTE — Telephone Encounter (Signed)
Okay.Make sure she rest for a few minutes prior to taking blood pressure too.

## 2020-02-22 NOTE — Progress Notes (Signed)
Provider: Richarda Blade FNP-C  Shanay Woolman, Donalee Citrin, NP  Patient Care Team: Kyliah Deanda, Donalee Citrin, NP as PCP - General (Family Medicine) Aris Lot, MD as Consulting Physician (Dermatology)  Extended Emergency Contact Information Primary Emergency Contact: Terance Hart [place] of [place] Phone: [phone] Relation: Daughter Secondary Emergency Contact: Robbie Lis [place] of [place] Mobile Phone: [phone] Relation: Daughter  Code Status:  Full Code  Goals of care: Advanced Directive information Advanced Directives 02/22/2020  Does Patient Have a Medical Advance Directive? No  Type of Advance Directive -  Does patient want to make changes to medical advance directive? -  Copy of Healthcare Power of Attorney in Chart? -  Would patient like information on creating a medical advance directive? No - Patient declined     Chief Complaint  Patient presents with  . Acute Visit    Complains of Dizziness.    HPI:  Pt is a 85 y.o. female seen today for an acute visit for evaluation of dizziness x 3 days.she is here with her daughter who provides additional HPI information.Has not been taking her blood pressure medication as required.Gets busy taking care of her foster child and forgets to take her meds.she was changing the child when she got up to turn felt dizzy.No home B/p readings for evaluation.Has blood pressure cuff at home. Daughter states used to call and check whether she has taken her medication but due to COVID-19 pandemic everyone has been busy.Has a son who also checks to make sure she takes her medication.Does live with Grandson.  She denies any headache,nausea,voiting,chest pain, palpitation or weakness.right fingers sometimes get numb but this is ongoing not new symptoms.   She request Epi pen,Hydroxyzine and betamethasone cream refilled.   Past Medical History:  Diagnosis Date  . Eczema    Per [facility] new patient packet  . Graves disease     Per [facility] new patient packet  . High blood pressure    Per [facility] new patient packet  . Hypertension    Past Surgical History:  Procedure Laterality Date  . TOTAL ABDOMINAL HYSTERECTOMY Bilateral 1970's  . TUBAL LIGATION      Allergies  Allergen Reactions  . Codeine Nausea Only    unknown  . Tramadol Nausea Only    Outpatient Encounter Medications as of 02/22/2020  Medication Sig  . acetaminophen (TYLENOL) 650 MG CR tablet Take 650 mg by mouth every 8 (eight) hours as needed for pain.  Marland Kitchen albuterol (VENTOLIN HFA) 108 (90 Base) MCG/ACT inhaler Inhale 2 puffs into the lungs every 4 (four) hours as needed for wheezing or shortness of breath. Please educate how to use  . aspirin EC 81 MG tablet Take 81 mg by mouth daily. Swallow whole.  Marland Kitchen atenolol (TENORMIN) 50 MG tablet Take 1 tablet (50 mg total) by mouth daily.  . cetirizine (ZYRTEC) 10 MG tablet Take 1 tablet (10 mg total) by mouth daily.  . mometasone (ELOCON) 0.1 % cream APPLY TOPICALLY TO THE AFFECTED AREA TWICE DAILY  . olmesartan (BENICAR) 20 MG tablet TAKE 1 TABLET BY MOUTH EVERY DAY  . simvastatin (ZOCOR) 20 MG tablet TAKE 1 TABLET BY MOUTH EVERY DAY  . [DISCONTINUED] augmented betamethasone dipropionate (DIPROLENE-AF) 0.05 % ointment Apply 1 application topically daily.   . [DISCONTINUED] EPINEPHrine (EPIPEN 2-PAK) 0.3 mg/0.3 mL IJ SOAJ injection Inject 0.3 mLs (0.3 mg total) into the muscle as needed. Inject 0.3mg  intramuscular route once as needed.  . [DISCONTINUED] hydrOXYzine (VISTARIL) 25 MG capsule Take 1  capsule (25 mg total) by mouth 2 (two) times daily as needed for itching.  Marland Kitchen augmented betamethasone dipropionate (DIPROLENE-AF) 0.05 % ointment Apply 1 application topically daily.  Marland Kitchen EPINEPHrine (EPIPEN 2-PAK) 0.3 mg/0.3 mL IJ SOAJ injection Inject 0.3 mg into the muscle as needed. Inject 0.3mg  intramuscular route once as needed.  . hydrOXYzine (VISTARIL) 25 MG capsule Take 1 capsule (25 mg total) by mouth 2 (two) times  daily as needed for itching.  . [DISCONTINUED] doxycycline (VIBRA-TABS) 100 MG tablet Take 100 mg by mouth 2 (two) times daily. (Patient not taking: Reported on 09/12/2019)  . [DISCONTINUED] mupirocin ointment (BACTROBAN) 2 % Apply 1 application topically 2 (two) times daily.  . [DISCONTINUED] predniSONE (DELTASONE) 20 MG tablet Take 20 mg by mouth daily.  (Patient not taking: Reported on 09/12/2019)   Facility-Administered Encounter Medications as of 02/22/2020  Medication  . [COMPLETED] cloNIDine (CATAPRES) tablet 0.1 mg  . ipratropium-albuterol (DUONEB) 0.5-2.5 (3) MG/3ML nebulizer solution 3 mL    Review of Systems  Constitutional: Negative for appetite change, chills, fatigue and fever.  HENT: Negative for congestion, rhinorrhea, sinus pressure, sinus pain, sneezing and sore throat.   Respiratory: Negative for cough, chest tightness, shortness of breath and wheezing.   Cardiovascular: Negative for chest pain, palpitations and leg swelling.  Gastrointestinal: Negative for abdominal distention, abdominal pain, constipation, diarrhea, nausea and vomiting.       Prunes effective for constipation   Endocrine: Negative for cold intolerance, heat intolerance, polydipsia, polyphagia and polyuria.  Genitourinary: Negative for difficulty urinating, dysuria, flank pain, frequency and urgency.  Musculoskeletal: Negative for arthralgias and gait problem.  Skin: Negative for color change, pallor and rash.  Neurological: Negative for speech difficulty, weakness, light-headedness, numbness and headaches.       Dizziness   Hematological: Does not bruise/bleed easily.  Psychiatric/Behavioral: Negative for agitation, behavioral problems, decreased concentration and sleep disturbance.    Immunization History  Administered Date(s) Administered  . Fluad Quad(high Dose 65+) 10/11/2018  . Influenza, High Dose Seasonal PF 12/03/2015, 11/07/2019  . Influenza,inj,Quad PF,6+ Mos 10/11/2018  .  Influenza-Unspecified 11/16/2017  . PFIZER SARS-COV-2 Vaccination 02/18/2019, 03/11/2019  . Pneumococcal Conjugate-13 10/11/2018  . Tdap 10/11/2018   Pertinent  Health Maintenance Due  Topic Date Due  . INFLUENZA VACCINE  Completed  . DEXA SCAN  Completed  . PNA vac Low Risk Adult  Completed   Fall Risk  02/22/2020 09/12/2019 12/19/2018 11/14/2018 09/08/2018  Falls in the past year? 0 0 0 0 0  Number falls in past yr: 0 0 - - -  Injury with Fall? 0 0 - - -  Risk for fall due to : - - - - Medication side effect  Follow up - - - - Falls evaluation completed;Education provided;Falls prevention discussed   Functional Status Survey:    Vitals:   02/22/20 1103 02/22/20 1158  BP: (!) 200/90 (!) 150/89  Pulse: 81   Resp: 16   Temp: 97.7 F (36.5 C)   SpO2: 91%   Weight: 184 lb 9.6 oz (83.7 kg)   Height: 5' 1.8" (1.57 m)    Body mass index is 33.98 kg/m. Physical Exam Vitals reviewed.  Constitutional:      General: She is not in acute distress.    Appearance: She is obese. She is not ill-appearing.  HENT:     Head: Normocephalic.     Right Ear: Tympanic membrane, ear canal and external ear normal. There is no impacted cerumen.     Left  Ear: Tympanic membrane, ear canal and external ear normal. There is no impacted cerumen.     Nose: Nose normal. No congestion or rhinorrhea.     Mouth/Throat:     Mouth: Mucous membranes are moist.     Pharynx: Oropharynx is clear. No oropharyngeal exudate or posterior oropharyngeal erythema.  Eyes:     General: No scleral icterus.       Right eye: No discharge.        Left eye: No discharge.     Extraocular Movements: Extraocular movements intact.     Conjunctiva/sclera: Conjunctivae normal.     Pupils: Pupils are equal, round, and reactive to light.  Neck:     Vascular: No carotid bruit.  Cardiovascular:     Rate and Rhythm: Normal rate and regular rhythm.     Pulses: Normal pulses.     Heart sounds: Normal heart sounds. No murmur  heard. No friction rub. No gallop.   Pulmonary:     Effort: Pulmonary effort is normal. No respiratory distress.     Breath sounds: Normal breath sounds. No wheezing, rhonchi or rales.  Chest:     Chest wall: No tenderness.  Abdominal:     General: Bowel sounds are normal. There is no distension.     Palpations: Abdomen is soft. There is no mass.     Tenderness: There is no abdominal tenderness. There is no right CVA tenderness, left CVA tenderness, guarding or rebound.  Musculoskeletal:        General: No swelling or tenderness. Normal range of motion.     Cervical back: Normal range of motion. No rigidity or tenderness.     Right lower leg: No edema.     Left lower leg: No edema.  Lymphadenopathy:     Cervical: No cervical adenopathy.  Skin:    General: Skin is warm and dry.     Coloration: Skin is not pale.     Findings: No bruising, erythema or rash.  Neurological:     Mental Status: She is alert and oriented to person, place, and time.     Cranial Nerves: No cranial nerve deficit.     Motor: No weakness.     Coordination: Coordination normal.     Gait: Gait normal.  Psychiatric:        Mood and Affect: Mood normal.        Behavior: Behavior normal.        Thought Content: Thought content normal.        Judgment: Judgment normal.     Labs reviewed: Recent Labs    09/29/19 0926  NA 139  K 4.2  CL 105  CO2 26  GLUCOSE 108*  BUN 19  CREATININE 1.05*  CALCIUM 8.8   Recent Labs    09/29/19 0926  AST 14  ALT 9  BILITOT 0.3  PROT 7.0   Recent Labs    09/29/19 0926  WBC 5.9  NEUTROABS 3,882  HGB 12.8  HCT 39.4  MCV 90.8  PLT 257   Lab Results  Component Value Date   TSH 1.21 09/29/2019   Lab Results  Component Value Date   HGBA1C 5.9 (H) 09/29/2019   Lab Results  Component Value Date   CHOL 164 09/29/2019   HDL 55 09/29/2019   LDLCALC 90 09/29/2019   TRIG 91 09/29/2019   CHOLHDL 3.0 09/29/2019    Significant Diagnostic Results in last 30  days:  No results found.  Assessment/Plan  1.  Hypertension, unspecified type B/p elevated on arrival.Clonidine 0.1 mg tablet given by CMA.B/p recheck down 150/89. - Advised to take her B/p medication as prescribed.Daughter and other family members will also try to remind her to take her medication. - Advised to check B/p daily at home and notify provider if >140/90  - continue on olmesartan and atenolol  - cloNIDine (CATAPRES) tablet 0.1 mg - will follow up in 2 weeks for revaluation.   2. Allergic reaction, subsequent encounter Request Epen refill. No recent episode of reaction reported.  - EPINEPHrine (EPIPEN 2-PAK) 0.3 mg/0.3 mL IJ SOAJ injection; Inject 0.3 mg into the muscle as needed. Inject 0.3mg  intramuscular route once as needed.  Dispense: 1 each; Refill: 1  3. Dizziness Unclear etiology though suspect due to high blood pressure since she has not been taking her blood pressure medication as required. No symptoms during visit from lying to sitting and standing position.  - encouraged to take her B/p medication as prescribed. - Encouraged to avoid abrupt change of position  - increase water intake to 6-8 glasses daily  Will obtain EKG and CBC if dizziness persist   Family/ staff Communication: Reviewed plan of care with patient and daughter.   Labs/tests ordered: None   Next Appointment: 2 weeks for B/p evaluation and dizziness.Advised to bring home B/p log to visit.   Caesar Bookman, NP

## 2020-02-22 NOTE — Telephone Encounter (Signed)
Patient daughter aware. °

## 2020-02-22 NOTE — Telephone Encounter (Signed)
Cordelia Pen, daughter, called and stated that she just wanted to let you know that patient went home and took her Atenolol and Olmesartan and daughter took patient's blood pressure 2 hours afterwards and it was 161/77. Stated that she is going to take the BP monitor to pharmacy to make sure it is running right and she is going to keep logs to bring to appointment in 2 weeks.   FYI

## 2020-02-23 ENCOUNTER — Other Ambulatory Visit: Payer: Self-pay | Admitting: Nurse Practitioner

## 2020-02-23 DIAGNOSIS — W57XXXA Bitten or stung by nonvenomous insect and other nonvenomous arthropods, initial encounter: Secondary | ICD-10-CM

## 2020-02-28 ENCOUNTER — Other Ambulatory Visit: Payer: Self-pay | Admitting: *Deleted

## 2020-02-28 DIAGNOSIS — W57XXXA Bitten or stung by nonvenomous insect and other nonvenomous arthropods, initial encounter: Secondary | ICD-10-CM

## 2020-02-28 MED ORDER — MOMETASONE FUROATE 0.1 % EX CREA
TOPICAL_CREAM | CUTANEOUS | 1 refills | Status: DC
Start: 2020-02-28 — End: 2021-01-31

## 2020-02-28 NOTE — Telephone Encounter (Signed)
Patient caregiver called and stated that patient is requesting the Mometasone cream, stated that it works the best on her skin. Stated that she was seen on 1/13 and requested a refill but it was not received at pharmacy. Stated that the Ointment does not work as well.  Pended Rx and sent to [facility] for approval.

## 2020-03-01 ENCOUNTER — Telehealth: Payer: Self-pay | Admitting: Family

## 2020-03-01 NOTE — Telephone Encounter (Signed)
Rx was approved 2 days ago 02/28/20, sent to [facility].  I called patient and left a detailed message informing her rx was approved on the 19th and the system shows confirmation:    mometasone (ELOCON) 0.1 % cream 45 g 1 02/28/2020    Sig: Apply topically to the affected area twice daily.   Sent to pharmacy as: mometasone (ELOCON) 0.1 % cream   E-Prescribing Status: Receipt confirmed by pharmacy (02/28/2020 12:28 PM EST)

## 2020-03-01 NOTE — Telephone Encounter (Signed)
Noted  

## 2020-03-01 NOTE — Telephone Encounter (Signed)
Patient is needing the cream momentasone

## 2020-03-04 ENCOUNTER — Other Ambulatory Visit: Payer: Self-pay | Admitting: Nurse Practitioner

## 2020-03-04 DIAGNOSIS — M545 Low back pain, unspecified: Secondary | ICD-10-CM

## 2020-03-07 ENCOUNTER — Ambulatory Visit: Payer: Medicare Other | Admitting: Family

## 2020-03-11 ENCOUNTER — Other Ambulatory Visit: Payer: Self-pay

## 2020-03-11 ENCOUNTER — Ambulatory Visit (INDEPENDENT_AMBULATORY_CARE_PROVIDER_SITE_OTHER): Payer: Medicare Other | Admitting: Family

## 2020-03-11 ENCOUNTER — Encounter: Payer: Self-pay | Admitting: Family

## 2020-03-11 VITALS — BP 150/98 | HR 75 | Temp 97.1°F | Resp 16 | Ht 61.8 in | Wt 185.2 lb

## 2020-03-11 DIAGNOSIS — J301 Allergic rhinitis due to pollen: Secondary | ICD-10-CM

## 2020-03-11 DIAGNOSIS — I10 Essential (primary) hypertension: Secondary | ICD-10-CM | POA: Diagnosis not present

## 2020-03-11 DIAGNOSIS — R059 Cough, unspecified: Secondary | ICD-10-CM

## 2020-03-11 MED ORDER — ALBUTEROL SULFATE HFA 108 (90 BASE) MCG/ACT IN AERS: 2.0000 | INHALATION_SPRAY | RESPIRATORY_TRACT | 3 refills | Status: DC | PRN

## 2020-03-11 MED ORDER — IPRATROPIUM BROMIDE 0.03 % NA SOLN: 2.0000 | Freq: Two times a day (BID) | NASAL | 3 refills | Status: DC

## 2020-03-11 MED ORDER — OLMESARTAN MEDOXOMIL 20 MG PO TABS: 20.0000 mg | ORAL_TABLET | Freq: Every day | ORAL | 1 refills | Status: DC

## 2020-03-11 NOTE — Patient Instructions (Signed)
-   Check Blood pressure daily and notify provider if > 140/90

## 2020-03-11 NOTE — Progress Notes (Signed)
Provider: Richarda Blade FNP-C  Sreenidhi Ganson, Donalee Citrin, NP  Patient Care Team: Zhyon Antenucci, Donalee Citrin, NP as PCP - General (Family Medicine) Aris Lot, MD as Consulting Physician (Dermatology)  Extended Emergency Contact Information Primary Emergency Contact: Terance Hart [place] of [place] Phone: [phone] Relation: Daughter Secondary Emergency Contact: Robbie Lis [place] of [place] Mobile Phone: [phone] Relation: Daughter  Code Status: Full Code  Goals of care: Advanced Directive information Advanced Directives 03/11/2020  Does Patient Have a Medical Advance Directive? No  Type of Advance Directive -  Does patient want to make changes to medical advance directive? -  Copy of Healthcare Power of Attorney in Chart? -  Would patient like information on creating a medical advance directive? No - Patient declined     Chief Complaint  Patient presents with  . Follow-up    2 week follow up.    HPI:  Pt is a 85 y.o. female seen today for an acute visit for 2 weeks follow up for blood pressure.she is here with her daughter.she was here 02/22/2020 with complains of dizziness.she was forgetting to take her B/p medication due to busy caring for her foster child.Her B/p was 200/90 clonidine was given at the office B/p went down to 150/89.she was advised to take her B/p medication as directed then follow up today. She states dizziness has resolved.Had headache one week ago.Had nasal congestion,room was hot she used ipratropium nasal spray with much improvement.  Her blood pressure today has improved though not at goal.No headache,dizziness,chest pain,palpitation or shortness of breath.    Past Medical History:  Diagnosis Date  . Eczema    Per [facility] new patient packet  . Graves disease    Per [facility] new patient packet  . High blood pressure    Per [facility] new patient packet  . Hypertension    Past Surgical History:  Procedure Laterality Date  . TOTAL  ABDOMINAL HYSTERECTOMY Bilateral 1970's  . TUBAL LIGATION      Allergies  Allergen Reactions  . Codeine Nausea Only    unknown  . Tramadol Nausea Only    Outpatient Encounter Medications as of 03/11/2020  Medication Sig  . acetaminophen (TYLENOL) 650 MG CR tablet Take 650 mg by mouth every 8 (eight) hours as needed for pain.  Marland Kitchen albuterol (VENTOLIN HFA) 108 (90 Base) MCG/ACT inhaler Inhale 2 puffs into the lungs every 4 (four) hours as needed for wheezing or shortness of breath. Please educate how to use  . aspirin EC 81 MG tablet Take 81 mg by mouth daily. Swallow whole.  Marland Kitchen atenolol (TENORMIN) 50 MG tablet Take 1 tablet (50 mg total) by mouth daily.  Marland Kitchen augmented betamethasone dipropionate (DIPROLENE-AF) 0.05 % ointment Apply 1 application topically daily.  . cetirizine (ZYRTEC) 10 MG tablet Take 1 tablet (10 mg total) by mouth daily.  Marland Kitchen EPINEPHrine (EPIPEN 2-PAK) 0.3 mg/0.3 mL IJ SOAJ injection Inject 0.3 mg into the muscle as needed. Inject 0.3mg  intramuscular route once as needed.  . hydrOXYzine (VISTARIL) 25 MG capsule Take 1 capsule (25 mg total) by mouth 2 (two) times daily as needed for itching.  . mometasone (ELOCON) 0.1 % cream Apply topically to the affected area twice daily.  . simvastatin (ZOCOR) 20 MG tablet TAKE 1 TABLET BY MOUTH EVERY DAY  . [DISCONTINUED] olmesartan (BENICAR) 20 MG tablet TAKE 1 TABLET BY MOUTH EVERY DAY   Facility-Administered Encounter Medications as of 03/11/2020  Medication  . ipratropium-albuterol (DUONEB) 0.5-2.5 (3) MG/3ML nebulizer solution 3  mL    Review of Systems  Constitutional: Negative for appetite change, chills, fatigue and fever.  Eyes: Negative for discharge, redness, itching and visual disturbance.  Respiratory: Negative for cough, chest tightness, shortness of breath and wheezing.   Cardiovascular: Negative for chest pain, palpitations and leg swelling.  Gastrointestinal: Negative for abdominal distention, abdominal pain,  constipation, diarrhea, nausea and vomiting.  Genitourinary: Negative for decreased urine volume, difficulty urinating, dysuria, flank pain, frequency and urgency.  Musculoskeletal: Negative for joint swelling and myalgias.  Skin: Negative for color change, pallor and rash.  Neurological: Negative for dizziness, speech difficulty, weakness, light-headedness and headaches.  Psychiatric/Behavioral: Negative for agitation, behavioral problems and sleep disturbance. The patient is not nervous/anxious.     Immunization History  Administered Date(s) Administered  . Fluad Quad(high Dose 65+) 10/11/2018  . Influenza, High Dose Seasonal PF 12/03/2015, 11/07/2019  . Influenza,inj,Quad PF,6+ Mos 10/11/2018  . Influenza-Unspecified 11/16/2017  . PFIZER(Purple Top)SARS-COV-2 Vaccination 02/18/2019, 03/11/2019  . Pneumococcal Conjugate-13 10/11/2018  . Tdap 10/11/2018   Pertinent  Health Maintenance Due  Topic Date Due  . INFLUENZA VACCINE  Completed  . DEXA SCAN  Completed  . PNA vac Low Risk Adult  Completed   Fall Risk  03/11/2020 02/22/2020 09/12/2019 12/19/2018 11/14/2018  Falls in the past year? 0 0 0 0 0  Number falls in past yr: 0 0 0 - -  Injury with Fall? 0 0 0 - -  Risk for fall due to : - - - - -  Follow up - - - - -   Functional Status Survey:    Vitals:   03/11/20 0925  BP: (!) 150/98  Pulse: 75  Resp: 16  Temp: (!) 97.1 F (36.2 C)  SpO2: 96%  Weight: 185 lb 3.7 oz (84 kg)  Height: 5' 1.8" (1.57 m)   Body mass index is 34.1 kg/m. Physical Exam Vitals reviewed.  Constitutional:      General: She is not in acute distress.    Appearance: She is obese. She is not ill-appearing.  HENT:     Head: Normocephalic.     Mouth/Throat:     Mouth: Mucous membranes are moist.     Pharynx: Oropharynx is clear. No oropharyngeal exudate or posterior oropharyngeal erythema.  Eyes:     General: No scleral icterus.       Right eye: No discharge.        Left eye: No discharge.      Extraocular Movements: Extraocular movements intact.     Conjunctiva/sclera: Conjunctivae normal.     Pupils: Pupils are equal, round, and reactive to light.  Neck:     Vascular: No carotid bruit.  Cardiovascular:     Rate and Rhythm: Normal rate and regular rhythm.     Pulses: Normal pulses.     Heart sounds: No murmur heard. No friction rub. No gallop.   Pulmonary:     Effort: Pulmonary effort is normal. No respiratory distress.     Breath sounds: Normal breath sounds. No wheezing, rhonchi or rales.  Chest:     Chest wall: No tenderness.  Abdominal:     General: Bowel sounds are normal. There is no distension.     Palpations: Abdomen is soft. There is no mass.     Tenderness: There is no abdominal tenderness. There is no right CVA tenderness, left CVA tenderness, guarding or rebound.  Musculoskeletal:        General: No swelling or tenderness. Normal range of motion.  Cervical back: Normal range of motion. No rigidity or tenderness.     Right lower leg: No edema.     Left lower leg: No edema.  Lymphadenopathy:     Cervical: No cervical adenopathy.  Skin:    General: Skin is warm and dry.     Coloration: Skin is not pale.     Findings: No bruising, erythema or rash.  Neurological:     Mental Status: She is alert and oriented to person, place, and time.     Cranial Nerves: No cranial nerve deficit.     Sensory: No sensory deficit.     Motor: No weakness.     Gait: Gait normal.  Psychiatric:        Mood and Affect: Mood normal.        Behavior: Behavior normal.        Thought Content: Thought content normal.        Judgment: Judgment normal.     Labs reviewed: Recent Labs    09/29/19 0926  NA 139  K 4.2  CL 105  CO2 26  GLUCOSE 108*  BUN 19  CREATININE 1.05*  CALCIUM 8.8   Recent Labs    09/29/19 0926  AST 14  ALT 9  BILITOT 0.3  PROT 7.0   Recent Labs    09/29/19 0926  WBC 5.9  NEUTROABS 3,882  HGB 12.8  HCT 39.4  MCV 90.8  PLT 257   Lab  Results  Component Value Date   TSH 1.21 09/29/2019   Lab Results  Component Value Date   HGBA1C 5.9 (H) 09/29/2019   Lab Results  Component Value Date   CHOL 164 09/29/2019   HDL 55 09/29/2019   LDLCALC 90 09/29/2019   TRIG 91 09/29/2019   CHOLHDL 3.0 09/29/2019    Significant Diagnostic Results in last 30 days:  No results found.  Assessment/Plan 1. Cough Request refill for albuterol - albuterol (VENTOLIN HFA) 108 (90 Base) MCG/ACT inhaler; Inhale 2 puffs into the lungs every 4 (four) hours as needed for wheezing or shortness of breath. Please educate how to use  Dispense: 18 g; Refill: 3  2. Non-seasonal allergic rhinitis due to pollen Stable on Nasal spray and Inhaler  - albuterol (VENTOLIN HFA) 108 (90 Base) MCG/ACT inhaler; Inhale 2 puffs into the lungs every 4 (four) hours as needed for wheezing or shortness of breath. Please educate how to use  Dispense: 18 g; Refill: 3 - ipratropium (ATROVENT) 0.03 % nasal spray; Place 2 sprays into both nostrils 2 (two) times daily.  Dispense: 30 mL; Refill: 3  3. Hypertension, unspecified type B/p readings has improved but not at goal. - Will increase Olmesartan from 20 mg tablet to 40 mg tablet daily. Continue on atenolol 50 mg tablet daily  On ASA and Statin  - Check Blood pressure daily and notify provider if > 140/90   Family/ staff Communication: Reviewed plan of care with patient verbalized understanding.  Labs/tests ordered: None   Next Appointment: As needed if symptoms worsen or fail to improve   Caesar Bookman, NP

## 2020-03-12 ENCOUNTER — Telehealth: Payer: Self-pay | Admitting: *Deleted

## 2020-03-12 DIAGNOSIS — I10 Essential (primary) hypertension: Secondary | ICD-10-CM

## 2020-03-12 NOTE — Telephone Encounter (Signed)
Patient daughter called and stated that patient use to take Losartan Potassium (Green Pill). Stated that they could not remember it at the appointment yesterday.   They are not sure of the mg and doesn't have the bottle. Patient has already threw it away. Daughter is going to call the previous PCP office and see if they have it on file and call us back.   Awaiting callback.

## 2020-03-13 MED ORDER — OLMESARTAN MEDOXOMIL 40 MG PO TABS: 40.0000 mg | ORAL_TABLET | Freq: Every day | ORAL | 1 refills | Status: DC

## 2020-03-13 NOTE — Telephone Encounter (Signed)
Ashley Johns, daughter, notified and agreed.  Stated that they will take Two of the Olmesartan 20mg  will call before they run out to get new Rx for new dosage.  Medication list updated.

## 2020-03-13 NOTE — Telephone Encounter (Signed)
Patient daughter, Ashley Johns, called back and stated that she cannot find the mg of Losartan patient took in the past but wonders if you would add this to her medications.  Stated that patient's blood pressure is still running high (Did not have the BP numbers).  Stated that when patient did take the Losartan it helped the blood pressure.  Please Advise.

## 2020-03-13 NOTE — Telephone Encounter (Signed)
Daughter only has the last BP Readings which are 151/74, 159/86 and 187/84.  Wants to know if you still want her to increase the medication.

## 2020-03-13 NOTE — Telephone Encounter (Signed)
What were the previous three days blood pressure readings?  She is already on Olmesartan which is same category with Losartan.  May increase Olmesartan from 20 mg tablet to 40 mg tablet daily if the three previous days Blood pressure have been > 140/90.

## 2020-03-13 NOTE — Telephone Encounter (Signed)
Patient daughter, Cordelia Pen, called and stated that she took patient's blood pressure and it was 187/84, well after taking medications.  Requesting for Losartan to be added to medications.  Please Advise.

## 2020-03-13 NOTE — Telephone Encounter (Signed)
Increase Olmesartan to 40 mg tablet one by mouth daily. - continue to monitor B/p daily

## 2020-03-19 ENCOUNTER — Ambulatory Visit: Payer: Medicare Other | Admitting: Family

## 2020-03-29 ENCOUNTER — Ambulatory Visit: Payer: Medicare Other | Admitting: Family

## 2020-03-31 ENCOUNTER — Other Ambulatory Visit: Payer: Self-pay | Admitting: Nurse Practitioner

## 2020-04-08 ENCOUNTER — Other Ambulatory Visit: Payer: Self-pay | Admitting: Nurse Practitioner

## 2020-04-11 ENCOUNTER — Other Ambulatory Visit: Payer: Self-pay

## 2020-04-11 ENCOUNTER — Encounter: Payer: Self-pay | Admitting: Internal Medicine

## 2020-04-11 ENCOUNTER — Ambulatory Visit (INDEPENDENT_AMBULATORY_CARE_PROVIDER_SITE_OTHER): Payer: Medicare Other | Admitting: Internal Medicine

## 2020-04-11 VITALS — BP 152/78 | HR 69 | Temp 96.9°F | Ht 62.0 in | Wt 182.0 lb

## 2020-04-11 DIAGNOSIS — E0500 Thyrotoxicosis with diffuse goiter without thyrotoxic crisis or storm: Secondary | ICD-10-CM

## 2020-04-11 DIAGNOSIS — H903 Sensorineural hearing loss, bilateral: Secondary | ICD-10-CM

## 2020-04-11 DIAGNOSIS — J301 Allergic rhinitis due to pollen: Secondary | ICD-10-CM

## 2020-04-11 DIAGNOSIS — Z23 Encounter for immunization: Secondary | ICD-10-CM

## 2020-04-11 DIAGNOSIS — E785 Hyperlipidemia, unspecified: Secondary | ICD-10-CM

## 2020-04-11 DIAGNOSIS — R7303 Prediabetes: Secondary | ICD-10-CM

## 2020-04-11 DIAGNOSIS — R059 Cough, unspecified: Secondary | ICD-10-CM

## 2020-04-11 DIAGNOSIS — I10 Essential (primary) hypertension: Secondary | ICD-10-CM

## 2020-04-11 MED ORDER — SIMVASTATIN 20 MG PO TABS: 20.0000 mg | ORAL_TABLET | Freq: Every day | ORAL | 1 refills | Status: DC

## 2020-04-11 MED ORDER — CETIRIZINE HCL 10 MG PO TABS: 10.0000 mg | ORAL_TABLET | Freq: Every day | ORAL | 1 refills | Status: DC

## 2020-04-11 NOTE — Progress Notes (Signed)
Location:  [facility] clinic Provider:  Janique Hoefer L. Renato Gails, D.O., C.M.D.  Goals of Care:  Advanced Directives 04/11/2020  Does Patient Have a Medical Advance Directive? Yes  Type of Advance Directive -  Does patient want to make changes to medical advance directive? Yes (MAU/Ambulatory/Procedural Areas - Information given)  Copy of Healthcare Power of Attorney in Chart? -  Would patient like information on creating a medical advance directive? -   Chief Complaint  Patient presents with  . Medication Management    Patient would like to get aquatinted with Dr.Anatole Apollo and discuss chronic medical conditions. Refill request for 90 day supply of zyrtec and zocor     HPI: Patient is a 85 y.o. female seen today for medical management of chronic diseases.    Her daughter is trying to get her to exercise some--is active shopping, cleaning her house, hanging curtains, etc.  BUT does not do deliberate exercise.  Loves water aerobics.  Pt says if she gets started, she might keep going.  She does well having a partner to go with her.  She cooks for her family and does add salt.  She is thinking about leaving off the salt and then let family put in on there.  She loves to cook.  They have a machine to check at home.  They have not been using it.  Their granddaughter has been there.  There was a spell of pork skins eating that led to 180 bp.    She is using zyrtec for dry cough since visit in end of jan for allergies.    Has hearing aids but a piece of it came off and needs it fixed.    She has had zostavax in 2016 at [facility].    Past Medical History:  Diagnosis Date  . Eczema    Per [facility] new patient packet  . Graves disease    Per [facility] new patient packet  . High blood pressure    Per [facility] new patient packet  . Hypertension     Past Surgical History:  Procedure Laterality Date  . TOTAL ABDOMINAL HYSTERECTOMY Bilateral 1970's  . TUBAL LIGATION      Allergies  Allergen Reactions  . Codeine Nausea Only     unknown  . Tramadol Nausea Only    Outpatient Encounter Medications as of 04/11/2020  Medication Sig  . acetaminophen (TYLENOL) 650 MG CR tablet Take 650 mg by mouth every 8 (eight) hours as needed for pain.  Marland Kitchen albuterol (VENTOLIN HFA) 108 (90 Base) MCG/ACT inhaler Inhale 2 puffs into the lungs every 4 (four) hours as needed for wheezing or shortness of breath. Please educate how to use  . aspirin EC 81 MG tablet Take 81 mg by mouth daily. Swallow whole.  Marland Kitchen atenolol (TENORMIN) 50 MG tablet Take 1 tablet (50 mg total) by mouth daily.  Marland Kitchen augmented betamethasone dipropionate (DIPROLENE-AF) 0.05 % ointment Apply 1 application topically daily.  Marland Kitchen EPINEPHrine (EPIPEN 2-PAK) 0.3 mg/0.3 mL IJ SOAJ injection Inject 0.3 mg into the muscle as needed. Inject 0.3mg  intramuscular route once as needed.  . hydrOXYzine (VISTARIL) 25 MG capsule Take 1 capsule (25 mg total) by mouth 2 (two) times daily as needed for itching.  Marland Kitchen ipratropium (ATROVENT) 0.03 % nasal spray Place 2 sprays into both nostrils 2 (two) times daily.  . mometasone (ELOCON) 0.1 % cream Apply topically to the affected area twice daily.  Marland Kitchen olmesartan (BENICAR) 40 MG tablet Take 1 tablet (40 mg total) by  mouth daily.  . simvastatin (ZOCOR) 20 MG tablet TAKE 1 TABLET BY MOUTH EVERY DAY  . cetirizine (ZYRTEC) 10 MG tablet Take 1 tablet (10 mg total) by mouth daily. (Patient not taking: Reported on 04/11/2020)  . [DISCONTINUED] ipratropium-albuterol (DUONEB) 0.5-2.5 (3) MG/3ML nebulizer solution 3 mL    No facility-administered encounter medications on file as of 04/11/2020.    Review of Systems:  Review of Systems  Constitutional: Negative for chills, fever and malaise/fatigue.  HENT: Positive for hearing loss.   Respiratory: Positive for cough. Negative for shortness of breath.   Cardiovascular: Negative for chest pain and palpitations.  Gastrointestinal: Negative for abdominal pain.  Genitourinary: Negative for dysuria.  Musculoskeletal:  Negative for falls.  Skin: Negative for itching and rash.  Neurological: Negative for dizziness and loss of consciousness.  Psychiatric/Behavioral: Negative for depression.    Health Maintenance  Topic Date Due  . TETANUS/TDAP  10/10/2028  . INFLUENZA VACCINE  Completed  . DEXA SCAN  Completed  . COVID-19 Vaccine  Completed  . PNA vac Low Risk Adult  Completed  . HPV VACCINES  Aged Out    Physical Exam: Vitals:   04/11/20 1327  BP: (!) 152/78  Pulse: 69  Temp: (!) 96.9 F (36.1 C)  TempSrc: Temporal  SpO2: 95%  Weight: 182 lb (82.6 kg)  Height: 5\' 2"  (1.575 m)   Body mass index is 33.29 kg/m. Physical Exam Constitutional:      General: She is not in acute distress.    Appearance: Normal appearance. She is not toxic-appearing.  Cardiovascular:     Rate and Rhythm: Normal rate and regular rhythm.     Pulses: Normal pulses.     Heart sounds: Normal heart sounds.  Pulmonary:     Effort: Pulmonary effort is normal.     Breath sounds: Normal breath sounds. No wheezing, rhonchi or rales.  Abdominal:     General: Bowel sounds are normal.  Musculoskeletal:        General: Normal range of motion.  Skin:    General: Skin is warm and dry.  Neurological:     General: No focal deficit present.     Mental Status: She is alert and oriented to person, place, and time.  Psychiatric:        Mood and Affect: Mood normal.        Behavior: Behavior normal.     Labs reviewed: Basic Metabolic Panel: Recent Labs    09/29/19 0926  NA 139  K 4.2  CL 105  CO2 26  GLUCOSE 108*  BUN 19  CREATININE 1.05*  CALCIUM 8.8  TSH 1.21   Liver Function Tests: Recent Labs    09/29/19 0926  AST 14  ALT 9  BILITOT 0.3  PROT 7.0   No results for input(s): LIPASE, AMYLASE in the last 8760 hours. No results for input(s): AMMONIA in the last 8760 hours. CBC: Recent Labs    09/29/19 0926  WBC 5.9  NEUTROABS 3,882  HGB 12.8  HCT 39.4  MCV 90.8  PLT 257   Lipid  Panel: Recent Labs    09/29/19 0926  CHOL 164  HDL 55  LDLCALC 90  TRIG 91  CHOLHDL 3.0   Lab Results  Component Value Date   HGBA1C 5.9 (H) 09/29/2019    Procedures since last visit: No results found.  Assessment/Plan 1. Cough - discussed use of antihistamines short-term and flonase/nasonex - cetirizine (ZYRTEC) 10 MG tablet; Take 1 tablet (10 mg total)  by mouth daily.  Dispense: 90 tablet; Refill: 1  2. Non-seasonal allergic rhinitis due to pollen - renew cetirizine (ZYRTEC) 10 MG tablet; Take 1 tablet (10 mg total) by mouth daily.  Dispense: 90 tablet; Refill: 1  3. 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  Dispense: 90 tablet; Refill: 1 - Lipid panel; Future  4. Essential hypertension -encouraged her to stop cooking with salt and avoid high sodium snacks and foods that are contributing to her bp -monitor at home and bring readings  - COMPLETe METABOLIC PANEL WITH GFR; Future - CBC with Differential/Platelet; Future  5. Prediabetes -recommended return to water aerobics and lower fat and carb diet  Lab Results  Component Value Date   HGBA1C 5.9 (H) 09/29/2019   - Hemoglobin A1c; Future  6. Graves disease -f/u labs, not on levothyroxine - TSH; Future  7. Sensorineural hearing loss (SNHL) of both ears -getting hearing aid fixed, crucial for communication  8. Need for vaccination against Streptococcus pneumoniae -they wound up declining for fear she may have had before and they were to be calling medicare to see if previously billed and if not, should receive pneumovax next time   Labs/tests ordered:   Lab Orders     Lipid panel     Hemoglobin A1c     COMPLETE METABOLIC PANEL WITH GFR     CBC with Differential/Platelet     TSH Next appt:  4 mos CPE with [name], fasting  labs before    Brittinee Risk L. Dominique Calvey, D.O. Geriatrics [facility] [address], [place] [address] Cell Phone (Mon-Fri 8am-5pm):  [phone] On Call:  [phone] & follow prompts after 5pm & weekends Office Phone:  [phone] Office Fax:  [phone]

## 2020-04-11 NOTE — Patient Instructions (Addendum)
Please get your shingrix series at the pharmacy.   Avoid adding salt to your food. Please check your blood pressure weekly at home and write them down.    Please call the breast center for your bone density test.

## 2020-05-31 DIAGNOSIS — I70223 Atherosclerosis of native arteries of extremities with rest pain, bilateral legs: Secondary | ICD-10-CM | POA: Diagnosis not present

## 2020-06-04 ENCOUNTER — Other Ambulatory Visit: Payer: Self-pay | Admitting: Family

## 2020-06-04 DIAGNOSIS — Z780 Asymptomatic menopausal state: Secondary | ICD-10-CM

## 2020-06-29 ENCOUNTER — Other Ambulatory Visit: Payer: Self-pay | Admitting: Nurse Practitioner

## 2020-06-29 ENCOUNTER — Other Ambulatory Visit: Payer: Self-pay | Admitting: Family

## 2020-06-29 DIAGNOSIS — I10 Essential (primary) hypertension: Secondary | ICD-10-CM

## 2020-07-29 ENCOUNTER — Other Ambulatory Visit: Payer: Self-pay

## 2020-07-29 ENCOUNTER — Encounter: Payer: Self-pay | Admitting: Family

## 2020-07-29 ENCOUNTER — Ambulatory Visit (INDEPENDENT_AMBULATORY_CARE_PROVIDER_SITE_OTHER): Payer: Medicare Other | Admitting: Family

## 2020-07-29 VITALS — BP 160/98 | HR 81 | Temp 97.1°F | Resp 18 | Ht 62.0 in | Wt 180.0 lb

## 2020-07-29 DIAGNOSIS — E0500 Thyrotoxicosis with diffuse goiter without thyrotoxic crisis or storm: Secondary | ICD-10-CM

## 2020-07-29 DIAGNOSIS — R42 Dizziness and giddiness: Secondary | ICD-10-CM

## 2020-07-29 NOTE — Progress Notes (Signed)
This encounter was created in error - please disregard.

## 2020-07-29 NOTE — Progress Notes (Signed)
Provider: Richarda Blade FNP-C  Alysabeth Scalia, Donalee Citrin, NP  Patient Care Team: Demarques Pilz, Donalee Citrin, NP as PCP - General (Family Medicine) Aris Lot, MD as Consulting Physician (Dermatology)  Extended Emergency Contact Information Primary Emergency Contact: Terance Hart [place] of [place] Phone: [phone] Relation: Daughter Secondary Emergency Contact: Robbie Lis [place] of [place] Mobile Phone: [phone] Relation: Daughter  Code Status:  Full Code  Goals of care: Advanced Directive information Advanced Directives 07/29/2020  Does Patient Have a Medical Advance Directive? No  Type of Advance Directive -  Does patient want to make changes to medical advance directive? -  Copy of Healthcare Power of Attorney in Chart? -  Would patient like information on creating a medical advance directive? No - Patient declined     Chief Complaint  Patient presents with   Acute Visit    Having dizzy spells since last week.    HPI:  Pt is a 85 y.o. female seen today for an acute visit for evaluation of dizzy spells x 1 week.she is here with her daughter who provides additional HPI information. States was mobbing the house when she had a dizzy spell. Daughter concern that sometimes complains of palpitation and feeling hot and sweat.concerned since she was dx with graves disease but was never treated for it.  She denies any chest pain,feeling depressed or constipation or weight gain. Has had 2 lbs weight loss over 3 months.    Past Medical History:  Diagnosis Date   Eczema    Per [facility] new patient packet   Graves disease    Per [facility] new patient packet   High blood pressure    Per [facility] new patient packet   Hypertension    Past Surgical History:  Procedure Laterality Date   TOTAL ABDOMINAL HYSTERECTOMY Bilateral 1970's   TUBAL LIGATION      Allergies  Allergen Reactions   Codeine Nausea Only    unknown   Tramadol Nausea Only    Outpatient  Encounter Medications as of 07/29/2020  Medication Sig   acetaminophen (TYLENOL) 650 MG CR tablet Take 650 mg by mouth every 8 (eight) hours as needed for pain.   albuterol (VENTOLIN HFA) 108 (90 Base) MCG/ACT inhaler Inhale 2 puffs into the lungs every 4 (four) hours as needed for wheezing or shortness of breath. Please educate how to use   aspirin EC 81 MG tablet Take 81 mg by mouth daily. Swallow whole.   atenolol (TENORMIN) 50 MG tablet TAKE 1 TABLET(50 MG) BY MOUTH DAILY   augmented betamethasone dipropionate (DIPROLENE-AF) 0.05 % ointment Apply 1 application topically daily.   cetirizine (ZYRTEC) 10 MG tablet Take 1 tablet (10 mg total) by mouth daily.   EPINEPHrine (EPIPEN 2-PAK) 0.3 mg/0.3 mL IJ SOAJ injection Inject 0.3 mg into the muscle as needed. Inject 0.3mg  intramuscular route once as needed.   hydrOXYzine (VISTARIL) 25 MG capsule Take 1 capsule (25 mg total) by mouth 2 (two) times daily as needed for itching.   ipratropium (ATROVENT) 0.03 % nasal spray Place 2 sprays into both nostrils 2 (two) times daily.   mometasone (ELOCON) 0.1 % cream Apply topically to the affected area twice daily.   olmesartan (BENICAR) 40 MG tablet Take 1 tablet (40 mg total) by mouth daily.   simvastatin (ZOCOR) 20 MG tablet Take 1 tablet (20 mg total) by mouth daily.   No facility-administered encounter medications on file as of 07/29/2020.    Review of Systems  Constitutional:  Negative  for appetite change, chills, fatigue, fever and unexpected weight change.  HENT:  Positive for hearing loss. Negative for congestion, dental problem, ear discharge, ear pain, facial swelling, nosebleeds, postnasal drip, rhinorrhea, sinus pressure, sinus pain, sneezing, sore throat, tinnitus and trouble swallowing.        Lost her left hearing aids at home while removing her mask  Eyes:  Negative for pain, discharge, redness, itching and visual disturbance.  Respiratory:  Negative for cough, chest tightness, shortness of  breath and wheezing.   Cardiovascular:  Negative for chest pain, palpitations and leg swelling.  Gastrointestinal:  Negative for abdominal distention, abdominal pain, blood in stool, constipation, diarrhea, nausea and vomiting.  Endocrine: Positive for heat intolerance. Negative for polydipsia, polyphagia and polyuria.  Genitourinary:  Negative for difficulty urinating, dysuria, frequency and urgency.  Musculoskeletal:  Negative for arthralgias, back pain, gait problem, joint swelling, myalgias, neck pain and neck stiffness.  Skin:  Negative for color change, pallor, rash and wound.  Neurological:  Negative for syncope, speech difficulty, weakness, light-headedness, numbness and headaches.       Dizzy spell  Hematological:  Does not bruise/bleed easily.  Psychiatric/Behavioral:  Negative for agitation, behavioral problems, confusion, hallucinations, self-injury, sleep disturbance and suicidal ideas. The patient is not nervous/anxious.    Immunization History  Administered Date(s) Administered   Fluad Quad(high Dose 65+) 10/11/2018   Influenza, High Dose Seasonal PF 12/03/2015, 11/07/2019   Influenza,inj,Quad PF,6+ Mos 10/11/2018   Influenza-Unspecified 11/16/2017   PFIZER(Purple Top)SARS-COV-2 Vaccination 02/18/2019, 03/11/2019, 11/29/2019   Pneumococcal Conjugate-13 10/11/2018   Tdap 10/11/2018   Pertinent  Health Maintenance Due  Topic Date Due   INFLUENZA VACCINE  09/09/2020   DEXA SCAN  Completed   PNA vac Low Risk Adult  Completed   Fall Risk  07/29/2020 04/11/2020 03/11/2020 02/22/2020 09/12/2019  Falls in the past year? 0 1 0 0 0  Number falls in past yr: 0 0 0 0 0  Injury with Fall? 0 0 0 0 0  Risk for fall due to : No Fall Risks - - - -  Follow up - - - - -   Functional Status Survey:    Vitals:   07/29/20 1535 07/29/20 1628  BP: (!) 180/100 (!) 160/98  Pulse: 81   Resp: 18   Temp: (!) 97.1 F (36.2 C)   SpO2: 92%   Weight: 180 lb (81.6 kg)   Height: 5\' 2"  (1.575 m)     Body mass index is 32.92 kg/m. Physical Exam Vitals reviewed.  Constitutional:      General: She is not in acute distress.    Appearance: Normal appearance. She is obese. She is not ill-appearing or diaphoretic.  HENT:     Head: Normocephalic.     Right Ear: Tympanic membrane, ear canal and external ear normal. There is no impacted cerumen.     Left Ear: Tympanic membrane, ear canal and external ear normal. There is no impacted cerumen.     Nose: Nose normal. No congestion or rhinorrhea.     Mouth/Throat:     Mouth: Mucous membranes are moist.     Pharynx: Oropharynx is clear. No oropharyngeal exudate or posterior oropharyngeal erythema.  Eyes:     General: No scleral icterus.       Right eye: No discharge.        Left eye: No discharge.     Extraocular Movements: Extraocular movements intact.     Conjunctiva/sclera: Conjunctivae normal.     Pupils: Pupils  are equal, round, and reactive to light.  Neck:     Vascular: No carotid bruit.  Cardiovascular:     Rate and Rhythm: Normal rate and regular rhythm.     Pulses: Normal pulses.     Heart sounds: Normal heart sounds. No murmur heard.   No friction rub. No gallop.  Pulmonary:     Effort: Pulmonary effort is normal. No respiratory distress.     Breath sounds: Normal breath sounds. No wheezing, rhonchi or rales.  Chest:     Chest wall: No tenderness.  Abdominal:     General: Bowel sounds are normal. There is no distension.     Palpations: Abdomen is soft. There is no mass.     Tenderness: There is no abdominal tenderness. There is no right CVA tenderness, left CVA tenderness, guarding or rebound.  Musculoskeletal:        General: No swelling or tenderness. Normal range of motion.     Cervical back: Normal range of motion. No rigidity or tenderness.     Right lower leg: No edema.     Left lower leg: No edema.  Lymphadenopathy:     Cervical: No cervical adenopathy.  Skin:    General: Skin is warm and dry.      Coloration: Skin is not pale.     Findings: No bruising, erythema, lesion or rash.  Neurological:     Mental Status: She is alert and oriented to person, place, and time.     Cranial Nerves: No cranial nerve deficit.     Sensory: No sensory deficit.     Motor: No weakness.     Coordination: Coordination normal.     Gait: Gait normal.  Psychiatric:        Mood and Affect: Mood normal.        Speech: Speech normal.        Behavior: Behavior normal.        Thought Content: Thought content normal.        Judgment: Judgment normal.    Labs reviewed: Recent Labs    09/29/19 0926  NA 139  K 4.2  CL 105  CO2 26  GLUCOSE 108*  BUN 19  CREATININE 1.05*  CALCIUM 8.8   Recent Labs    09/29/19 0926  AST 14  ALT 9  BILITOT 0.3  PROT 7.0   Recent Labs    09/29/19 0926 07/29/20 1621  WBC 5.9 5.8  NEUTROABS 3,882 3,880  HGB 12.8 13.3  HCT 39.4 40.8  MCV 90.8 89.5  PLT 257 270   Lab Results  Component Value Date   TSH 1.21 09/29/2019   Lab Results  Component Value Date   HGBA1C 5.9 (H) 09/29/2019   Lab Results  Component Value Date   CHOL 164 09/29/2019   HDL 55 09/29/2019   LDLCALC 90 09/29/2019   TRIG 91 09/29/2019   CHOLHDL 3.0 09/29/2019    Significant Diagnostic Results in last 30 days:  No results found.  Assessment/Plan  1. Dizziness Intermittent unclear etiology but suspect  due to high blood pressure.Encouraged to take her B/p medication as directed. - encouraged to increase water intake to 6-8 glasses daily  - will check H/H  - CBC with Differential/Platelet  2. Graves disease Lab Results  Component Value Date   TSH 1.21 09/29/2019  No previous treatment per daughter. - TSH  Family/ staff Communication: Reviewed plan of care with patient and daughter verbalized understanding.  Labs/tests ordered:  -  CBC with Differential/Platelet - TSH  Next Appointment: As needed if symptoms worsen or fail to improve    Caesar Bookmaninah C Ted Leonhart, NP

## 2020-07-30 LAB — CBC WITH DIFFERENTIAL/PLATELET
Absolute Monocytes: 412 cells/uL (ref 200–950)
Basophils Absolute: 41 cells/uL (ref 0–200)
Basophils Relative: 0.7 %
Eosinophils Absolute: 197 cells/uL (ref 15–500)
Eosinophils Relative: 3.4 %
HCT: 40.8 % (ref 35.0–45.0)
Hemoglobin: 13.3 g/dL (ref 11.7–15.5)
Lymphs Abs: 1270 cells/uL (ref 850–3900)
MCH: 29.2 pg (ref 27.0–33.0)
MCHC: 32.6 g/dL (ref 32.0–36.0)
MCV: 89.5 fL (ref 80.0–100.0)
MPV: 9.6 fL (ref 7.5–12.5)
Monocytes Relative: 7.1 %
Neutro Abs: 3880 cells/uL (ref 1500–7800)
Neutrophils Relative %: 66.9 %
Platelets: 270 10*3/uL (ref 140–400)
RBC: 4.56 10*6/uL (ref 3.80–5.10)
RDW: 13.1 % (ref 11.0–15.0)
Total Lymphocyte: 21.9 %
WBC: 5.8 10*3/uL (ref 3.8–10.8)

## 2020-07-30 LAB — TSH: TSH: 0.7 mIU/L (ref 0.40–4.50)

## 2020-07-31 ENCOUNTER — Other Ambulatory Visit (HOSPITAL_BASED_OUTPATIENT_CLINIC_OR_DEPARTMENT_OTHER): Payer: Self-pay

## 2020-07-31 ENCOUNTER — Ambulatory Visit: Payer: Medicare Other | Attending: Internal Medicine

## 2020-07-31 DIAGNOSIS — Z23 Encounter for immunization: Secondary | ICD-10-CM

## 2020-07-31 MED ORDER — PFIZER-BIONT COVID-19 VAC-TRIS 30 MCG/0.3ML IM SUSP
INTRAMUSCULAR | 0 refills | Status: DC
  Filled 2020-07-31: qty 0.3, 1d supply, fill #0

## 2020-07-31 NOTE — Progress Notes (Signed)
   Covid-[facility] Vaccination [facility]  Name:  Ashley Johns    MRN: [id number] DOB: 04-Apr-1935  07/31/2020  Ashley Johns was observed post Covid-19 immunization for 15 minutes without incident. She was provided with Vaccine Information Sheet and instruction to access the V-Safe system.   Ashley Johns was instructed to call 911 with any severe reactions post vaccine: Difficulty breathing  Swelling of face and throat  A fast heartbeat  A bad rash all over body  Dizziness and weakness   Immunizations Administered     Name Date Dose VIS Date Route   PFIZER Comrnaty(Gray TOP) Covid-19 Vaccine 07/31/2020 10:49 AM 0.3 mL 01/18/2020 Intramuscular   Manufacturer: [employer], [employer]   Lot: [id number]   NDC: [phone]

## 2020-08-07 ENCOUNTER — Encounter: Payer: Self-pay | Admitting: Family

## 2020-08-07 ENCOUNTER — Ambulatory Visit (INDEPENDENT_AMBULATORY_CARE_PROVIDER_SITE_OTHER): Payer: Medicare Other | Admitting: Family

## 2020-08-07 ENCOUNTER — Other Ambulatory Visit: Payer: Self-pay

## 2020-08-07 DIAGNOSIS — J301 Allergic rhinitis due to pollen: Secondary | ICD-10-CM | POA: Diagnosis not present

## 2020-08-07 DIAGNOSIS — R059 Cough, unspecified: Secondary | ICD-10-CM

## 2020-08-07 MED ORDER — VITAMIN C 500 MG PO TABS: 250.0000 mg | ORAL_TABLET | Freq: Two times a day (BID) | ORAL | 0 refills | Status: AC

## 2020-08-07 MED ORDER — VITAMIN D3 50 MCG (2000 UT) PO CAPS: 2000.0000 [IU] | ORAL_CAPSULE | Freq: Every day | ORAL | 0 refills | Status: AC

## 2020-08-07 MED ORDER — ZINC GLUCONATE 50 MG PO TABS: 50.0000 mg | ORAL_TABLET | Freq: Every day | ORAL | 0 refills | Status: AC

## 2020-08-07 MED ORDER — ALBUTEROL SULFATE HFA 108 (90 BASE) MCG/ACT IN AERS: 2.0000 | INHALATION_SPRAY | RESPIRATORY_TRACT | 3 refills | Status: DC | PRN

## 2020-08-07 MED ORDER — DOXYCYCLINE HYCLATE 100 MG PO TABS: 100.0000 mg | ORAL_TABLET | Freq: Two times a day (BID) | ORAL | 0 refills | Status: AC

## 2020-08-07 MED ORDER — GUAIFENESIN-DM 100-10 MG/5ML PO SYRP: 5.0000 mL | ORAL_SOLUTION | ORAL | 0 refills | Status: DC | PRN

## 2020-08-07 NOTE — Progress Notes (Signed)
Provider: Richarda Blade FNP-C  Jupiter Kabir, Donalee Citrin, NP  Patient Care Team: Ginni Eichler, Donalee Citrin, NP as PCP - General (Family Medicine) Aris Lot, MD as Consulting Physician (Dermatology)  Extended Emergency Contact Information Primary Emergency Contact: Terance Hart [place] of [place] Mobile Phone: [phone] Relation: Daughter Secondary Emergency Contact: booker,melonie Mobile Phone: [phone] Relation: Granddaughter  Code Status:  Full Code  Goals of care: Advanced Directive information Advanced Directives 08/07/2020  Does Patient Have a Medical Advance Directive? No  Type of Advance Directive -  Does patient want to make changes to medical advance directive? -  Copy of Healthcare Power of Attorney in Chart? -  Would patient like information on creating a medical advance directive? No - Patient declined     Chief Complaint  Patient presents with   Acute Visit    Complains of hoarseness/cough after covid vaccine 07/31/2020    HPI:  Pt is a 85 y.o. female seen today for an acute visit for evaluation of hoarseness,cough after receiving  4th COVID-19 on 07/31/2020.Has been coughing yellow thick mucus.States daughter and her Husband got the vaccine and also ended up with a short course of runny nose which as resolved. She denies any fever,chills. Also denies any headache,fatigue,chest tightness,palpitation,chest pain or shortness of breath. Appetite has been good.No loss of sense of smell or taste.       Past Medical History:  Diagnosis Date   Eczema    Per [facility] new patient packet   Graves disease    Per [facility] new patient packet   High blood pressure    Per [facility] new patient packet   Hypertension    Past Surgical History:  Procedure Laterality Date   TOTAL ABDOMINAL HYSTERECTOMY Bilateral 1970's   TUBAL LIGATION      Allergies  Allergen Reactions   Codeine Nausea Only    unknown   Tramadol Nausea Only    Outpatient Encounter Medications as of  08/07/2020  Medication Sig   acetaminophen (TYLENOL) 650 MG CR tablet Take 650 mg by mouth every 8 (eight) hours as needed for pain.   albuterol (VENTOLIN HFA) 108 (90 Base) MCG/ACT inhaler Inhale 2 puffs into the lungs every 4 (four) hours as needed for wheezing or shortness of breath. Please educate how to use   aspirin EC 81 MG tablet Take 81 mg by mouth daily. Swallow whole.   atenolol (TENORMIN) 50 MG tablet TAKE 1 TABLET(50 MG) BY MOUTH DAILY   augmented betamethasone dipropionate (DIPROLENE-AF) 0.05 % ointment Apply 1 application topically daily.   cetirizine (ZYRTEC) 10 MG tablet Take 1 tablet (10 mg total) by mouth daily.   COVID-19 mRNA Vac-TriS, Pfizer, (PFIZER-BIONT COVID-19 VAC-TRIS) SUSP injection Inject into the muscle.   EPINEPHrine (EPIPEN 2-PAK) 0.3 mg/0.3 mL IJ SOAJ injection Inject 0.3 mg into the muscle as needed. Inject 0.3mg  intramuscular route once as needed.   hydrOXYzine (VISTARIL) 25 MG capsule Take 1 capsule (25 mg total) by mouth 2 (two) times daily as needed for itching.   ipratropium (ATROVENT) 0.03 % nasal spray Place 2 sprays into both nostrils 2 (two) times daily.   mometasone (ELOCON) 0.1 % cream Apply topically to the affected area twice daily.   olmesartan (BENICAR) 40 MG tablet Take 1 tablet (40 mg total) by mouth daily.   simvastatin (ZOCOR) 20 MG tablet Take 1 tablet (20 mg total) by mouth daily.   No facility-administered encounter medications on file as of 08/07/2020.    Review of Systems  Constitutional:  Negative for appetite change, chills, fatigue, fever and unexpected weight change.  HENT:  Positive for hearing loss. Negative for congestion, dental problem, ear discharge, ear pain, facial swelling, nosebleeds, postnasal drip, rhinorrhea, sinus pressure, sinus pain, sneezing, sore throat, tinnitus and trouble swallowing.        Hoarseness  Eyes:  Negative for pain, discharge, redness, itching and visual disturbance.  Respiratory:  Positive for  cough. Negative for chest tightness, shortness of breath and wheezing.   Cardiovascular:  Negative for chest pain, palpitations and leg swelling.  Gastrointestinal:  Negative for abdominal distention, abdominal pain, blood in stool, constipation, diarrhea, nausea and vomiting.  Musculoskeletal:  Positive for gait problem. Negative for arthralgias, back pain, joint swelling, myalgias, neck pain and neck stiffness.  Skin:  Negative for color change, pallor, rash and wound.  Neurological:  Negative for dizziness, syncope, speech difficulty, weakness, light-headedness, numbness and headaches.  Hematological:  Does not bruise/bleed easily.  Psychiatric/Behavioral:  Negative for agitation, behavioral problems, confusion and hallucinations. The patient is not nervous/anxious.    Immunization History  Administered Date(s) Administered   Fluad Quad(high Dose 65+) 10/11/2018   Influenza, High Dose Seasonal PF 12/03/2015, 11/07/2019   Influenza,inj,Quad PF,6+ Mos 10/11/2018   Influenza-Unspecified 11/16/2017   PFIZER Comirnaty(Gray Top)Covid-19 Tri-Sucrose Vaccine 07/31/2020   PFIZER(Purple Top)SARS-COV-2 Vaccination 02/18/2019, 03/11/2019, 11/29/2019, 07/31/2020   Pneumococcal Conjugate-13 10/11/2018   Tdap 10/11/2018   Pertinent  Health Maintenance Due  Topic Date Due   INFLUENZA VACCINE  09/09/2020   DEXA SCAN  Completed   PNA vac Low Risk Adult  Completed   Fall Risk  08/07/2020 07/29/2020 04/11/2020 03/11/2020 02/22/2020  Falls in the past year? 0 0 1 0 0  Number falls in past yr: 0 0 0 0 0  Injury with Fall? 0 0 0 0 0  Risk for fall due to : No Fall Risks No Fall Risks - - -  Follow up Falls evaluation completed - - - -   Functional Status Survey:    Vitals:   08/07/20 1424  BP: (!) 170/98  Pulse: 81  Resp: 16  Temp: (!) 97.5 F (36.4 C)  SpO2: 95%  Weight: 177 lb 3.2 oz (80.4 kg)  Height: 5\' 2"  (1.575 m)   Body mass index is 32.41 kg/m. Physical Exam Vitals reviewed.   Constitutional:      General: She is not in acute distress.    Appearance: Normal appearance. She is normal weight. She is not ill-appearing or diaphoretic.  HENT:     Head: Normocephalic.     Right Ear: Tympanic membrane, ear canal and external ear normal. There is no impacted cerumen.     Left Ear: Tympanic membrane, ear canal and external ear normal. There is no impacted cerumen.     Nose: Nose normal. No congestion or rhinorrhea.     Mouth/Throat:     Mouth: Mucous membranes are moist.     Pharynx: Oropharynx is clear. No oropharyngeal exudate or posterior oropharyngeal erythema.  Eyes:     General: No scleral icterus.       Right eye: No discharge.        Left eye: No discharge.     Extraocular Movements: Extraocular movements intact.     Conjunctiva/sclera: Conjunctivae normal.     Pupils: Pupils are equal, round, and reactive to light.  Neck:     Vascular: No carotid bruit.  Cardiovascular:     Rate and Rhythm: Normal rate and regular rhythm.  Pulses: Normal pulses.     Heart sounds: Normal heart sounds. No murmur heard.   No friction rub. No gallop.  Pulmonary:     Effort: Pulmonary effort is normal. No respiratory distress.     Breath sounds: No wheezing or rhonchi.     Comments: Left lung rales clears up with cough  Chest:     Chest wall: No tenderness.  Abdominal:     General: Bowel sounds are normal. There is no distension.     Palpations: Abdomen is soft. There is no mass.     Tenderness: There is no abdominal tenderness. There is no right CVA tenderness, left CVA tenderness, guarding or rebound.  Musculoskeletal:        General: No swelling or tenderness. Normal range of motion.     Cervical back: Normal range of motion. No rigidity or tenderness.     Right lower leg: No edema.     Left lower leg: No edema.  Lymphadenopathy:     Cervical: No cervical adenopathy.  Skin:    General: Skin is warm and dry.     Coloration: Skin is not pale.     Findings: No  bruising, erythema, lesion or rash.  Neurological:     Mental Status: She is alert and oriented to person, place, and time.     Cranial Nerves: No cranial nerve deficit.     Sensory: No sensory deficit.     Motor: No weakness.     Coordination: Coordination normal.     Gait: Gait normal.  Psychiatric:        Mood and Affect: Mood normal.        Speech: Speech normal.        Behavior: Behavior normal.        Thought Content: Thought content normal.        Judgment: Judgment normal.    Labs reviewed: Recent Labs    09/29/19 0926  NA 139  K 4.2  CL 105  CO2 26  GLUCOSE 108*  BUN 19  CREATININE 1.05*  CALCIUM 8.8   Recent Labs    09/29/19 0926  AST 14  ALT 9  BILITOT 0.3  PROT 7.0   Recent Labs    09/29/19 0926 07/29/20 1621  WBC 5.9 5.8  NEUTROABS 3,882 3,880  HGB 12.8 13.3  HCT 39.4 40.8  MCV 90.8 89.5  PLT 257 270   Lab Results  Component Value Date   TSH 0.70 07/29/2020   Lab Results  Component Value Date   HGBA1C 5.9 (H) 09/29/2019   Lab Results  Component Value Date   CHOL 164 09/29/2019   HDL 55 09/29/2019   LDLCALC 90 09/29/2019   TRIG 91 09/29/2019   CHOLHDL 3.0 09/29/2019    Significant Diagnostic Results in last 30 days:  No results found.  Assessment/Plan 1. Cough Afebrile.cough developed after receiving her COVID-19 booster vaccine. Will rule out for COVID-9 no recent exposure to sick person with COVID-19 though daughter and son also had similar runny nose after COVID-19 vaccine. Will start on supportive care as below  - encouraged to self quarantine for  5 days  - albuterol (VENTOLIN HFA) 108 (90 Base) MCG/ACT inhaler; Inhale 2 puffs into the lungs every 4 (four) hours as needed for wheezing or shortness of breath. Please educate how to use  Dispense: 18 g; Refill: 3 - vitamin C (ASCORBIC ACID) 500 MG tablet; Take 0.5 tablets (250 mg total) by mouth 2 (two) times  daily for 14 days.  Dispense: 14 tablet; Refill: 0 - Cholecalciferol  (VITAMIN D3) 50 MCG (2000 UT) capsule; Take 1 capsule (2,000 Units total) by mouth daily for 14 days.  Dispense: 14 capsule; Refill: 0 - zinc gluconate 50 MG tablet; Take 1 tablet (50 mg total) by mouth daily for 14 days.  Dispense: 14 tablet; Refill: 0 - doxycycline (VIBRA-TABS) 100 MG tablet; Take 1 tablet (100 mg total) by mouth 2 (two) times daily for 10 days.  Dispense: 20 tablet; Refill: 0 - guaiFENesin-dextromethorphan (ROBITUSSIN DM) 100-10 MG/5ML syrup; Take 5 mLs by mouth every 4 (four) hours as needed for cough.  Dispense: 118 mL; Refill: 0 - SARS-COV-2 RNA,(COVID-19) QUAL NAAT  2. Non-seasonal allergic rhinitis due to pollen Request Albuterol refill  - albuterol (VENTOLIN HFA) 108 (90 Base) MCG/ACT inhaler; Inhale 2 puffs into the lungs every 4 (four) hours as needed for wheezing or shortness of breath. Please educate how to use  Dispense: 18 g; Refill: 3   Family/ staff Communication: Reviewed plan of care with patient and daughter verbalized understanding.  Labs/tests ordered: - SARS-COV-2 RNA,(COVID-19) QUAL NAAT  Next Appointment: As needed if symptoms worsen or fail to improve    Caesar Bookmaninah C Isabelly Kobler, NP

## 2020-08-08 ENCOUNTER — Other Ambulatory Visit: Payer: Self-pay | Admitting: Family

## 2020-08-08 DIAGNOSIS — E0500 Thyrotoxicosis with diffuse goiter without thyrotoxic crisis or storm: Secondary | ICD-10-CM

## 2020-08-08 DIAGNOSIS — I10 Essential (primary) hypertension: Secondary | ICD-10-CM

## 2020-08-08 DIAGNOSIS — E785 Hyperlipidemia, unspecified: Secondary | ICD-10-CM

## 2020-08-08 DIAGNOSIS — R7303 Prediabetes: Secondary | ICD-10-CM

## 2020-08-08 LAB — SARS-COV-2 RNA,(COVID-19) QUALITATIVE NAAT: SARS CoV2 RNA: NOT DETECTED

## 2020-08-09 ENCOUNTER — Other Ambulatory Visit: Payer: Medicare Other

## 2020-08-13 ENCOUNTER — Ambulatory Visit: Payer: Medicare Other | Admitting: Family

## 2020-08-21 ENCOUNTER — Ambulatory Visit (INDEPENDENT_AMBULATORY_CARE_PROVIDER_SITE_OTHER): Payer: Medicare Other | Admitting: Family

## 2020-08-21 ENCOUNTER — Other Ambulatory Visit: Payer: Self-pay

## 2020-08-21 ENCOUNTER — Encounter: Payer: Self-pay | Admitting: Family

## 2020-08-21 VITALS — BP 140/88 | HR 72 | Temp 97.3°F | Resp 16 | Ht 62.0 in | Wt 177.4 lb

## 2020-08-21 DIAGNOSIS — E0500 Thyrotoxicosis with diffuse goiter without thyrotoxic crisis or storm: Secondary | ICD-10-CM

## 2020-08-21 DIAGNOSIS — H6121 Impacted cerumen, right ear: Secondary | ICD-10-CM | POA: Diagnosis not present

## 2020-08-21 DIAGNOSIS — E785 Hyperlipidemia, unspecified: Secondary | ICD-10-CM | POA: Diagnosis not present

## 2020-08-21 DIAGNOSIS — J302 Other seasonal allergic rhinitis: Secondary | ICD-10-CM | POA: Diagnosis not present

## 2020-08-21 DIAGNOSIS — R7303 Prediabetes: Secondary | ICD-10-CM | POA: Diagnosis not present

## 2020-08-21 DIAGNOSIS — I10 Essential (primary) hypertension: Secondary | ICD-10-CM

## 2020-08-21 DIAGNOSIS — L299 Pruritus, unspecified: Secondary | ICD-10-CM | POA: Diagnosis not present

## 2020-08-21 MED ORDER — POLYETHYLENE GLYCOL 3350 17 GM/SCOOP PO POWD: 17.0000 g | Freq: Two times a day (BID) | ORAL | 1 refills | Status: DC | PRN

## 2020-08-21 MED ORDER — HYDROXYZINE PAMOATE 25 MG PO CAPS: 25.0000 mg | ORAL_CAPSULE | Freq: Two times a day (BID) | ORAL | 1 refills | Status: DC | PRN

## 2020-08-21 NOTE — Progress Notes (Signed)
Provider: Richarda Blade FNP-C   Anjelina Dung, Donalee Citrin, NP  Patient Care Team: Darlena Koval, Donalee Citrin, NP as PCP - General (Family Medicine) Aris Lot, MD as Consulting Physician (Dermatology)  Extended Emergency Contact Information Primary Emergency Contact: Terance Hart [place] Mobile Phone: [phone] Relation: Daughter Secondary Emergency Contact: booker,melonie Mobile Phone: [phone] Relation: Granddaughter  Code Status:  Full Code  Goals of care: Advanced Directive information Advanced Directives 08/21/2020  Does Patient Have a Medical Advance Directive? No  Type of Advance Directive -  Does patient want to make changes to medical advance directive? -  Copy of Healthcare Power of Attorney in Chart? -  Would patient like information on creating a medical advance directive? No - Patient declined     Chief Complaint  Patient presents with   Health Maintenance    4 month follow up and Labs.   Immunizations    Discuss the need for Shingrix vaccine.     HPI:  Pt is a 85 y.o. female seen today for 4 months follow up for medical management of chronic diseases.  She denies any acute issues.states taking care two foster children ages 54 and 9 yrs.doing well. Hard of hearing hope to get hearing aids.  Has had no weight gain or fall episode. States continue to require hydroxyzine for generalized skin itching. Need refills today.  She due for shingrix vaccine advised to get vaccine at her pharmacy.    Past Medical History:  Diagnosis Date   Eczema    Per [facility] new patient packet   Graves disease    Per [facility] new patient packet   High blood pressure    Per [facility] new patient packet   Hypertension    Past Surgical History:  Procedure Laterality Date   TOTAL ABDOMINAL HYSTERECTOMY Bilateral 1970's   TUBAL LIGATION      Allergies  Allergen Reactions   Codeine Nausea Only    unknown   Tramadol Nausea Only    Allergies as of 08/21/2020        Reactions   Codeine Nausea Only   unknown   Tramadol Nausea Only        Medication List        Accurate as of August 21, 2020 10:18 AM. If you have any questions, ask your nurse or doctor.          acetaminophen 650 MG CR tablet Commonly known as: TYLENOL Take 650 mg by mouth every 8 (eight) hours as needed for pain.   albuterol 108 (90 Base) MCG/ACT inhaler Commonly known as: VENTOLIN HFA Inhale 2 puffs into the lungs every 4 (four) hours as needed for wheezing or shortness of breath. Please educate how to use   aspirin EC 81 MG tablet Take 81 mg by mouth daily. Swallow whole.   atenolol 50 MG tablet Commonly known as: TENORMIN TAKE 1 TABLET(50 MG) BY MOUTH DAILY   augmented betamethasone dipropionate 0.05 % ointment Commonly known as: DIPROLENE-AF Apply 1 application topically daily.   cetirizine 10 MG tablet Commonly known as: ZYRTEC Take 1 tablet (10 mg total) by mouth daily.   EPINEPHrine 0.3 mg/0.3 mL Soaj injection Commonly known as: EpiPen 2-Pak Inject 0.3 mg into the muscle as needed. Inject 0.3mg  intramuscular route once as needed.   guaiFENesin-dextromethorphan 100-10 MG/5ML syrup Commonly known as: ROBITUSSIN DM Take 5 mLs by mouth every 4 (four) hours as needed for cough.   hydrOXYzine 25 MG capsule Commonly known as: VISTARIL Take 1  capsule (25 mg total) by mouth 2 (two) times daily as needed for itching.   ipratropium 0.03 % nasal spray Commonly known as: ATROVENT Place 2 sprays into both nostrils 2 (two) times daily.   mometasone 0.1 % cream Commonly known as: ELOCON Apply topically to the affected area twice daily.   olmesartan 40 MG tablet Commonly known as: BENICAR Take 1 tablet (40 mg total) by mouth daily.   Pfizer-BioNT COVID-19 Vac-TriS Susp injection Generic drug: COVID-19 mRNA Vac-TriS (Pfizer) Inject into the muscle.   simvastatin 20 MG tablet Commonly known as: ZOCOR Take 1 tablet (20 mg total) by mouth daily.   vitamin  C 500 MG tablet Commonly known as: ASCORBIC ACID Take 0.5 tablets (250 mg total) by mouth 2 (two) times daily for 14 days.   Vitamin D3 50 MCG (2000 UT) capsule Take 1 capsule (2,000 Units total) by mouth daily for 14 days.   zinc gluconate 50 MG tablet Take 1 tablet (50 mg total) by mouth daily for 14 days.        Review of Systems  Constitutional:  Negative for appetite change, chills, fatigue, fever and unexpected weight change.  HENT:  Positive for hearing loss. Negative for congestion, dental problem, ear discharge, ear pain, facial swelling, nosebleeds, postnasal drip, rhinorrhea, sinus pressure, sinus pain, sneezing, sore throat, tinnitus and trouble swallowing.   Eyes:  Negative for pain, discharge, redness, itching and visual disturbance.  Respiratory:  Negative for cough, chest tightness, shortness of breath and wheezing.   Cardiovascular:  Negative for chest pain, palpitations and leg swelling.  Gastrointestinal:  Negative for abdominal distention, abdominal pain, blood in stool, constipation, diarrhea, nausea and vomiting.  Endocrine: Negative for cold intolerance, heat intolerance, polydipsia, polyphagia and polyuria.  Genitourinary:  Negative for difficulty urinating, dysuria, flank pain, frequency and urgency.  Musculoskeletal:  Negative for arthralgias, back pain, gait problem, joint swelling, myalgias, neck pain and neck stiffness.  Skin:  Negative for color change, pallor, rash and wound.  Neurological:  Negative for dizziness, syncope, speech difficulty, weakness, light-headedness, numbness and headaches.  Hematological:  Does not bruise/bleed easily.  Psychiatric/Behavioral:  Negative for agitation, behavioral problems, confusion, hallucinations, self-injury, sleep disturbance and suicidal ideas. The patient is not nervous/anxious.    Immunization History  Administered Date(s) Administered   Fluad Quad(high Dose 65+) 10/11/2018   Influenza, High Dose Seasonal PF  12/03/2015, 11/07/2019   Influenza,inj,Quad PF,6+ Mos 10/11/2018   Influenza-Unspecified 11/16/2017   PFIZER Comirnaty(Gray Top)Covid-19 Tri-Sucrose Vaccine 07/31/2020   PFIZER(Purple Top)SARS-COV-2 Vaccination 02/18/2019, 03/11/2019, 11/29/2019, 07/31/2020   Pneumococcal Conjugate-13 10/11/2018   Tdap 10/11/2018   Pertinent  Health Maintenance Due  Topic Date Due   INFLUENZA VACCINE  09/09/2020   DEXA SCAN  Completed   PNA vac Low Risk Adult  Completed   Fall Risk  08/21/2020 08/07/2020 07/29/2020 04/11/2020 03/11/2020  Falls in the past year? 0 0 0 1 0  Number falls in past yr: 0 0 0 0 0  Injury with Fall? 0 0 0 0 0  Risk for fall due to : No Fall Risks No Fall Risks No Fall Risks - -  Follow up Falls evaluation completed Falls evaluation completed - - -   Functional Status Survey:    Vitals:   08/21/20 1007  BP: 140/88  Pulse: 72  Resp: 16  Temp: (!) 97.3 F (36.3 C)  SpO2: 93%  Weight: 177 lb 6.4 oz (80.5 kg)  Height: 5\' 2"  (1.575 m)   Body mass index  is 32.45 kg/m. Physical Exam Vitals reviewed.  Constitutional:      General: She is not in acute distress.    Appearance: Normal appearance. She is obese. She is not ill-appearing or diaphoretic.  HENT:     Head: Normocephalic.     Right Ear: There is impacted cerumen.     Left Ear: Tympanic membrane, ear canal and external ear normal. There is no impacted cerumen.     Ears:     Comments: Right ear cerumen lavaged with warm water and hydrogen peroxide small amounts of cerumen obtained using curette and Alligator forceps.Tolerated procedure well.TM clear without any signs of infection.    Nose: Nose normal. No congestion or rhinorrhea.     Mouth/Throat:     Mouth: Mucous membranes are moist.     Pharynx: Oropharynx is clear. No oropharyngeal exudate or posterior oropharyngeal erythema.  Eyes:     General: No scleral icterus.       Right eye: No discharge.        Left eye: No discharge.     Extraocular Movements:  Extraocular movements intact.     Conjunctiva/sclera: Conjunctivae normal.     Pupils: Pupils are equal, round, and reactive to light.  Neck:     Vascular: No carotid bruit.  Cardiovascular:     Rate and Rhythm: Normal rate and regular rhythm.     Pulses: Normal pulses.     Heart sounds: Normal heart sounds. No murmur heard.   No friction rub. No gallop.  Pulmonary:     Effort: Pulmonary effort is normal. No respiratory distress.     Breath sounds: Normal breath sounds. No wheezing, rhonchi or rales.  Chest:     Chest wall: No tenderness.  Abdominal:     General: Bowel sounds are normal. There is no distension.     Palpations: Abdomen is soft. There is no mass.     Tenderness: There is no abdominal tenderness. There is no right CVA tenderness, left CVA tenderness, guarding or rebound.  Musculoskeletal:        General: No swelling or tenderness. Normal range of motion.     Cervical back: Normal range of motion. No rigidity or tenderness.     Right lower leg: No edema.     Left lower leg: No edema.  Lymphadenopathy:     Cervical: No cervical adenopathy.  Skin:    General: Skin is warm and dry.     Coloration: Skin is not pale.     Findings: No bruising, erythema, lesion or rash.  Neurological:     Mental Status: She is alert and oriented to person, place, and time.     Cranial Nerves: No cranial nerve deficit.     Sensory: No sensory deficit.     Motor: No weakness.     Coordination: Coordination normal.     Gait: Gait normal.  Psychiatric:        Mood and Affect: Mood normal.        Speech: Speech normal.        Behavior: Behavior normal.        Thought Content: Thought content normal.        Judgment: Judgment normal.    Labs reviewed: Recent Labs    09/29/19 0926  NA 139  K 4.2  CL 105  CO2 26  GLUCOSE 108*  BUN 19  CREATININE 1.05*  CALCIUM 8.8   Recent Labs    09/29/19 0926  AST 14  ALT 9  BILITOT 0.3  PROT 7.0   Recent Labs    09/29/19 0926  07/29/20 1621  WBC 5.9 5.8  NEUTROABS 3,882 3,880  HGB 12.8 13.3  HCT 39.4 40.8  MCV 90.8 89.5  PLT 257 270   Lab Results  Component Value Date   TSH 0.70 07/29/2020   Lab Results  Component Value Date   HGBA1C 5.9 (H) 09/29/2019   Lab Results  Component Value Date   CHOL 164 09/29/2019   HDL 55 09/29/2019   LDLCALC 90 09/29/2019   TRIG 91 09/29/2019   CHOLHDL 3.0 09/29/2019    Significant Diagnostic Results in last 30 days:  No results found.  Assessment/Plan 1. Essential hypertension B/p well controlled. continue on Olmestartan and Atenolol - on ASA and Statin   2. Hyperlipidemia LDL goal <100 Latest LDL at goal  Continue on simvastatin   3. Prediabetes Lab Results  Component Value Date   HGBA1C 5.9 (H) 09/29/2019  Dietary modification and exercise advised.  4. Seasonal allergies Cetirizine effective  5. Itching Reports generalized itching without any rash noted. - continue on Hydroxyzine - hydrOXYzine (VISTARIL) 25 MG capsule; Take 1 capsule (25 mg total) by mouth 2 (two) times daily as needed for itching.  Dispense: 30 capsule; Refill: 1  6. Right ear impacted cerumen Right ear cerumen lavaged with warm water and hydrogen peroxide small amounts of cerumen obtained using curette and Alligator forceps.Tolerated procedure well.TM clear without any signs of infection.  Family/ staff Communication: Reviewed plan of care with patient verbalized understanding.   Labs/tests ordered: Has lab orders to be drawn today.  Next Appointment : 6 months for medical management of chronic issues.   Caesar Bookman, NP

## 2020-08-21 NOTE — Patient Instructions (Signed)

## 2020-08-22 ENCOUNTER — Other Ambulatory Visit: Payer: Self-pay

## 2020-08-22 DIAGNOSIS — R7303 Prediabetes: Secondary | ICD-10-CM

## 2020-08-22 DIAGNOSIS — I10 Essential (primary) hypertension: Secondary | ICD-10-CM

## 2020-08-22 DIAGNOSIS — E785 Hyperlipidemia, unspecified: Secondary | ICD-10-CM

## 2020-08-22 DIAGNOSIS — E0500 Thyrotoxicosis with diffuse goiter without thyrotoxic crisis or storm: Secondary | ICD-10-CM

## 2020-08-22 LAB — CBC WITH DIFFERENTIAL/PLATELET
Absolute Monocytes: 287 cells/uL (ref 200–950)
Basophils Absolute: 31 cells/uL (ref 0–200)
Basophils Relative: 0.5 %
Eosinophils Absolute: 98 cells/uL (ref 15–500)
Eosinophils Relative: 1.6 %
HCT: 41 % (ref 35.0–45.0)
Hemoglobin: 13.1 g/dL (ref 11.7–15.5)
Lymphs Abs: 1153 cells/uL (ref 850–3900)
MCH: 29.2 pg (ref 27.0–33.0)
MCHC: 32 g/dL (ref 32.0–36.0)
MCV: 91.5 fL (ref 80.0–100.0)
MPV: 10.3 fL (ref 7.5–12.5)
Monocytes Relative: 4.7 %
Neutro Abs: 4532 cells/uL (ref 1500–7800)
Neutrophils Relative %: 74.3 %
Platelets: 273 10*3/uL (ref 140–400)
RBC: 4.48 10*6/uL (ref 3.80–5.10)
RDW: 13.3 % (ref 11.0–15.0)
Total Lymphocyte: 18.9 %
WBC: 6.1 10*3/uL (ref 3.8–10.8)

## 2020-08-22 LAB — COMPLETE METABOLIC PANEL WITH GFR
AG Ratio: 1.3 (calc) (ref 1.0–2.5)
ALT: 13 U/L (ref 6–29)
AST: 17 U/L (ref 10–35)
Albumin: 4.2 g/dL (ref 3.6–5.1)
Alkaline phosphatase (APISO): 103 U/L (ref 37–153)
BUN: 19 mg/dL (ref 7–25)
CO2: 27 mmol/L (ref 20–32)
Calcium: 9.2 mg/dL (ref 8.6–10.4)
Chloride: 106 mmol/L (ref 98–110)
Creat: 0.91 mg/dL (ref 0.60–0.95)
Globulin: 3.2 g/dL (calc) (ref 1.9–3.7)
Glucose, Bld: 89 mg/dL (ref 65–99)
Potassium: 4.3 mmol/L (ref 3.5–5.3)
Sodium: 142 mmol/L (ref 135–146)
Total Bilirubin: 0.5 mg/dL (ref 0.2–1.2)
Total Protein: 7.4 g/dL (ref 6.1–8.1)
eGFR: 63 mL/min/{1.73_m2} (ref >=60)

## 2020-08-22 LAB — HEMOGLOBIN A1C
Hgb A1c MFr Bld: 5.9 % of total Hgb — ABNORMAL HIGH (ref <5.7)
Mean Plasma Glucose: 123 mg/dL
eAG (mmol/L): 6.8 mmol/L

## 2020-08-22 LAB — LIPID PANEL
Cholesterol: 160 mg/dL (ref <200)
HDL: 58 mg/dL (ref >=50)
LDL Cholesterol (Calc): 87 mg/dL (calc)
Non-HDL Cholesterol (Calc): 102 mg/dL (calc) (ref <130)
Total CHOL/HDL Ratio: 2.8 (calc) (ref <5.0)
Triglycerides: 64 mg/dL (ref <150)

## 2020-08-22 LAB — TSH: TSH: 1.07 mIU/L (ref 0.40–4.50)

## 2020-09-04 ENCOUNTER — Telehealth: Payer: Self-pay | Admitting: *Deleted

## 2020-09-04 NOTE — Telephone Encounter (Signed)
[name] Completed form.  Patient notified. LMOM that form was ready and will leave up front for pick up.  Copy made and sent for scanning.

## 2020-09-04 NOTE — Telephone Encounter (Signed)
Patient dropped off form for [place] [employer].  Patient is needing form filled out due to keeping [employer] in home.  Patient filled out form and then realized that Provider was to do it.   Placed form in [name]'s folder to review and sign.  Once completed please call patient at #[phone] to pick up.

## 2020-09-07 ENCOUNTER — Other Ambulatory Visit: Payer: Self-pay | Admitting: Family

## 2020-09-07 DIAGNOSIS — J301 Allergic rhinitis due to pollen: Secondary | ICD-10-CM

## 2020-10-09 ENCOUNTER — Telehealth: Payer: Self-pay | Admitting: Family

## 2020-10-09 NOTE — Telephone Encounter (Signed)
I called the patient and left a vm to call the office back so we can schedule an awv her last one was done 09/08/18

## 2020-10-16 ENCOUNTER — Other Ambulatory Visit: Payer: Self-pay | Admitting: Family

## 2020-10-16 ENCOUNTER — Other Ambulatory Visit: Payer: Self-pay | Admitting: *Deleted

## 2020-10-16 DIAGNOSIS — I10 Essential (primary) hypertension: Secondary | ICD-10-CM

## 2020-10-16 MED ORDER — OLMESARTAN MEDOXOMIL 40 MG PO TABS: 40.0000 mg | ORAL_TABLET | Freq: Every day | ORAL | 1 refills | Status: DC

## 2020-10-16 NOTE — Telephone Encounter (Signed)
Pharmacy requested refill

## 2020-10-25 ENCOUNTER — Other Ambulatory Visit: Payer: Self-pay

## 2020-10-25 ENCOUNTER — Ambulatory Visit (INDEPENDENT_AMBULATORY_CARE_PROVIDER_SITE_OTHER): Payer: Medicare Other | Admitting: Adult Health

## 2020-10-25 ENCOUNTER — Encounter: Payer: Self-pay | Admitting: Adult Health

## 2020-10-25 VITALS — BP 128/86 | HR 93 | Temp 98.0°F | Ht 62.0 in

## 2020-10-25 DIAGNOSIS — J101 Influenza due to other identified influenza virus with other respiratory manifestations: Secondary | ICD-10-CM | POA: Diagnosis not present

## 2020-10-25 DIAGNOSIS — R059 Cough, unspecified: Secondary | ICD-10-CM

## 2020-10-25 DIAGNOSIS — J069 Acute upper respiratory infection, unspecified: Secondary | ICD-10-CM

## 2020-10-25 LAB — POCT INFLUENZA A/B
Influenza A, POC: POSITIVE — AB
Influenza B, POC: NEGATIVE

## 2020-10-25 MED ORDER — OSELTAMIVIR PHOSPHATE 75 MG PO CAPS: 75.0000 mg | ORAL_CAPSULE | Freq: Two times a day (BID) | ORAL | 0 refills | Status: AC

## 2020-10-25 MED ORDER — VITAMIN C 1000 MG PO TABS: 1000.0000 mg | ORAL_TABLET | Freq: Every day | ORAL | 0 refills | Status: AC

## 2020-10-25 MED ORDER — GUAIFENESIN-DM 100-10 MG/5ML PO SYRP: 5.0000 mL | ORAL_SOLUTION | ORAL | 0 refills | Status: DC | PRN

## 2020-10-25 NOTE — Progress Notes (Signed)
[facility]  Provider:  Kenard Gower  DNP  Code Status:  Full Code  Goals of Care:  Advanced Directives 08/21/2020  Does Patient Have a Medical Advance Directive? No  Type of Advance Directive -  Does patient want to make changes to medical advance directive? -  Copy of Healthcare Power of Attorney in Chart? -  Would patient like information on creating a medical advance directive? No - Patient declined     Chief Complaint  Patient presents with   Acute Visit    Patient presents today for upper respiratory infection symptoms. She reports cough, chest congestion, runny nose and SOB for the past 2 days now. She denies any fever or chills. She had a known exposure to flu from a grandson.     HPI: Patient is a 85 y.o. female seen today for an acute visit for productive cough with yellowish phlegm, chest congestion and runny nose. She denies having fever but has chills. She has 3 COVID-19 vaccines and no known exposures to COVID-19. She was, however, was exposed to her grandson who had flu. She presented in the clinic today with her grandson. She tested positive for influenza A. She stated that she can taste and smell her food.                                                                                                                                                      Past Medical History:  Diagnosis Date   Eczema    Per [facility] new patient packet   Graves disease    Per [facility] new patient packet   High blood pressure    Per [facility] new patient packet   Hypertension     Past Surgical History:  Procedure Laterality Date   TOTAL ABDOMINAL HYSTERECTOMY Bilateral 1970's   TUBAL LIGATION      Allergies  Allergen Reactions   Codeine Nausea Only    unknown   Tramadol Nausea Only    Outpatient Encounter Medications as of 10/25/2020  Medication Sig   acetaminophen (TYLENOL) 650 MG CR tablet Take 650 mg by mouth every 8 (eight) hours as needed for pain.   albuterol (VENTOLIN  HFA) 108 (90 Base) MCG/ACT inhaler Inhale 2 puffs into the lungs every 4 (four) hours as needed for wheezing or shortness of breath. Please educate how to use   aspirin EC 81 MG tablet Take 81 mg by mouth daily. Swallow whole.   atenolol (TENORMIN) 50 MG tablet TAKE 1 TABLET(50 MG) BY MOUTH DAILY   augmented betamethasone dipropionate (DIPROLENE-AF) 0.05 % ointment Apply 1 application topically daily.   cetirizine (ZYRTEC) 10 MG tablet Take 1 tablet (10 mg total) by mouth daily.   COVID-19 mRNA Vac-TriS, Pfizer, (PFIZER-BIONT COVID-19 VAC-TRIS) SUSP injection Inject into the muscle.   EPINEPHrine (EPIPEN 2-PAK) 0.3  mg/0.3 mL IJ SOAJ injection Inject 0.3 mg into the muscle as needed. Inject 0.3mg  intramuscular route once as needed.   guaiFENesin-dextromethorphan (ROBITUSSIN DM) 100-10 MG/5ML syrup Take 5 mLs by mouth every 4 (four) hours as needed for cough.   ipratropium (ATROVENT) 0.03 % nasal spray USE 2 SPRAYS IN EACH NOSTRIL TWICE DAILY   mometasone (ELOCON) 0.1 % cream Apply topically to the affected area twice daily.   olmesartan (BENICAR) 40 MG tablet Take 1 tablet (40 mg total) by mouth daily.   polyethylene glycol powder (GLYCOLAX/MIRALAX) 17 GM/SCOOP powder Take 17 g by mouth 2 (two) times daily as needed.   simvastatin (ZOCOR) 20 MG tablet Take 1 tablet (20 mg total) by mouth daily.   [DISCONTINUED] hydrOXYzine (VISTARIL) 25 MG capsule Take 1 capsule (25 mg total) by mouth 2 (two) times daily as needed for itching.   No facility-administered encounter medications on file as of 10/25/2020.    Review of Systems:  Review of Systems  Constitutional:  Positive for chills. Negative for appetite change and fever.  HENT:  Positive for rhinorrhea. Negative for sinus pain and sore throat.   Respiratory:  Positive for cough. Negative for shortness of breath and wheezing.        Has productive cough with yellowish phlegm  Gastrointestinal:  Positive for diarrhea. Negative for abdominal pain and  vomiting.  Genitourinary:  Negative for difficulty urinating and dysuria.  Musculoskeletal: Negative.   Neurological:  Negative for dizziness and headaches.   Health Maintenance  Topic Date Due   Zoster Vaccines- Shingrix (1 of 2) Never done   INFLUENZA VACCINE  09/09/2020   TETANUS/TDAP  10/10/2028   DEXA SCAN  Completed   COVID-19 Vaccine  Completed   HPV VACCINES  Aged Out    Physical Exam: Vitals:   10/25/20 1514  BP: 128/86  Pulse: 93  Temp: 98 F (36.7 C)  SpO2: 93%  Height: 5\' 2"  (1.575 m)   Body mass index is 32.45 kg/m. Physical Exam Constitutional:      Appearance: Normal appearance. She is obese.  HENT:     Head: Normocephalic and atraumatic.  Eyes:     Conjunctiva/sclera: Conjunctivae normal.  Cardiovascular:     Rate and Rhythm: Normal rate and regular rhythm.     Pulses: Normal pulses.     Heart sounds: Normal heart sounds.  Abdominal:     Palpations: Abdomen is soft.  Musculoskeletal:        General: Normal range of motion.     Cervical back: Normal range of motion.  Skin:    General: Skin is warm and dry.  Neurological:     General: No focal deficit present.     Mental Status: She is alert. Mental status is at baseline.  Psychiatric:        Mood and Affect: Mood normal.        Behavior: Behavior normal.        Thought Content: Thought content normal.    Labs reviewed: Basic Metabolic Panel: Recent Labs    07/29/20 1621 08/21/20 1056  NA  --  142  K  --  4.3  CL  --  106  CO2  --  27  GLUCOSE  --  89  BUN  --  19  CREATININE  --  0.91  CALCIUM  --  9.2  TSH 0.70 1.07   Liver Function Tests: Recent Labs    08/21/20 1056  AST 17  ALT 13  BILITOT  0.5  PROT 7.4   No results for input(s): LIPASE, AMYLASE in the last 8760 hours. No results for input(s): AMMONIA in the last 8760 hours. CBC: Recent Labs    07/29/20 1621 08/21/20 1056  WBC 5.8 6.1  NEUTROABS 3,880 4,532  HGB 13.3 13.1  HCT 40.8 41.0  MCV 89.5 91.5  PLT  270 273   Lipid Panel: Recent Labs    08/21/20 1056  CHOL 160  HDL 58  LDLCALC 87  TRIG 64  CHOLHDL 2.8   Lab Results  Component Value Date   HGBA1C 5.9 (H) 08/21/2020    Procedures since last visit: No results found.  Assessment/Plan  1. Upper respiratory tract infection, unspecified type - SARS-COV-2 RNA,(COVID-19) QUAL NAAT - POC Influenza A/B  2. Cough - SARS-COV-2 RNA,(COVID-19) QUAL NAAT - guaiFENesin-dextromethorphan (ROBITUSSIN DM) 100-10 MG/5ML syrup; Take 5 mLs by mouth every 4 (four) hours as needed for cough.  Dispense: 118 mL; Refill: 0  3. Influenza A -  tested + for Infuenza A - oseltamivir (TAMIFLU) 75 MG capsule; Take 1 capsule (75 mg total) by mouth 2 (two) times daily for 5 days.  Dispense: 10 capsule; Refill: 0 -  will need to follow up in clinic if no improvement   Labs/tests ordered:   SARS-COV-2 RNA,(COVID-19) QUAL NAAT, POC Influenza A/B  Next appt:  02/19/2021

## 2020-10-25 NOTE — Patient Instructions (Signed)
Influenza, Adult °Influenza is also called "the flu." It is an infection in the lungs, nose, and throat (respiratory tract). It spreads easily from person to person (is contagious). The flu causes symptoms that are like a cold, along with high fever and body aches. °What are the causes? °This condition is caused by the influenza virus. You can get the virus by: °Breathing in droplets that are in the air after a person infected with the flu coughed or sneezed. °Touching something that has the virus on it and then touching your mouth, nose, or eyes. °What increases the risk? °Certain things may make you more likely to get the flu. These include: °Not washing your hands often. °Having close contact with many people during cold and flu season. °Touching your mouth, eyes, or nose without first washing your hands. °Not getting a flu shot every year. °You may have a higher risk for the flu, and serious problems, such as a lung infection (pneumonia), if you: °Are older than 65. °Are pregnant. °Have a weakened disease-fighting system (immune system) because of a disease or because you are taking certain medicines. °Have a long-term (chronic) condition, such as: °Heart, kidney, or lung disease. °Diabetes. °Asthma. °Have a liver disorder. °Are very overweight (morbidly obese). °Have anemia. °What are the signs or symptoms? °Symptoms usually begin suddenly and last 4-14 days. They may include: °Fever and chills. °Headaches, body aches, or muscle aches. °Sore throat. °Cough. °Runny or stuffy (congested) nose. °Feeling discomfort in your chest. °Not wanting to eat as much as normal. °Feeling weak or tired. °Feeling dizzy. °Feeling sick to your stomach or throwing up. °How is this treated? °If the flu is found early, you can be treated with antiviral medicine. This can help to reduce how bad the illness is and how long it lasts. This may be given by mouth or through an IV tube. °Taking care of yourself at home can help your  symptoms get better. Your doctor may want you to: °Take over-the-counter medicines. °Drink plenty of fluids. °The flu often goes away on its own. If you have very bad symptoms or other problems, you may be treated in a hospital. °Follow these instructions at home: °  °Activity °Rest as needed. Get plenty of sleep. °Stay home from work or school as told by your doctor. °Do not leave home until you do not have a fever for 24 hours without taking medicine. °Leave home only to go to your doctor. °Eating and drinking °Take an ORS (oral rehydration solution). This is a drink that is sold at pharmacies and stores. °Drink enough fluid to keep your pee pale yellow. °Drink clear fluids in small amounts as you are able. Clear fluids include: °Water. °Ice chips. °Fruit juice mixed with water. °Low-calorie sports drinks. °Eat bland foods that are easy to digest. Eat small amounts as you are able. These foods include: °Bananas. °Applesauce. °Rice. °Lean meats. °Toast. °Crackers. °Do not eat or drink: °Fluids that have a lot of sugar or caffeine. °Alcohol. °Spicy or fatty foods. °General instructions °Take over-the-counter and prescription medicines only as told by your doctor. °Use a cool mist humidifier to add moisture to the air in your home. This can make it easier for you to breathe. °When using a cool mist humidifier, clean it daily. Empty water and replace with clean water. °Cover your mouth and nose when you cough or sneeze. °Wash your hands with soap and water often and for at least 20 seconds. This is also important after   you cough or sneeze. If you cannot use soap and water, use alcohol-based hand sanitizer. °Keep all follow-up visits. °How is this prevented? ° °Get a flu shot every year. You may get the flu shot in late summer, fall, or winter. Ask your doctor when you should get your flu shot. °Avoid contact with people who are sick during fall and winter. This is cold and flu season. °Contact a doctor if: °You get  new symptoms. °You have: °Chest pain. °Watery poop (diarrhea). °A fever. °Your cough gets worse. °You start to have more mucus. °You feel sick to your stomach. °You throw up. °Get help right away if you: °Have shortness of breath. °Have trouble breathing. °Have skin or nails that turn a bluish color. °Have very bad pain or stiffness in your neck. °Get a sudden headache. °Get sudden pain in your face or ear. °Cannot eat or drink without throwing up. °These symptoms may represent a serious problem that is an emergency. Get medical help right away. Call your local emergency services (911 in the [place] °Do not wait to see if the symptoms will go away. °Do not drive yourself to the hospital. °Summary °Influenza is also called "the flu." It is an infection in the lungs, nose, and throat. It spreads easily from person to person. °Take over-the-counter and prescription medicines only as told by your doctor. °Getting a flu shot every year is the best way to not get the flu. °This information is not intended to replace advice given to you by your health care provider. Make sure you discuss any questions you have with your health care provider. °Document Revised: 09/15/2019 Document Reviewed: 09/15/2019 °Elsevier Patient Education © 2022 [employer] ° °

## 2020-10-26 LAB — SARS-COV-2 RNA,(COVID-19) QUALITATIVE NAAT: SARS CoV2 RNA: NOT DETECTED

## 2020-10-28 NOTE — Progress Notes (Signed)
COVID -19 test is negative

## 2020-10-31 ENCOUNTER — Other Ambulatory Visit: Payer: Self-pay

## 2020-10-31 ENCOUNTER — Telehealth (INDEPENDENT_AMBULATORY_CARE_PROVIDER_SITE_OTHER): Payer: Medicare Other | Admitting: Nurse Practitioner

## 2020-10-31 ENCOUNTER — Telehealth: Payer: Self-pay

## 2020-10-31 DIAGNOSIS — R058 Other specified cough: Secondary | ICD-10-CM

## 2020-10-31 MED ORDER — BENZONATATE 100 MG PO CAPS: 100.0000 mg | ORAL_CAPSULE | Freq: Three times a day (TID) | ORAL | 0 refills | Status: DC

## 2020-10-31 NOTE — Telephone Encounter (Signed)
Ashley Johns, Ashley Johns are scheduled for a virtual visit with your provider today.    Just as we do with appointments in the office, we must obtain your consent to participate.  Your consent will be active for this visit and any virtual visit you may have with one of our providers in the next 365 days.    If you have a MyChart account, I can also send a copy of this consent to you electronically.  All virtual visits are billed to your insurance company just like a traditional visit in the office.  As this is a virtual visit, video technology does not allow for your provider to perform a traditional examination.  This may limit your provider's ability to fully assess your condition.  If your provider identifies any concerns that need to be evaluated in person or the need to arrange testing such as labs, EKG, etc, we will make arrangements to do so.    Although advances in technology are sophisticated, we cannot ensure that it will always work on either your end or our end.  If the connection with a video visit is poor, we may have to switch to a telephone visit.  With either a video or telephone visit, we are not always able to ensure that we have a secure connection.   I need to obtain your verbal consent now.   Are you willing to proceed with your visit today?   Ashley Johns has provided verbal consent on 10/31/2020 for a virtual visit (video or telephone).   Elveria Royals, CMA 10/31/2020  2:03 PM

## 2020-10-31 NOTE — Progress Notes (Signed)
This service is provided via telemedicine  No vital signs collected/recorded due to the encounter was a telemedicine visit.   Location of patient (ex: home, work):  Home  Patient consents to a telephone visit:  Yes, see encounter dated 10/31/2020  Location of the provider (ex: office, home):  [facility]  Name of any referring provider:  Ngetich, [name], NP  Names of all persons participating in the telemedicine service and their role in the encounter:  Abbey Chatters, Nurse Practitioner, Elveria Royals, CMA, and patient.   Time spent on call:  9 minutes with medical assistant

## 2020-10-31 NOTE — Progress Notes (Signed)
Careteam: Patient Care Team: Ngetich, Donalee Citrin, NP as PCP - General (Family Medicine) Aris Lot, MD as Consulting Physician (Dermatology)  Advanced Directive information    Allergies  Allergen Reactions   Codeine Nausea Only    unknown   Tramadol Nausea Only    Chief Complaint  Patient presents with   Acute Visit    Patient complains of cough that's not getting better. Patient took home COVID test that was negative. Took COVID test 10/25/2020. Patient has had cough since Patient tested postive for Flu on 10/25/20. Patient has taken Robitussin DM. Cough is dry. Patient finishe Tamiflu yesterday     HPI: Patient is a 85 y.o. female for ongoing cough after flu. She was diagnosed with flu on 10/25/20. Reports she is feeling better from the flu but the cough is ongoing. Wearing her out.  No fever or chills.  Reports cough- not coughing anything up.  Reports she is having issues with incontinence due to cough.  Reports no shortness of breath, no LE edema. No chest pains. No wheezing.   Review of Systems:  Review of Systems  Constitutional:  Negative for chills and fever.  HENT:  Negative for congestion.   Respiratory:  Positive for cough. Negative for hemoptysis, sputum production, shortness of breath and wheezing.   Cardiovascular:  Negative for chest pain and palpitations.  Neurological:  Negative for dizziness and headaches.   Past Medical History:  Diagnosis Date   Eczema    Per [facility] new patient packet   Graves disease    Per [facility] new patient packet   High blood pressure    Per [facility] new patient packet   Hypertension    Past Surgical History:  Procedure Laterality Date   TOTAL ABDOMINAL HYSTERECTOMY Bilateral 1970's   TUBAL LIGATION     Social History:   reports that she has quit smoking. She has never used smokeless tobacco. She reports that she does not drink alcohol and does not use drugs.  Family History  Problem Relation Age of Onset   Tuberculosis  Mother    Hypertension Father     Medications: Patient's Medications  New Prescriptions   BENZONATATE (TESSALON) 100 MG CAPSULE    Take 1 capsule (100 mg total) by mouth 3 (three) times daily.  Previous Medications   ACETAMINOPHEN (TYLENOL) 650 MG CR TABLET    Take 650 mg by mouth every 8 (eight) hours as needed for pain.   ALBUTEROL (VENTOLIN HFA) 108 (90 BASE) MCG/ACT INHALER    Inhale 2 puffs into the lungs every 4 (four) hours as needed for wheezing or shortness of breath. Please educate how to use   ASCORBIC ACID (VITAMIN C) 1000 MG TABLET    Take 1 tablet (1,000 mg total) by mouth daily for 10 days.   ASPIRIN EC 81 MG TABLET    Take 81 mg by mouth daily. Swallow whole.   ATENOLOL (TENORMIN) 50 MG TABLET    TAKE 1 TABLET(50 MG) BY MOUTH DAILY   AUGMENTED BETAMETHASONE DIPROPIONATE (DIPROLENE-AF) 0.05 % OINTMENT    Apply 1 application topically daily.   CETIRIZINE (ZYRTEC) 10 MG TABLET    Take 1 tablet (10 mg total) by mouth daily.   COVID-19 MRNA VAC-TRIS, PFIZER, (PFIZER-BIONT COVID-19 VAC-TRIS) SUSP INJECTION    Inject into the muscle.   EPINEPHRINE (EPIPEN 2-PAK) 0.3 MG/0.3 ML IJ SOAJ INJECTION    Inject 0.3 mg into the muscle as needed. Inject 0.3mg  intramuscular route once as needed.  GUAIFENESIN-DEXTROMETHORPHAN (ROBITUSSIN DM) 100-10 MG/5ML SYRUP    Take 5 mLs by mouth every 4 (four) hours as needed for cough.   IPRATROPIUM (ATROVENT) 0.03 % NASAL SPRAY    USE 2 SPRAYS IN EACH NOSTRIL TWICE DAILY   MOMETASONE (ELOCON) 0.1 % CREAM    Apply topically to the affected area twice daily.   OLMESARTAN (BENICAR) 40 MG TABLET    Take 1 tablet (40 mg total) by mouth daily.   POLYETHYLENE GLYCOL POWDER (GLYCOLAX/MIRALAX) 17 GM/SCOOP POWDER    Take 17 g by mouth 2 (two) times daily as needed.   SIMVASTATIN (ZOCOR) 20 MG TABLET    Take 1 tablet (20 mg total) by mouth daily.  Modified Medications   No medications on file  Discontinued Medications   No medications on file    Physical  Exam:  There were no vitals filed for this visit. There is no height or weight on file to calculate BMI. Wt Readings from Last 3 Encounters:  08/21/20 177 lb 6.4 oz (80.5 kg)  08/07/20 177 lb 3.2 oz (80.4 kg)  07/29/20 180 lb (81.6 kg)      Labs reviewed: Basic Metabolic Panel: Recent Labs    07/29/20 1621 08/21/20 1056  NA  --  142  K  --  4.3  CL  --  106  CO2  --  27  GLUCOSE  --  89  BUN  --  19  CREATININE  --  0.91  CALCIUM  --  9.2  TSH 0.70 1.07    Liver Function Tests: Recent Labs    08/21/20 1056  AST 17  ALT 13  BILITOT 0.5  PROT 7.4    No results for input(s): LIPASE, AMYLASE in the last 8760 hours. No results for input(s): AMMONIA in the last 8760 hours. CBC: Recent Labs    07/29/20 1621 08/21/20 1056  WBC 5.8 6.1  NEUTROABS 3,880 4,532  HGB 13.3 13.1  HCT 40.8 41.0  MCV 89.5 91.5  PLT 270 273    Lipid Panel: Recent Labs    08/21/20 1056  CHOL 160  HDL 58  LDLCALC 87  TRIG 64  CHOLHDL 2.8    TSH: Recent Labs    07/29/20 1621 08/21/20 1056  TSH 0.70 1.07    A1C: Lab Results  Component Value Date   HGBA1C 5.9 (H) 08/21/2020     Assessment/Plan 1. Post-viral cough syndrome Overall feeling better from flu but continues with cough. Encouraged to stay hydrated -continue mucinex DM by mouth twice daily with full glass of water. -to follow up if symptoms worsen, fever, shortness of breath, congestion, wheezing occurs. Will add tessalon PRN -can also use albuterol which she has if needed.  - benzonatate (TESSALON) 100 MG capsule; Take 1 capsule (100 mg total) by mouth 3 (three) times daily as needed cough..  Dispense: 30 capsule; Refill: 0   Kerie Badger K. Biagio Borg  [facility] [phone]    Virtual Visit via telephone- unable to do virtual due to camera issues  I connected with patient on 10/31/20 at  1:30 PM EDT by telephone and verified that I am speaking with the correct person using  two identifiers.  Location: Patient: home Provider: [facility]   I discussed the limitations, risks, security and privacy concerns of performing an evaluation and management service by telephone and the availability of in person appointments. I also discussed with the patient that there may be a patient responsible charge related  to this service. The patient expressed understanding and agreed to proceed.   I discussed the assessment and treatment plan with the patient. The patient was provided an opportunity to ask questions and all were answered. The patient agreed with the plan and demonstrated an understanding of the instructions.   The patient was advised to call back or seek an in-person evaluation if the symptoms worsen or if the condition fails to improve as anticipated.  I provided 18 minutes of non-face-to-face time during this encounter.  Janene Harvey. Biagio Borg Avs printed and mailed

## 2020-11-06 ENCOUNTER — Encounter: Payer: Self-pay | Admitting: Adult Health

## 2020-11-06 ENCOUNTER — Other Ambulatory Visit: Payer: Self-pay

## 2020-11-06 ENCOUNTER — Ambulatory Visit (INDEPENDENT_AMBULATORY_CARE_PROVIDER_SITE_OTHER): Payer: Medicare Other | Admitting: Adult Health

## 2020-11-06 VITALS — BP 140/90 | HR 81 | Temp 96.9°F | Ht 62.0 in | Wt 171.4 lb

## 2020-11-06 DIAGNOSIS — J101 Influenza due to other identified influenza virus with other respiratory manifestations: Secondary | ICD-10-CM | POA: Diagnosis not present

## 2020-11-06 DIAGNOSIS — R058 Other specified cough: Secondary | ICD-10-CM

## 2020-11-06 MED ORDER — ATROVENT HFA 17 MCG/ACT IN AERS: 2.0000 | INHALATION_SPRAY | Freq: Four times a day (QID) | RESPIRATORY_TRACT | 0 refills | Status: DC | PRN

## 2020-11-06 NOTE — Patient Instructions (Signed)

## 2020-11-06 NOTE — Progress Notes (Signed)
[facility] clinic  Provider:   Kenard Gower DNP  Code Status:  Full Code  Goals of Care:  Advanced Directives 08/21/2020  Does Patient Have a Medical Advance Directive? No  Type of Advance Directive -  Does patient want to make changes to medical advance directive? -  Copy of Healthcare Power of Attorney in Chart? -  Would patient like information on creating a medical advance directive? No - Patient declined     Chief Complaint  Patient presents with   Acute Visit    Patient has nagging cough that won't go away. Patient has had cough since having Flu about 2 weeks ago. Patient has white phlegm when coughing. No fever. Patient has pain in back when coughing.    HPI: Patient is a 85 y.o. female seen today for an acute visit for persistent cough. She was treated with Tamiflu  75 mg twice daily X 5 for Influenza A on 10/25/20. She comes back at [facility] today for persistent cough. She denies fever, chills nor body aches. She complains of productive cough with whitish phlegm.  Past Medical History:  Diagnosis Date   Eczema    Per [facility] new patient packet   Graves disease    Per [facility] new patient packet   High blood pressure    Per [facility] new patient packet   Hypertension     Past Surgical History:  Procedure Laterality Date   TOTAL ABDOMINAL HYSTERECTOMY Bilateral 1970's   TUBAL LIGATION      Allergies  Allergen Reactions   Codeine Nausea Only    unknown   Tramadol Nausea Only    Outpatient Encounter Medications as of 11/06/2020  Medication Sig   acetaminophen (TYLENOL) 650 MG CR tablet Take 650 mg by mouth every 8 (eight) hours as needed for pain.   albuterol (VENTOLIN HFA) 108 (90 Base) MCG/ACT inhaler Inhale 2 puffs into the lungs every 4 (four) hours as needed for wheezing or shortness of breath. Please educate how to use   aspirin EC 81 MG tablet Take 81 mg by mouth daily. Swallow whole.   atenolol (TENORMIN) 50 MG tablet TAKE 1 TABLET(50 MG) BY MOUTH DAILY    augmented betamethasone dipropionate (DIPROLENE-AF) 0.05 % ointment Apply 1 application topically daily.   benzonatate (TESSALON) 100 MG capsule Take 1 capsule (100 mg total) by mouth 3 (three) times daily.   cetirizine (ZYRTEC) 10 MG tablet Take 1 tablet (10 mg total) by mouth daily.   COVID-19 mRNA Vac-TriS, Pfizer, (PFIZER-BIONT COVID-19 VAC-TRIS) SUSP injection Inject into the muscle.   EPINEPHrine (EPIPEN 2-PAK) 0.3 mg/0.3 mL IJ SOAJ injection Inject 0.3 mg into the muscle as needed. Inject 0.3mg  intramuscular route once as needed.   guaiFENesin-dextromethorphan (ROBITUSSIN DM) 100-10 MG/5ML syrup Take 5 mLs by mouth every 4 (four) hours as needed for cough.   ipratropium (ATROVENT) 0.03 % nasal spray USE 2 SPRAYS IN EACH NOSTRIL TWICE DAILY   mometasone (ELOCON) 0.1 % cream Apply topically to the affected area twice daily.   olmesartan (BENICAR) 40 MG tablet Take 1 tablet (40 mg total) by mouth daily.   polyethylene glycol powder (GLYCOLAX/MIRALAX) 17 GM/SCOOP powder Take 17 g by mouth 2 (two) times daily as needed.   simvastatin (ZOCOR) 20 MG tablet Take 1 tablet (20 mg total) by mouth daily.   No facility-administered encounter medications on file as of 11/06/2020.    Review of Systems:  Review of Systems  Constitutional:  Negative for activity change, appetite change, chills  and fever.  HENT:  Negative for congestion and sore throat.   Eyes: Negative.   Respiratory:  Positive for cough. Negative for chest tightness, shortness of breath and wheezing.   Cardiovascular:  Negative for leg swelling.  Gastrointestinal:  Negative for abdominal pain, constipation and diarrhea.  Endocrine: Negative.   Genitourinary:  Negative for difficulty urinating and frequency.  Musculoskeletal:  Negative for gait problem and joint swelling.  Neurological:  Negative for dizziness and headaches.  Psychiatric/Behavioral: Negative.     Health Maintenance  Topic Date Due   Zoster Vaccines- Shingrix (1  of 2) Never done   INFLUENZA VACCINE  09/09/2020   TETANUS/TDAP  10/10/2028   DEXA SCAN  Completed   COVID-19 Vaccine  Completed   HPV VACCINES  Aged Out    Physical Exam: Vitals:   11/06/20 1420  BP: 140/90  Pulse: 81  Temp: (!) 96.9 F (36.1 C)  SpO2: 94%  Weight: 171 lb 6.4 oz (77.7 kg)  Height: 5\' 2"  (1.575 m)   Body mass index is 31.35 kg/m. Physical Exam Constitutional:      Appearance: Normal appearance.  HENT:     Head: Normocephalic and atraumatic.     Mouth/Throat:     Mouth: Mucous membranes are moist.  Eyes:     Conjunctiva/sclera: Conjunctivae normal.  Cardiovascular:     Rate and Rhythm: Normal rate and regular rhythm.     Pulses: Normal pulses.  Pulmonary:     Effort: Pulmonary effort is normal.     Breath sounds: Normal breath sounds.  Abdominal:     Palpations: Abdomen is soft.  Musculoskeletal:        General: Normal range of motion.     Cervical back: Normal range of motion.     Right lower leg: No edema.     Left lower leg: No edema.  Skin:    General: Skin is warm and dry.  Neurological:     Mental Status: She is alert.  Psychiatric:        Mood and Affect: Mood normal.        Behavior: Behavior normal.        Thought Content: Thought content normal.        Judgment: Judgment normal.    Labs reviewed: Basic Metabolic Panel: Recent Labs    07/29/20 1621 08/21/20 1056  NA  --  142  K  --  4.3  CL  --  106  CO2  --  27  GLUCOSE  --  89  BUN  --  19  CREATININE  --  0.91  CALCIUM  --  9.2  TSH 0.70 1.07   Liver Function Tests: Recent Labs    08/21/20 1056  AST 17  ALT 13  BILITOT 0.5  PROT 7.4   No results for input(s): LIPASE, AMYLASE in the last 8760 hours. No results for input(s): AMMONIA in the last 8760 hours. CBC: Recent Labs    07/29/20 1621 08/21/20 1056  WBC 5.8 6.1  NEUTROABS 3,880 4,532  HGB 13.3 13.1  HCT 40.8 41.0  MCV 89.5 91.5  PLT 270 273   Lipid Panel: Recent Labs    08/21/20 1056  CHOL  160  HDL 58  LDLCALC 87  TRIG 64  CHOLHDL 2.8   Lab Results  Component Value Date   HGBA1C 5.9 (H) 08/21/2020    Procedures since last visit: No results found.  Assessment/Plan  1. Post-viral cough syndrome - ipratropium (ATROVENT HFA) 17  MCG/ACT inhaler; Inhale 2 puffs into the lungs every 6 (six) hours as needed for wheezing.  Dispense: 1 each; Refill: 0 -  continue Robitussin DM  PRN  2.  Influenza A -  completed Tamiflu X 5 days -  resolved   Labs/tests ordered:  None   Next appt:  02/19/2021

## 2020-11-14 ENCOUNTER — Other Ambulatory Visit: Payer: Medicare Other

## 2020-11-22 IMAGING — DX DG CHEST 2V
2 series · 2 of 2 positions shown · non-contrast
Comparison: None.

CLINICAL DATA: Cough and wheezing for 2 weeks.

EXAM:
CHEST - 2 VIEW

[dg chest 2 view (1 of 2)]
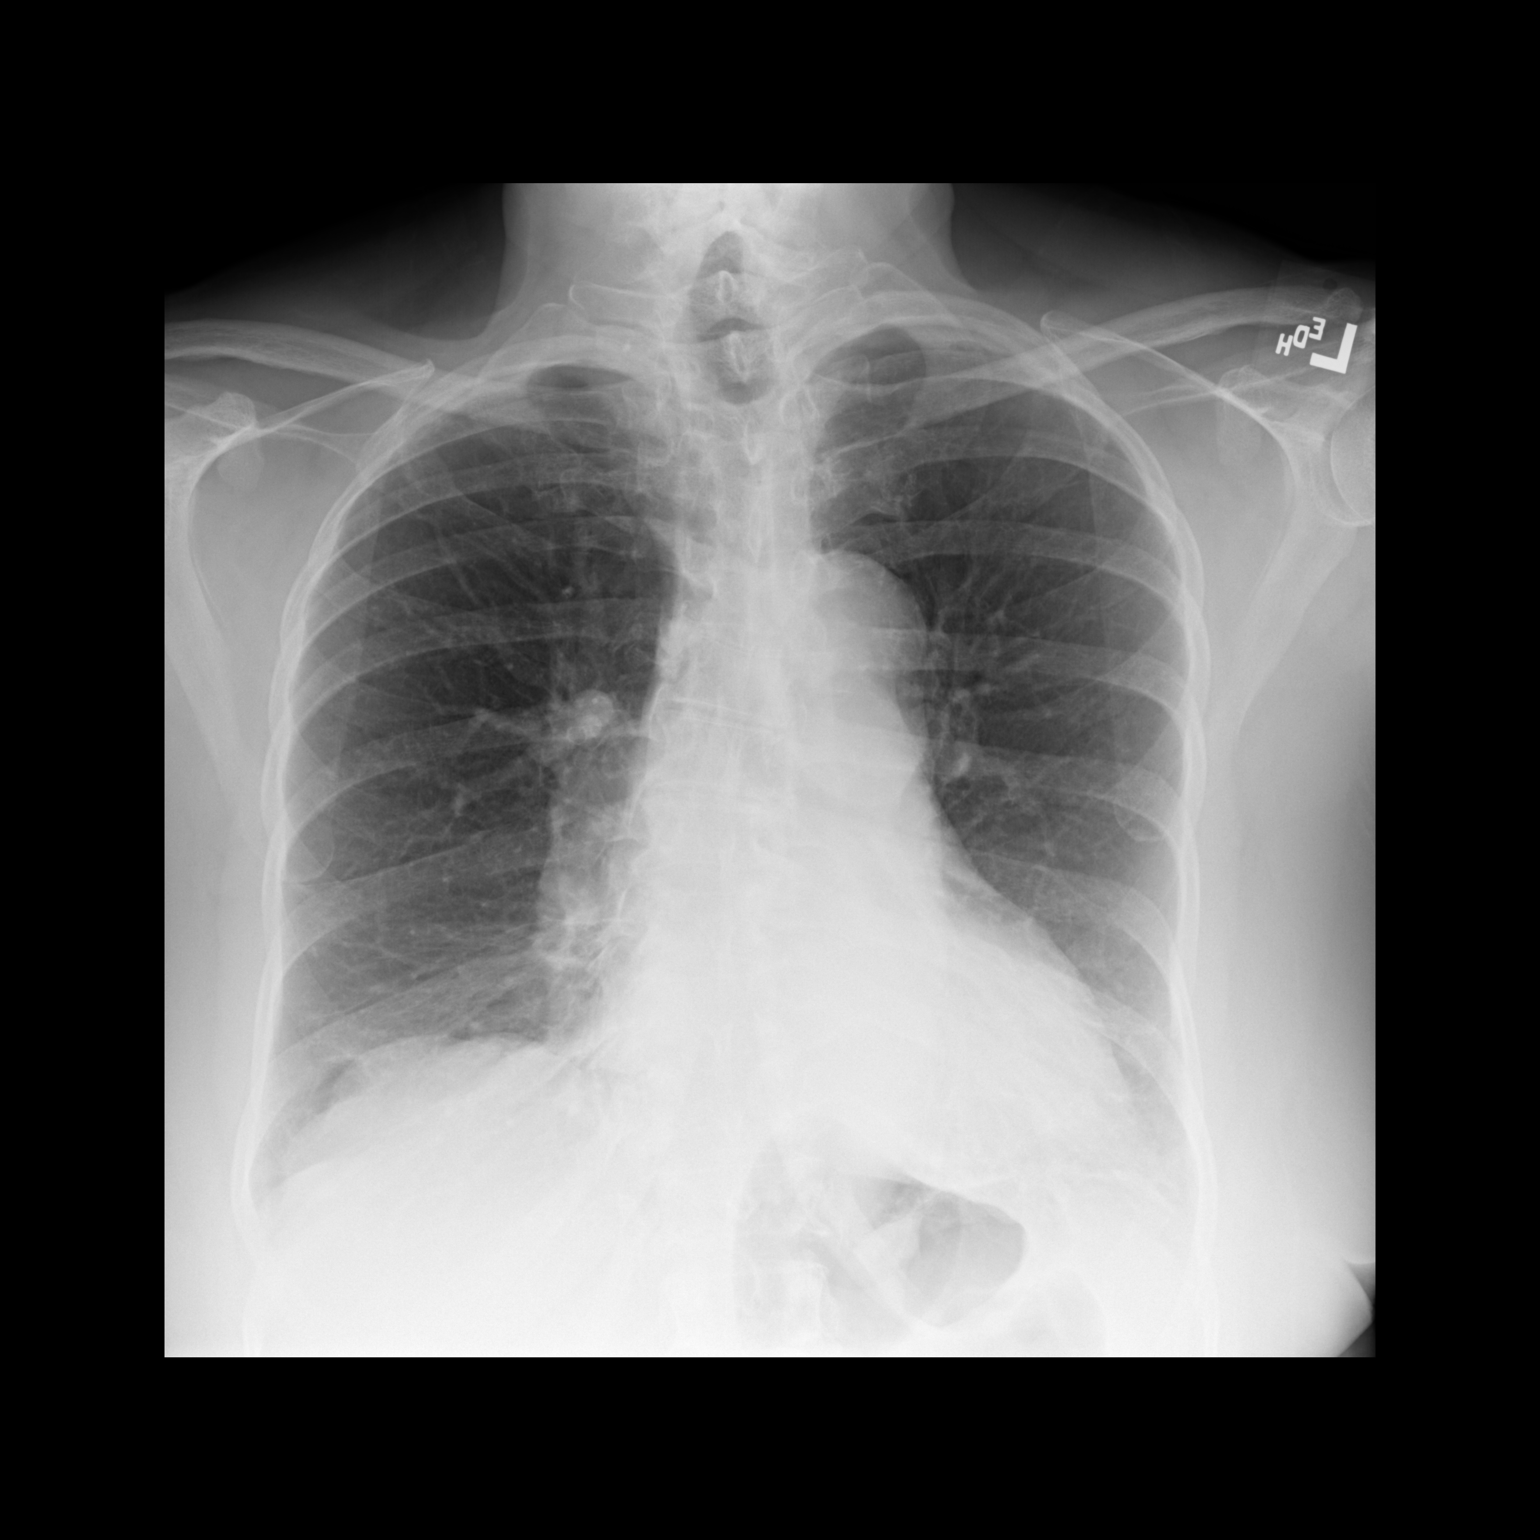

[dg chest 2 view (2 of 2)]
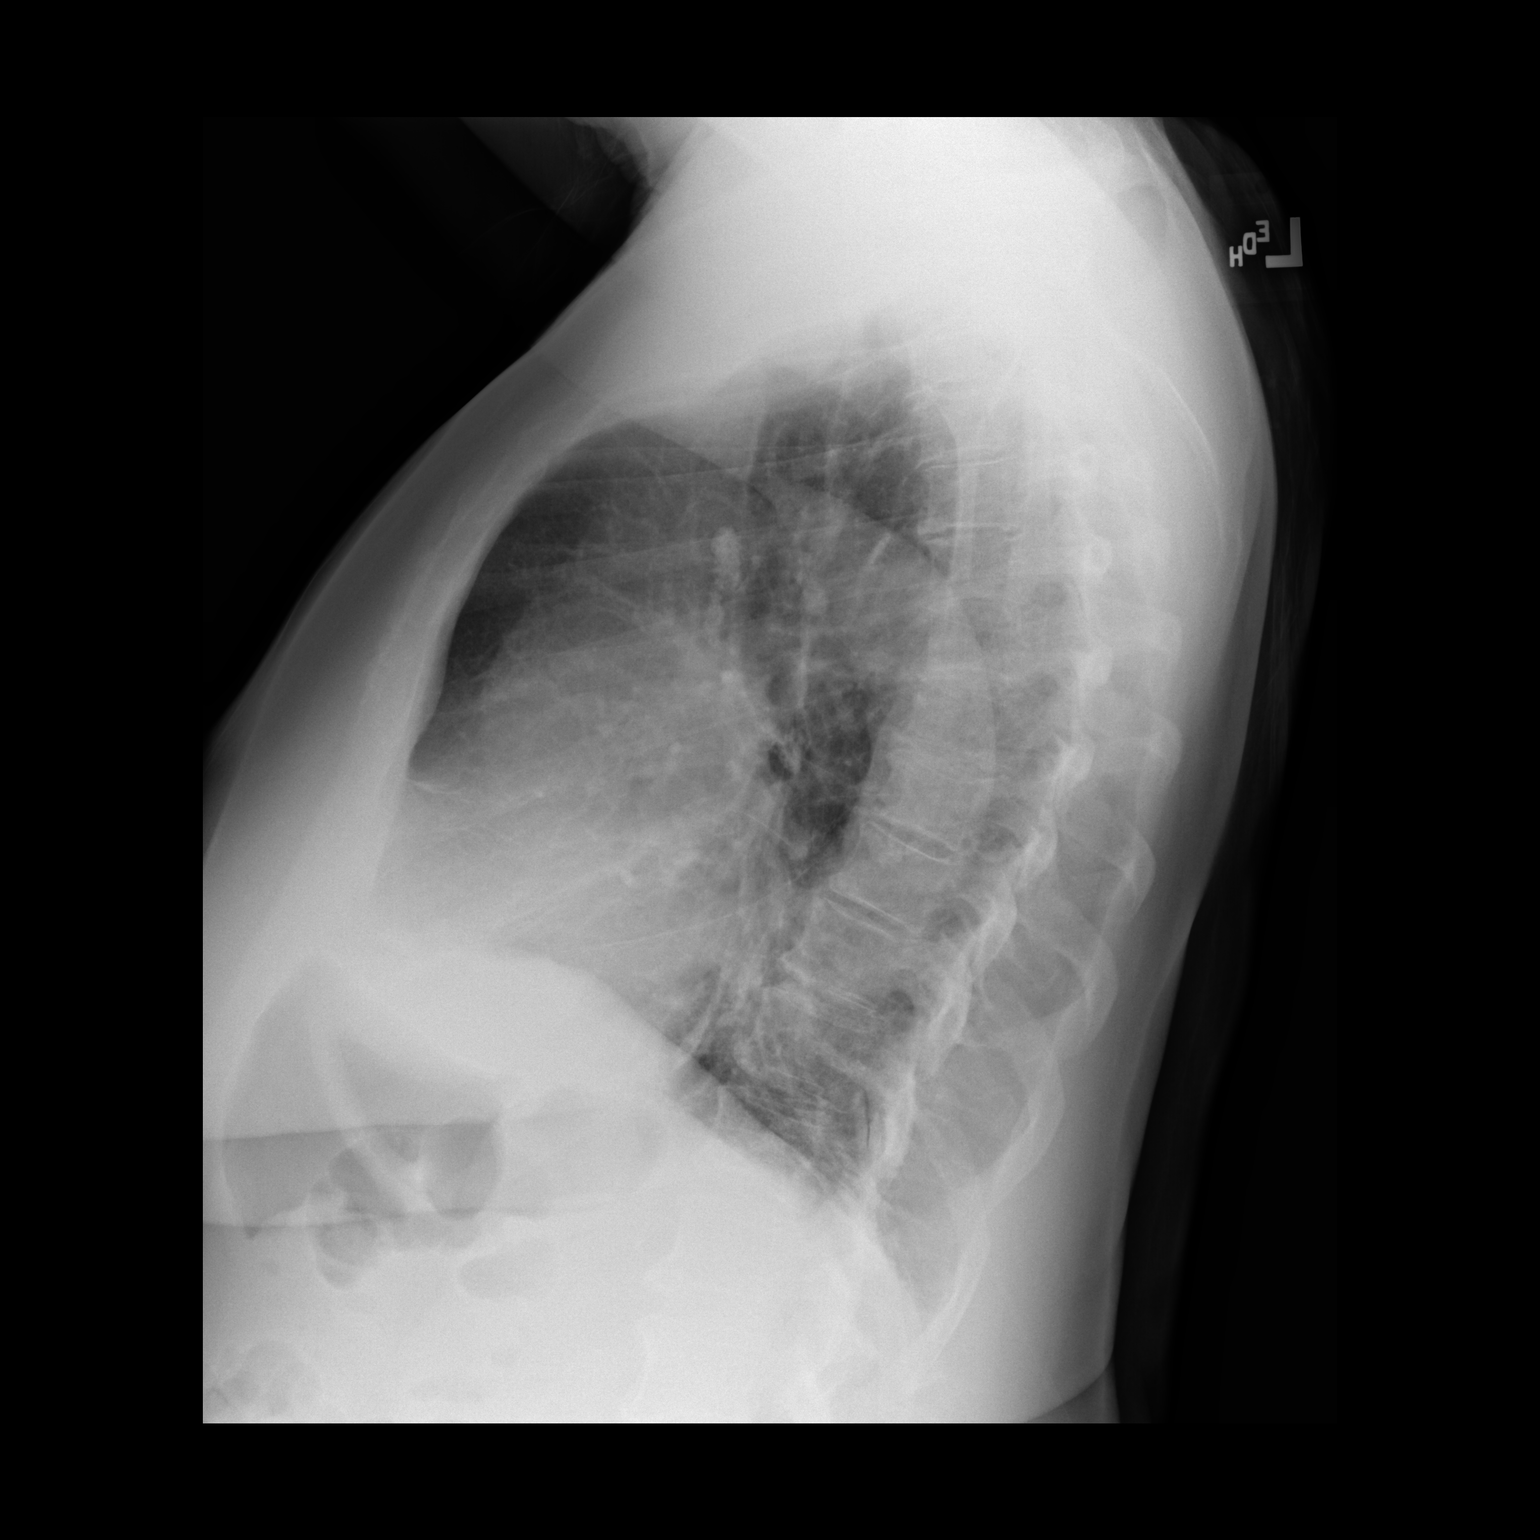

[2 of 2 positions shown; findings below may reference images not displayed]

FINDINGS: Patient is slightly rotated to the RIGHT.

Cardiomegaly noted.

Mild fullness of the RIGHT hilar region is most likely technical
given rotation.

Mild peribronchial thickening identified.

There is no evidence of focal airspace disease, pulmonary edema,
suspicious pulmonary nodule/mass, pleural effusion, or pneumothorax.

No acute bony abnormalities are identified.
IMPRESSION: Cardiomegaly without definite evidence of acute cardiopulmonary
disease.

Mild peribronchial thickening-likely chronic.

## 2020-12-29 ENCOUNTER — Other Ambulatory Visit: Payer: Self-pay | Admitting: Adult Health

## 2020-12-29 DIAGNOSIS — R058 Other specified cough: Secondary | ICD-10-CM

## 2021-01-14 ENCOUNTER — Other Ambulatory Visit: Payer: Self-pay | Admitting: Family

## 2021-01-14 ENCOUNTER — Other Ambulatory Visit: Payer: Self-pay | Admitting: *Deleted

## 2021-01-14 DIAGNOSIS — I10 Essential (primary) hypertension: Secondary | ICD-10-CM

## 2021-01-14 DIAGNOSIS — E785 Hyperlipidemia, unspecified: Secondary | ICD-10-CM

## 2021-01-14 MED ORDER — SIMVASTATIN 20 MG PO TABS: 20.0000 mg | ORAL_TABLET | Freq: Every day | ORAL | 1 refills | Status: DC

## 2021-01-14 NOTE — Telephone Encounter (Signed)
[facility] requested refill.  °

## 2021-01-30 ENCOUNTER — Other Ambulatory Visit: Payer: Self-pay | Admitting: Family

## 2021-01-30 DIAGNOSIS — W57XXXA Bitten or stung by nonvenomous insect and other nonvenomous arthropods, initial encounter: Secondary | ICD-10-CM

## 2021-02-19 ENCOUNTER — Other Ambulatory Visit: Payer: Self-pay

## 2021-02-21 ENCOUNTER — Other Ambulatory Visit: Payer: Self-pay

## 2021-02-21 ENCOUNTER — Encounter: Payer: Self-pay | Admitting: Family

## 2021-02-21 ENCOUNTER — Ambulatory Visit (INDEPENDENT_AMBULATORY_CARE_PROVIDER_SITE_OTHER): Payer: Medicare Other | Admitting: Family

## 2021-02-21 VITALS — BP 140/88 | HR 75 | Temp 97.2°F | Resp 16 | Ht 62.0 in | Wt 173.8 lb

## 2021-02-21 DIAGNOSIS — R7303 Prediabetes: Secondary | ICD-10-CM | POA: Diagnosis not present

## 2021-02-21 DIAGNOSIS — I10 Essential (primary) hypertension: Secondary | ICD-10-CM | POA: Diagnosis not present

## 2021-02-21 DIAGNOSIS — E0500 Thyrotoxicosis with diffuse goiter without thyrotoxic crisis or storm: Secondary | ICD-10-CM | POA: Diagnosis not present

## 2021-02-21 DIAGNOSIS — R058 Other specified cough: Secondary | ICD-10-CM

## 2021-02-21 DIAGNOSIS — E785 Hyperlipidemia, unspecified: Secondary | ICD-10-CM

## 2021-02-21 DIAGNOSIS — J302 Other seasonal allergic rhinitis: Secondary | ICD-10-CM

## 2021-02-21 DIAGNOSIS — L309 Dermatitis, unspecified: Secondary | ICD-10-CM

## 2021-02-21 MED ORDER — BENZONATATE 100 MG PO CAPS: 100.0000 mg | ORAL_CAPSULE | Freq: Three times a day (TID) | ORAL | 0 refills | Status: DC

## 2021-02-21 MED ORDER — MOMETASONE FUROATE 0.1 % EX CREA: TOPICAL_CREAM | CUTANEOUS | 1 refills | Status: DC

## 2021-02-21 MED ORDER — BETAMETHASONE DIPROPIONATE AUG 0.05 % EX OINT: 1.0000 "application " | TOPICAL_OINTMENT | Freq: Every day | CUTANEOUS | 3 refills | Status: AC

## 2021-02-21 NOTE — Patient Instructions (Signed)
-   please get your Shingles and 5 th COVID-19 booster

## 2021-02-21 NOTE — Progress Notes (Signed)
Provider: Marlowe Sax FNP-C   Jastin Fore, Nelda Bucks, NP  Patient Care Team: Leah Skora, Nelda Bucks, NP as PCP - General (Family Medicine) Harriett Sine, MD as Consulting Physician (Dermatology)  Extended Emergency Contact Information Primary Emergency Contact: [name] [place] Mobile Phone: [phone] Relation: Daughter Secondary Emergency Contact: booker,melonie Mobile Phone: [phone] Relation: Granddaughter  Code Status:  Full Code  Goals of care: Advanced Directive information Advanced Directives 02/21/2021  Does Patient Have a Medical Advance Directive? No  Type of Advance Directive -  Does patient want to make changes to medical advance directive? -  Copy of [place] in Chart? -  Would patient like information on creating a medical advance directive? No - Patient declined     Chief Complaint  Patient presents with   Medical Management of Chronic Issues    6 month follow up.   Immunizations    Discuss the need for Shingrix vaccine, Pne vaccine, and Covid Booster.    HPI:  Pt is a 86 y.o. female seen today for 6 months follow up for medical management of chronic diseases.   Hypertension - B/p elevated today.no home readings.on atenolol and Olmesartan.denies any headache,dizziness,vision changes,fatigue,chest tightness,palpitation,chest pain or shortness of breath.     Constipation - miralax effective.sometimes includes veggies.  Allergies - has been using mucinex DM as needed with some relief.    Health Maintenance :  Shingles vaccine and COVID-19 booster -- aware to get vaccine at the pharmacy  Has had 2 lbs weight gain since last visit though thinks dye to heavy clothing layers due to the cold weather. No edema.     Past Medical History:  Diagnosis Date   Eczema    Per [place] new patient packet   Graves disease    Per [place] new patient packet   High blood pressure    Per [facility] new patient packet   Hypertension     Past Surgical History:  Procedure Laterality Date   TOTAL ABDOMINAL HYSTERECTOMY Bilateral 1970's   TUBAL LIGATION      Allergies  Allergen Reactions   Codeine Nausea Only    unknown   Tramadol Nausea Only    Allergies as of 02/21/2021       Reactions   Codeine Nausea Only   unknown   Tramadol Nausea Only        Medication List        Accurate as of February 21, 2021 11:59 PM. If you have any questions, ask your nurse or doctor.          acetaminophen 650 MG CR tablet Commonly known as: TYLENOL Take 650 mg by mouth every 8 (eight) hours as needed for pain.   albuterol 108 (90 Base) MCG/ACT inhaler Commonly known as: VENTOLIN HFA Inhale 2 puffs into the lungs every 4 (four) hours as needed for wheezing or shortness of breath. Please educate how to use   aspirin EC 81 MG tablet Take 81 mg by mouth daily. Swallow whole.   atenolol 50 MG tablet Commonly known as: TENORMIN TAKE 1 TABLET(50 MG) BY MOUTH DAILY   Atrovent HFA 17 MCG/ACT inhaler Generic drug: ipratropium INHALE 2 PUFFS INTO THE LUNGS EVERY 6 HOURS AS NEEDED FOR WHEEZING   augmented betamethasone dipropionate 0.05 % ointment Commonly known as: DIPROLENE-AF Apply 1 application topically daily.   benzonatate 100 MG capsule Commonly known as: TESSALON Take 1 capsule (100 mg total) by mouth 3 (three) times daily.   cetirizine  10 MG tablet Commonly known as: ZYRTEC Take 1 tablet (10 mg total) by mouth daily.   EPINEPHrine 0.3 mg/0.3 mL Soaj injection Commonly known as: EpiPen 2-Pak Inject 0.3 mg into the muscle as needed. Inject 0.51m intramuscular route once as needed.   guaiFENesin-dextromethorphan 100-10 MG/5ML syrup Commonly known as: ROBITUSSIN DM Take 5 mLs by mouth every 4 (four) hours as needed for cough.   ipratropium 0.03 % nasal spray Commonly known as: ATROVENT USE 2 SPRAYS IN EACH NOSTRIL TWICE DAILY   mometasone 0.1 % cream Commonly known as: ELOCON APPLY 1 APPLICATION  TOPICALLY TO THE AFFETED AREA TWICE DAILY   mupirocin ointment 2 % Commonly known as: BACTROBAN Apply 1 application topically 2 (two) times daily.   olmesartan 40 MG tablet Commonly known as: BENICAR Take 1 tablet (40 mg total) by mouth daily.   Pfizer-BioNT COVID-19 Vac-TriS Susp injection Generic drug: COVID-19 mRNA Vac-TriS (Pfizer) Inject into the muscle.   polyethylene glycol powder 17 GM/SCOOP powder Commonly known as: GLYCOLAX/MIRALAX Take 17 g by mouth 2 (two) times daily as needed.   simvastatin 20 MG tablet Commonly known as: ZOCOR Take 1 tablet (20 mg total) by mouth daily.        Review of Systems  Constitutional:  Negative for appetite change, chills, fatigue, fever and unexpected weight change.  HENT:  Negative for congestion, dental problem, ear discharge, ear pain, facial swelling, hearing loss, nosebleeds, postnasal drip, rhinorrhea, sinus pressure, sinus pain, sneezing, sore throat, tinnitus and trouble swallowing.   Eyes:  Negative for pain, discharge, redness, itching and visual disturbance.  Respiratory:  Negative for cough, chest tightness, shortness of breath and wheezing.   Cardiovascular:  Negative for chest pain, palpitations and leg swelling.  Gastrointestinal:  Negative for abdominal distention, abdominal pain, blood in stool, constipation, diarrhea, nausea and vomiting.  Endocrine: Negative for cold intolerance, heat intolerance, polydipsia, polyphagia and polyuria.  Genitourinary:  Negative for difficulty urinating, dysuria, flank pain, frequency and urgency.  Musculoskeletal:  Negative for arthralgias, back pain, gait problem, joint swelling, myalgias, neck pain and neck stiffness.  Skin:  Negative for color change, pallor, rash and wound.  Neurological:  Negative for dizziness, syncope, speech difficulty, weakness, light-headedness, numbness and headaches.  Hematological:  Does not bruise/bleed easily.  Psychiatric/Behavioral:  Negative for  agitation, behavioral problems, confusion, hallucinations, self-injury, sleep disturbance and suicidal ideas. The patient is not nervous/anxious.    Immunization History  Administered Date(s) Administered   Fluad Quad(high Dose 65+) 10/11/2018   Influenza, High Dose Seasonal PF 12/03/2015, 11/07/2019   Influenza,inj,Quad PF,6+ Mos 10/11/2018   Influenza-Unspecified 11/16/2017, 11/27/2020   PFIZER Comirnaty(Gray Top)Covid-19 Tri-Sucrose Vaccine 07/31/2020   PFIZER(Purple Top)SARS-COV-2 Vaccination 02/18/2019, 03/11/2019, 11/29/2019, 07/31/2020   Pneumococcal Conjugate-13 10/11/2018   Td 10/11/2018   Tdap 10/11/2018   Pertinent  Health Maintenance Due  Topic Date Due   INFLUENZA VACCINE  Completed   DEXA SCAN  Completed   Fall Risk 04/11/2020 07/29/2020 08/07/2020 08/21/2020 02/21/2021  Falls in the past year? 1 0 0 0 0  Was there an injury with Fall? 0 0 0 0 0  Fall Risk Category Calculator 1 0 0 0 0  Fall Risk Category Low Low Low Low Low  Patient Fall Risk Level Low fall risk Low fall risk Low fall risk Low fall risk Low fall risk  Patient at Risk for Falls Due to - No Fall Risks No Fall Risks No Fall Risks No Fall Risks  Fall risk Follow up - - Falls  evaluation completed Falls evaluation completed Falls evaluation completed   Functional Status Survey:    Vitals:   02/21/21 0940 02/21/21 1033  BP: (!) 160/90 140/88  Pulse: 75   Resp: 16   Temp: (!) 97.2 F (36.2 C)   SpO2: 93%   Weight: 173 lb 12.8 oz (78.8 kg)   Height: 5' 2"  (1.575 m)    Body mass index is 31.79 kg/m. Physical Exam Vitals reviewed.  Constitutional:      General: She is not in acute distress.    Appearance: Normal appearance. She is normal weight. She is not ill-appearing or diaphoretic.  HENT:     Head: Normocephalic.     Right Ear: Tympanic membrane, ear canal and external ear normal. There is no impacted cerumen.     Left Ear: Tympanic membrane, ear canal and external ear normal. There is no  impacted cerumen.     Nose: Nose normal. No congestion or rhinorrhea.     Mouth/Throat:     Mouth: Mucous membranes are moist.     Pharynx: Oropharynx is clear. No oropharyngeal exudate or posterior oropharyngeal erythema.  Eyes:     General: No scleral icterus.       Right eye: No discharge.        Left eye: No discharge.     Extraocular Movements: Extraocular movements intact.     Conjunctiva/sclera: Conjunctivae normal.     Pupils: Pupils are equal, round, and reactive to light.  Neck:     Vascular: No carotid bruit.  Cardiovascular:     Rate and Rhythm: Normal rate and regular rhythm.     Pulses: Normal pulses.     Heart sounds: Normal heart sounds. No murmur heard.   No friction rub. No gallop.  Pulmonary:     Effort: Pulmonary effort is normal. No respiratory distress.     Breath sounds: Normal breath sounds. No wheezing, rhonchi or rales.  Chest:     Chest wall: No tenderness.  Abdominal:     General: Bowel sounds are normal. There is no distension.     Palpations: Abdomen is soft. There is no mass.     Tenderness: There is no abdominal tenderness. There is no right CVA tenderness, left CVA tenderness, guarding or rebound.  Musculoskeletal:        General: No swelling or tenderness. Normal range of motion.     Cervical back: Normal range of motion. No rigidity or tenderness.     Right lower leg: No edema.     Left lower leg: No edema.  Lymphadenopathy:     Cervical: No cervical adenopathy.  Skin:    General: Skin is warm and dry.     Coloration: Skin is not pale.     Findings: No bruising, erythema, lesion or rash.  Neurological:     Mental Status: She is alert and oriented to person, place, and time.     Cranial Nerves: No cranial nerve deficit.     Sensory: No sensory deficit.     Motor: No weakness.     Coordination: Coordination normal.     Gait: Gait normal.  Psychiatric:        Mood and Affect: Mood normal.        Speech: Speech normal.        Behavior:  Behavior normal.        Thought Content: Thought content normal.        Judgment: Judgment normal.    Labs reviewed:  Recent Labs    08/21/20 1056 02/21/21 1034  NA 142 142  K 4.3 4.5  CL 106 106  CO2 27 28  GLUCOSE 89 84  BUN 19 18  CREATININE 0.91 0.90  CALCIUM 9.2 9.3   Recent Labs    08/21/20 1056 02/21/21 1034  AST 17 15  ALT 13 11  BILITOT 0.5 0.4  PROT 7.4 7.5   Recent Labs    07/29/20 1621 08/21/20 1056 02/21/21 1034  WBC 5.8 6.1 5.8  NEUTROABS 3,880 4,532 3,845  HGB 13.3 13.1 13.1  HCT 40.8 41.0 40.3  MCV 89.5 91.5 90.6  PLT 270 273 267   Lab Results  Component Value Date   TSH 1.01 02/21/2021   Lab Results  Component Value Date   HGBA1C 6.0 (H) 02/21/2021   Lab Results  Component Value Date   CHOL 159 02/21/2021   HDL 57 02/21/2021   LDLCALC 85 02/21/2021   TRIG 77 02/21/2021   CHOLHDL 2.8 02/21/2021    Significant Diagnostic Results in last 30 days:  No results found.  Assessment/Plan  1. Essential hypertension Elevated on arrival but rechecked improved to baseline. - continue on Olmesartan and Atenolol  - TSH - CMP with eGFR([facility]) - CBC with Differential/Platelets  2. Hyperlipidemia LDL goal <100 LDL 85  - continue simvastatin  - Lipid Panel  3. Prediabetes Lab Results  Component Value Date   HGBA1C 6.0 (H) 02/21/2021  - Dietary modification and exercise at least 3 times per week for 30 minutes advised. - Hemoglobin A1c  4. Seasonal allergies Continue cetrizine   5. Eczema, unspecified type No rash request Mometasone cream refill which has been effective  - mometasone (ELOCON) 0.1 % cream; APPLY 1 APPLICATION TOPICALLY TO THE AFFETED AREA TWICE DAILY  Dispense: 45 g; Refill: 1  7. Post-viral cough syndrome Cough keeping her awake. - benzonatate (TESSALON) 100 MG capsule; Take 1 capsule (100 mg total) by mouth 3 (three) times daily.  Dispense: 30 capsule; Refill: 0  8. Graves disease Lab Results  Component Value  Date   TSH 1.01 02/21/2021   - TSH  Family/ staff Communication: Reviewed plan of care with patient and Daughter verbalized understanding   Labs/tests ordered:  Has labs orders in place   Next Appointment : 6 months for medical management of chronic issues.Fasting Labs prior to visit.    Sandrea Hughs, NP

## 2021-02-22 LAB — LIPID PANEL
Cholesterol: 159 mg/dL (ref <200)
HDL: 57 mg/dL (ref >=50)
LDL Cholesterol (Calc): 85 mg/dL (calc)
Non-HDL Cholesterol (Calc): 102 mg/dL (calc) (ref <130)
Total CHOL/HDL Ratio: 2.8 (calc) (ref <5.0)
Triglycerides: 77 mg/dL (ref <150)

## 2021-02-22 LAB — COMPLETE METABOLIC PANEL WITH GFR
AG Ratio: 1.1 (calc) (ref 1.0–2.5)
ALT: 11 U/L (ref 6–29)
AST: 15 U/L (ref 10–35)
Albumin: 3.9 g/dL (ref 3.6–5.1)
Alkaline phosphatase (APISO): 102 U/L (ref 37–153)
BUN: 18 mg/dL (ref 7–25)
CO2: 28 mmol/L (ref 20–32)
Calcium: 9.3 mg/dL (ref 8.6–10.4)
Chloride: 106 mmol/L (ref 98–110)
Creat: 0.9 mg/dL (ref 0.60–0.95)
Globulin: 3.6 g/dL (calc) (ref 1.9–3.7)
Glucose, Bld: 84 mg/dL (ref 65–99)
Potassium: 4.5 mmol/L (ref 3.5–5.3)
Sodium: 142 mmol/L (ref 135–146)
Total Bilirubin: 0.4 mg/dL (ref 0.2–1.2)
Total Protein: 7.5 g/dL (ref 6.1–8.1)
eGFR: 63 mL/min/{1.73_m2} (ref >=60)

## 2021-02-22 LAB — CBC WITH DIFFERENTIAL/PLATELET
Absolute Monocytes: 412 cells/uL (ref 200–950)
Basophils Absolute: 29 cells/uL (ref 0–200)
Basophils Relative: 0.5 %
Eosinophils Absolute: 168 cells/uL (ref 15–500)
Eosinophils Relative: 2.9 %
HCT: 40.3 % (ref 35.0–45.0)
Hemoglobin: 13.1 g/dL (ref 11.7–15.5)
Lymphs Abs: 1346 cells/uL (ref 850–3900)
MCH: 29.4 pg (ref 27.0–33.0)
MCHC: 32.5 g/dL (ref 32.0–36.0)
MCV: 90.6 fL (ref 80.0–100.0)
MPV: 9.8 fL (ref 7.5–12.5)
Monocytes Relative: 7.1 %
Neutro Abs: 3845 cells/uL (ref 1500–7800)
Neutrophils Relative %: 66.3 %
Platelets: 267 10*3/uL (ref 140–400)
RBC: 4.45 10*6/uL (ref 3.80–5.10)
RDW: 12.7 % (ref 11.0–15.0)
Total Lymphocyte: 23.2 %
WBC: 5.8 10*3/uL (ref 3.8–10.8)

## 2021-02-22 LAB — HEMOGLOBIN A1C
Hgb A1c MFr Bld: 6 % of total Hgb — ABNORMAL HIGH (ref <5.7)
Mean Plasma Glucose: 126 mg/dL
eAG (mmol/L): 7 mmol/L

## 2021-02-22 LAB — TSH: TSH: 1.01 mIU/L (ref 0.40–4.50)

## 2021-04-09 ENCOUNTER — Telehealth: Payer: Self-pay

## 2021-04-09 NOTE — Telephone Encounter (Signed)
Message left on clinical intake voicemail:  ? ?Patient called c/o SOB when walking up stairs and questions if something can be prescribed for her. ? ?Call was returned to patient, no answer, and no voicemail. Patient will need to schedule an appointment to be evalauated. ? ?We will need to try to call patient again. I will also send patient a mychart message as another avenue to communication  ?

## 2021-04-16 ENCOUNTER — Other Ambulatory Visit: Payer: Self-pay | Admitting: Family

## 2021-04-16 DIAGNOSIS — I10 Essential (primary) hypertension: Secondary | ICD-10-CM

## 2021-04-22 NOTE — Telephone Encounter (Signed)
Patient daughter, Judeen Hammans called and scheduled an appointment for 04/23/2021 with [facility].  ?

## 2021-04-23 ENCOUNTER — Ambulatory Visit (INDEPENDENT_AMBULATORY_CARE_PROVIDER_SITE_OTHER): Payer: Medicare Other | Admitting: Adult Health

## 2021-04-23 ENCOUNTER — Ambulatory Visit: Payer: Medicare Other | Admitting: Family

## 2021-04-23 ENCOUNTER — Encounter: Payer: Self-pay | Admitting: Adult Health

## 2021-04-23 ENCOUNTER — Other Ambulatory Visit: Payer: Self-pay

## 2021-04-23 ENCOUNTER — Other Ambulatory Visit: Payer: Self-pay | Admitting: Family

## 2021-04-23 VITALS — BP 150/90 | HR 63 | Temp 98.0°F | Ht 62.0 in | Wt 172.0 lb

## 2021-04-23 DIAGNOSIS — J301 Allergic rhinitis due to pollen: Secondary | ICD-10-CM

## 2021-04-23 DIAGNOSIS — I4891 Unspecified atrial fibrillation: Secondary | ICD-10-CM

## 2021-04-23 DIAGNOSIS — R059 Cough, unspecified: Secondary | ICD-10-CM

## 2021-04-23 DIAGNOSIS — I491 Atrial premature depolarization: Secondary | ICD-10-CM | POA: Diagnosis not present

## 2021-04-23 NOTE — Progress Notes (Signed)
? ?Location:  [facility] ?  ?Place of Service:   clinic  ? ? ?CODE STATUS: full  ? ? ?Allergies  ?Allergen Reactions  ? Codeine Nausea Only  ?  unknown  ? Tramadol Nausea Only  ? ? ?Chief Complaint  ?Patient presents with  ? Acute Visit  ?  Patient complains of SOB for about 2 or 3 months. While walking patient gets short of breath. Patient has some coughing. Patient has dry cough. Patient takes mucinex Dm and ricola for cough.  ? ? ?HPI: ? ?For the past couple of months she has noted an increased shortness of breath with activity. She does feel her heart palpating as well. It takes several minutes for the shortness of breath and palpating to go away. She denies any chest pain; no nausea or vomiting present. The palpations do not happen at rest.  ? ?Past Medical History:  ?Diagnosis Date  ? Eczema   ? Per [facility] new patient packet  ? Graves disease   ? Per [facility] new patient packet  ? High blood pressure   ? Per [facility] new patient packet  ? Hypertension   ? ? ?Past Surgical History:  ?Procedure Laterality Date  ? TOTAL ABDOMINAL HYSTERECTOMY Bilateral 1970's  ? TUBAL LIGATION    ? ? ?Social History  ? ?Socioeconomic History  ? Marital status: Widowed  ?  Spouse name: Not on file  ? Number of children: Not on file  ? Years of education: Not on file  ? Highest education level: Not on file  ?Occupational History  ? Not on file  ?Tobacco Use  ? Smoking status: Former  ? Smokeless tobacco: Never  ? Tobacco comments:  ?  Smoked for 6 years, Quit at age 50  ?Vaping Use  ? Vaping Use: Never used  ?Substance and Sexual Activity  ? Alcohol use: Never  ? Drug use: Never  ? Sexual activity: Not Currently  ?Other Topics Concern  ? Not on file  ?Social History Narrative  ? Diet  ?   ? Do you drink/eat things with caffeine: Yes  ?   ? Marital Status: Widowed  What year were you married? 1972  ?   ? Do you live in a house, apartment, assisted living, condo, trailer, etc.? Townhouse  ?   ? Is it one or more stories? 2  ?   ? How  many persons live in your home? 3     ?   ? Do you have any pets in your home?(please list): Yes, dog and fish  ?   ? Highest level of education completed: High School  ?   ? Current or past profession: Day care owner, foster parent  ?   ? Do you exercise?: sometimes Type and how often: Walking  ?   ? Living Will? No  ?   ? DNR form? No  If not, do you wish to discuss one?: Yes  ?   ? POA/HPOA forms? No  ?   ? Difficulty bathing or dressing yourself? No  ?   ? Difficulty preparing food or eating? No  ?   ? Difficulty managing medications? No  ?   ? Difficulty managing your finances? No  ?   ? Difficulty affording your medications? No  ?   ?   ?   ?   ?   ?   ? ?Social Determinants of Health  ? ?Financial Resource Strain: Not on file  ?Food  Insecurity: Not on file  ?Transportation Needs: Not on file  ?Physical Activity: Not on file  ?Stress: Not on file  ?Social Connections: Not on file  ?Intimate Partner Violence: Not on file  ? ?Family History  ?Problem Relation Age of Onset  ? Tuberculosis Mother   ? Hypertension Father   ? ? ? ? ?VITAL SIGNS ?BP (!) 150/90   Pulse 63   Temp 98 ?F (36.7 ?C)   Ht 5\' 2"  (1.575 m)   Wt 172 lb (78 kg)   SpO2 97%   BMI 31.46 kg/m?  ? ?Outpatient Encounter Medications as of 04/23/2021  ?Medication Sig  ? acetaminophen (TYLENOL) 650 MG CR tablet Take 650 mg by mouth every 8 (eight) hours as needed for pain.  ? albuterol (VENTOLIN HFA) 108 (90 Base) MCG/ACT inhaler Inhale 2 puffs into the lungs every 4 (four) hours as needed for wheezing or shortness of breath. Please educate how to use  ? aspirin EC 81 MG tablet Take 81 mg by mouth daily. Swallow whole.  ? atenolol (TENORMIN) 50 MG tablet TAKE 1 TABLET(50 MG) BY MOUTH DAILY  ? cetirizine (ZYRTEC) 10 MG tablet Take 1 tablet (10 mg total) by mouth daily.  ? EPINEPHrine (EPIPEN 2-PAK) 0.3 mg/0.3 mL IJ SOAJ injection Inject 0.3 mg into the muscle as needed. Inject 0.3mg  intramuscular route once as needed.  ? ipratropium (ATROVENT HFA)  17 MCG/ACT inhaler INHALE 2 PUFFS INTO THE LUNGS EVERY 6 HOURS AS NEEDED FOR WHEEZING  ? ipratropium (ATROVENT) 0.03 % nasal spray USE 2 SPRAYS IN EACH NOSTRIL TWICE DAILY  ? mometasone (ELOCON) 0.1 % cream APPLY 1 APPLICATION TOPICALLY TO THE AFFETED AREA TWICE DAILY  ? mupirocin ointment (BACTROBAN) 2 % Apply 1 application topically 2 (two) times daily.  ? olmesartan (BENICAR) 40 MG tablet TAKE 1 TABLET(40 MG) BY MOUTH DAILY  ? simvastatin (ZOCOR) 20 MG tablet Take 1 tablet (20 mg total) by mouth daily.  ? benzonatate (TESSALON) 100 MG capsule Take 1 capsule (100 mg total) by mouth 3 (three) times daily. (Patient not taking: Reported on 04/23/2021)  ? guaiFENesin-dextromethorphan (ROBITUSSIN DM) 100-10 MG/5ML syrup Take 5 mLs by mouth every 4 (four) hours as needed for cough. (Patient not taking: Reported on 04/23/2021)  ? polyethylene glycol powder (GLYCOLAX/MIRALAX) 17 GM/SCOOP powder Take 17 g by mouth 2 (two) times daily as needed. (Patient not taking: Reported on 04/23/2021)  ? [DISCONTINUED] COVID-19 mRNA Vac-TriS, Pfizer, (PFIZER-BIONT COVID-19 VAC-TRIS) SUSP injection Inject into the muscle.  ? ?No facility-administered encounter medications on file as of 04/23/2021.  ? ? ? ?SIGNIFICANT DIAGNOSTIC EXAMS ? ?Review of Systems  ?Constitutional:  Negative for malaise/fatigue.  ?Respiratory:  Positive for shortness of breath. Negative for cough and wheezing.   ?Cardiovascular:  Negative for chest pain, palpitations and leg swelling.  ?     Palpation present   ?Gastrointestinal:  Negative for abdominal pain, constipation and heartburn.  ?Musculoskeletal:  Negative for back pain, joint pain and myalgias.  ?Skin: Negative.   ?Neurological:  Negative for dizziness.  ?Psychiatric/Behavioral:  The patient is not nervous/anxious.   ? ? ?Physical Exam ?Constitutional:   ?   General: She is not in acute distress. ?   Appearance: She is well-developed. She is not diaphoretic.  ?Neck:  ?   Thyroid: No thyromegaly.   ?Cardiovascular:  ?   Rate and Rhythm: Normal rate. Rhythm irregular.  ?   Pulses: Normal pulses.  ?   Heart sounds: Normal heart sounds.  ?Pulmonary:  ?  Effort: Pulmonary effort is normal. No respiratory distress.  ?   Breath sounds: Normal breath sounds.  ?Abdominal:  ?   General: Bowel sounds are normal. There is no distension.  ?   Palpations: Abdomen is soft.  ?   Tenderness: There is no abdominal tenderness.  ?Musculoskeletal:     ?   General: Normal range of motion.  ?   Cervical back: Neck supple.  ?   Right lower leg: No edema.  ?   Left lower leg: No edema.  ?Lymphadenopathy:  ?   Cervical: No cervical adenopathy.  ?Skin: ?   General: Skin is warm and dry.  ?Neurological:  ?   Mental Status: She is alert and oriented to person, place, and time.  ?Psychiatric:     ?   Mood and Affect: Mood normal.  ? ? ? ? ?ASSESSMENT/ PLAN: ? ?TODAY:  ? ?PAC: she is symptomatic with shortness of breath and with palpations present. Her EKG demonstrates PAC present; with sinus rhythm. Will set up a cardiologist.  ? ? ?Synthia Innocenteborah Talia Hoheisel NP ?[facility]  ?call [phone]  ? ?

## 2021-05-01 ENCOUNTER — Other Ambulatory Visit: Payer: Self-pay

## 2021-05-01 ENCOUNTER — Ambulatory Visit (INDEPENDENT_AMBULATORY_CARE_PROVIDER_SITE_OTHER): Payer: Medicare Other | Admitting: Internal Medicine

## 2021-05-01 ENCOUNTER — Encounter: Payer: Self-pay | Admitting: Internal Medicine

## 2021-05-01 VITALS — BP 164/94 | HR 72 | Ht 61.0 in | Wt 171.0 lb

## 2021-05-01 DIAGNOSIS — R0609 Other forms of dyspnea: Secondary | ICD-10-CM | POA: Diagnosis not present

## 2021-05-01 MED ORDER — CARVEDILOL 25 MG PO TABS: 25.0000 mg | ORAL_TABLET | Freq: Two times a day (BID) | ORAL | 3 refills | Status: DC

## 2021-05-01 NOTE — Progress Notes (Signed)
?Cardiology Office Note:   ? ?Date:  05/01/2021  ? ?ID:  Ashley Johns, DOB [birth year], MRN [id number] ? ?PCP:  Ngetich, Donalee Citrin, NP ?  ?[facility] Providers ?Cardiologist:  Maisie Fus, MD    ? ?Referring MD: Sharee Holster, NP  ? ?No chief complaint on file. ?DOE ? ?History of Present Illness:   ? ?Ashley Johns is a 86 y.o. female with a hx of graves dx s/p thyroidecotmy, htn referral from [facility] referral for SOB for 2-3 months ? ?She notes SOB with walking upstairs. She then rests. Just walking to the garbage can she gets SOB. She did note some chest pressure with walking. Last week it was worse. She's had a lot of coughing. Daughter notes she has seasonal allergies. No LE edema. She's lost weight. She denies orthopnea, PND.  Has not seen a cardiologist. No hx of stress test.  No cath. TSH is normal. ? ?Former smoker. She started in her 20s-30s.  ? ?She comes in with her daughter. They are from here. She lived in [place] briefly many years ago ? ?EKG 04/23/2021-NSR,PACs ? ?Past Medical History:  ?Diagnosis Date  ? Eczema   ? Per [facility] new patient packet  ? Graves disease   ? Per [facility] new patient packet  ? High blood pressure   ? Per [facility] new patient packet  ? Hypertension   ? ? ?Past Surgical History:  ?Procedure Laterality Date  ? TOTAL ABDOMINAL HYSTERECTOMY Bilateral 1970's  ? TUBAL LIGATION    ? ? ?Current Medications: ?Current Meds  ?Medication Sig  ? acetaminophen (TYLENOL) 650 MG CR tablet Take 650 mg by mouth every 8 (eight) hours as needed for pain.  ? albuterol (VENTOLIN HFA) 108 (90 Base) MCG/ACT inhaler INHALE 2 PUFFS BY MOUTH INTO THE LUNGS EVERY 4 HOURS AS NEEDED FOR WHEEZING OR SHORTNESS OF BREATH  ? aspirin EC 81 MG tablet Take 81 mg by mouth daily. Swallow whole.  ? atenolol (TENORMIN) 50 MG tablet TAKE 1 TABLET(50 MG) BY MOUTH DAILY  ? cetirizine (ZYRTEC) 10 MG tablet Take 1 tablet (10 mg total) by mouth daily. (Patient taking differently: Take 10 mg by mouth as needed for  allergies or rhinitis.)  ? EPINEPHrine (EPIPEN 2-PAK) 0.3 mg/0.3 mL IJ SOAJ injection Inject 0.3 mg into the muscle as needed. Inject 0.3mg  intramuscular route once as needed.  ? ipratropium (ATROVENT HFA) 17 MCG/ACT inhaler INHALE 2 PUFFS INTO THE LUNGS EVERY 6 HOURS AS NEEDED FOR WHEEZING  ? ipratropium (ATROVENT) 0.03 % nasal spray USE 2 SPRAYS IN EACH NOSTRIL TWICE DAILY  ? mometasone (ELOCON) 0.1 % cream APPLY 1 APPLICATION TOPICALLY TO THE AFFETED AREA TWICE DAILY (Patient taking differently: as needed. APPLY 1 APPLICATION TOPICALLY TO THE AFFETED AREA TWICE DAILY)  ? mupirocin ointment (BACTROBAN) 2 % Apply 1 application topically 2 (two) times daily.  ? olmesartan (BENICAR) 40 MG tablet TAKE 1 TABLET(40 MG) BY MOUTH DAILY  ? polyethylene glycol powder (GLYCOLAX/MIRALAX) 17 GM/SCOOP powder Take 17 g by mouth 2 (two) times daily as needed.  ? simvastatin (ZOCOR) 20 MG tablet Take 1 tablet (20 mg total) by mouth daily.  ?  ? ?Allergies:   Codeine and Tramadol  ? ?Social History  ? ?Socioeconomic History  ? Marital status: Widowed  ?  Spouse name: Not on file  ? Number of children: Not on file  ? Years of education: Not on file  ? Highest education level: Not on file  ?Occupational  History  ? Not on file  ?Tobacco Use  ? Smoking status: Former  ? Smokeless tobacco: Never  ? Tobacco comments:  ?  Smoked for 6 years, Quit at age 89  ?Vaping Use  ? Vaping Use: Never used  ?Substance and Sexual Activity  ? Alcohol use: Never  ? Drug use: Never  ? Sexual activity: Not Currently  ?Other Topics Concern  ? Not on file  ?Social History Narrative  ? Diet  ?   ? Do you drink/eat things with caffeine: Yes  ?   ? Marital Status: Widowed  What year were you married? 1972  ?   ? Do you live in a house, apartment, assisted living, condo, trailer, etc.? Townhouse  ?   ? Is it one or more stories? 2  ?   ? How many persons live in your home? 3     ?   ? Do you have any pets in your home?(please list): Yes, dog and fish  ?   ?  Highest level of education completed: High School  ?   ? Current or past profession: Day care owner, foster parent  ?   ? Do you exercise?: sometimes Type and how often: Walking  ?   ? Living Will? No  ?   ? DNR form? No  If not, do you wish to discuss one?: Yes  ?   ? POA/HPOA forms? No  ?   ? Difficulty bathing or dressing yourself? No  ?   ? Difficulty preparing food or eating? No  ?   ? Difficulty managing medications? No  ?   ? Difficulty managing your finances? No  ?   ? Difficulty affording your medications? No  ?   ?   ?   ?   ?   ?   ? ?Social Determinants of Health  ? ?Financial Resource Strain: Not on file  ?Food Insecurity: Not on file  ?Transportation Needs: Not on file  ?Physical Activity: Not on file  ?Stress: Not on file  ?Social Connections: Not on file  ?  ? ?Family History: ?The patient's family history includes Hypertension in her father; Tuberculosis in her mother. ? ?ROS:   ?Please see the history of present illness.    ? All other systems reviewed and are negative. ? ?EKGs/Labs/Other Studies Reviewed:   ? ?The following studies were reviewed today: ? ? ?EKG:  EKG is  ordered today.  The ekg ordered today demonstrates  ? ?05/01/2021- NSR, PACs ? ?Recent Labs: ?02/21/2021: ALT 11; BUN 18; Creat 0.90; Hemoglobin 13.1; Platelets 267; Potassium 4.5; Sodium 142; TSH 1.01  ?Recent Lipid Panel ?   ?Component Value Date/Time  ? CHOL 159 02/21/2021 1034  ? TRIG 77 02/21/2021 1034  ? HDL 57 02/21/2021 1034  ? CHOLHDL 2.8 02/21/2021 1034  ? LDLCALC 85 02/21/2021 1034  ? ? ? ?Risk Assessment/Calculations:   ?  ? ?    ? ?Physical Exam:   ? ?VS:  BP (!) 164/94   Pulse 72   Ht 5\' 1"  (1.549 m)   Wt 171 lb (77.6 kg)   SpO2 97%   BMI 32.31 kg/m?    ? ?Wt Readings from Last 3 Encounters:  ?05/01/21 171 lb (77.6 kg)  ?04/23/21 172 lb (78 kg)  ?02/21/21 173 lb 12.8 oz (78.8 kg)  ?  ? ?GEN:  Well nourished, well developed in no acute distress ?HEENT: Normal ?NECK: No JVD; No carotid bruits ?LYMPHATICS: No  lymphadenopathy ?CARDIAC: RRR, SEM RUSM, rubs, gallops ?RESPIRATORY:  Clear to auscultation without rales, wheezing or rhonchi  ?ABDOMEN: Soft, non-tender, non-distended ?MUSCULOSKELETAL:  No edema; No deformity  ?SKIN: Warm and dry ?NEUROLOGIC:  Alert and oriented x 3 ?PSYCHIATRIC:  Normal affect  ? ?ASSESSMENT:   ? ?#DOE: Ddx includes coronary dx vs. valve dx. She has SEM c/f aortic stenosis, don't suspect it is severe, will obtain a 2D echo. With risk factors including HTN, age will get lexiscan to assess for coronary dx. She is euvolemic. No signs of heart failure.  ? ?#HTN: not well controlled. Will stop atenolol and start carvediolol 25 mg BID. Continue olmesartan 40 mg daily. ? ?PLAN:   ? ?In order of problems listed above: ? ?TTE ?Lexiscan ?Add carvedilol 25 mg BID ?Follow up 3 months ? ?   ?Shared Decision Making/Informed Consent ?The risks [chest pain, shortness of breath, cardiac arrhythmias, dizziness, blood pressure fluctuations, myocardial infarction, stroke/transient ischemic attack, nausea, vomiting, allergic reaction, radiation exposure, metallic taste sensation and life-threatening complications (estimated to be 1 in 10,000)], benefits (risk stratification, diagnosing coronary artery disease, treatment guidance) and alternatives of a nuclear stress test were discussed in detail with Ms. Buchan and she agrees to proceed.  ? ? ?Medication Adjustments/Labs and Tests Ordered: ?Current medicines are reviewed at length with the patient today.  Concerns regarding medicines are outlined above.  ?No orders of the defined types were placed in this encounter. ? ?No orders of the defined types were placed in this encounter. ? ? ?Patient Instructions  ?Medication Instructions:  ?START: CARVEDILOL 25mg  TWICE DAILY  ?*If you need a refill on your cardiac medications before your next appointment, please call your pharmacy ? ?Testing/Procedures: ?Your physician has requested that you have a lexiscan myoview. For  further information please visit . Please follow instruction sheet, as given. ?This will take place at [email or web address] ? ?Your physician has requested that you have an echocardiogram. [employer]

## 2021-05-01 NOTE — Patient Instructions (Addendum)
Medication Instructions:  ? ?STOP: ATENOLOL  ? ?START: CARVEDILOL 25mg  TWICE DAILY  ? ? ?*If you need a refill on your cardiac medications before your next appointment, please call your pharmacy ? ?Testing/Procedures: ?Your physician has requested that you have a lexiscan myoview. For further information please visit . Please follow instruction sheet, as given. ?This will take place at [email or web address] ? ?Your physician has requested that you have an echocardiogram. Echocardiography is a painless test that uses sound waves to create images of your heart. It provides your doctor with information about the size and shape of your heart and how well your heart?s chambers and valves are working. You may receive an ultrasound enhancing agent through an IV if needed to better visualize your heart during the echo.This procedure takes approximately one hour. There are no restrictions for this procedure. This will take place at the [address].   ? ?Follow-Up: ?At [facility], you and your health needs are our priority.  As part of our continuing mission to provide you with exceptional heart care, we have created designated Provider Care Teams.  These Care Teams include your primary Cardiologist (physician) and Advanced Practice Providers (APPs -  Physician Assistants and Nurse Practitioners) who all work together to provide you with the care you need, when you need it. ? ?Your next appointment:   ?3 month(s) ? ?The format for your next appointment:   ?In Person ? ?Provider:   ?[facility], MD   ?

## 2021-05-14 ENCOUNTER — Telehealth (HOSPITAL_COMMUNITY): Payer: Self-pay | Admitting: *Deleted

## 2021-05-14 NOTE — Telephone Encounter (Signed)
Patient given detailed instructions per Myocardial Perfusion Study Information Sheet for the test on 05/21/21 Patient notified to arrive 15 minutes early and that it is imperative to arrive on time for appointment to keep from having the test rescheduled. ? If you need to cancel or reschedule your appointment, please call the office within 24 hours of your appointment. . Patient verbalized understanding.Mohrmann, Brandn Mcgath Jacqueline ? ? ?

## 2021-05-21 ENCOUNTER — Ambulatory Visit (HOSPITAL_COMMUNITY): Payer: Medicare Other | Attending: Cardiovascular Disease

## 2021-05-21 ENCOUNTER — Ambulatory Visit (HOSPITAL_BASED_OUTPATIENT_CLINIC_OR_DEPARTMENT_OTHER): Payer: Medicare Other

## 2021-05-21 DIAGNOSIS — R0609 Other forms of dyspnea: Secondary | ICD-10-CM

## 2021-05-21 LAB — ECHOCARDIOGRAM COMPLETE
AR max vel: 1.12 cm2
AV Area VTI: 1.23 cm2
AV Area mean vel: 1.08 cm2
AV Mean grad: 10 mmHg
AV Peak grad: 16.8 mmHg
Ao pk vel: 2.05 m/s
Area-P 1/2: 4.44 cm2
Height: 61 in
S' Lateral: 3 cm
Weight: 2736 oz

## 2021-05-21 LAB — MYOCARDIAL PERFUSION IMAGING
Base ST Depression (mm): 0 mm
LV dias vol: 84 mL (ref 46–106)
LV sys vol: 38 mL
Nuc Stress EF: 54 %
Peak HR: 93 {beats}/min
Rest HR: 67 {beats}/min
Rest Nuclear Isotope Dose: 11 mCi
SDS: 4
SRS: 0
SSS: 4
ST Depression (mm): 0 mm
Stress Nuclear Isotope Dose: 32.5 mCi
TID: 0.95

## 2021-05-21 MED ORDER — TECHNETIUM TC 99M TETROFOSMIN IV KIT
11.0000 | PACK | Freq: Once | INTRAVENOUS | Status: AC | PRN
  Administered 2021-05-21: 11 via INTRAVENOUS
  Filled 2021-05-21: qty 11

## 2021-05-21 MED ORDER — TECHNETIUM TC 99M TETROFOSMIN IV KIT
32.5000 | PACK | Freq: Once | INTRAVENOUS | Status: AC | PRN
  Administered 2021-05-21: 32.5 via INTRAVENOUS
  Filled 2021-05-21: qty 33

## 2021-05-21 MED ORDER — REGADENOSON 0.4 MG/5ML IV SOLN
0.4000 mg | Freq: Once | INTRAVENOUS | Status: AC
  Administered 2021-05-21: 0.4 mg via INTRAVENOUS

## 2021-06-04 NOTE — Progress Notes (Signed)
? ?New Patient Note ? ?RE: Ashley Johns MRN: [id number] DOB: [birth year] ?Date of Office Visit: 06/05/2021 ? ?Consult requested by: Caesar Bookman, NP ?Primary care provider: Ngetich, Donalee Citrin, NP ? ?Chief Complaint: Nasal Congestion (COUGHING WITH NASAL CONGESTION AND SOMETIMES BREAKS OUT ON ARMS) ? ?History of Present Illness: ?I had the pleasure of seeing Ashley Johns for initial evaluation at the [facility] on 06/06/2021. She is a 86 y.o. female, who is referred here by Ngetich, Donalee Citrin, NP for the evaluation of rhinitis and rash.  She is accompanied today by her daughter who provided/contributed to the history.  ? ?Patient is hard of hearing and did not wear her hearing aids to today's appointment.  ? ?She reports symptoms of nasal congestion, coughing, rhinorrhea. Symptoms have been going on for 4 years. The symptoms are present all year around with worsening in spring. Anosmia: no. Headache: yes in the mornings. She has used zyrtec, Mucinex DM, benadryl, ipratropium with some improvement in symptoms. Sinus infections: maybe. Previous work up includes: none. ?Previous ENT evaluation: no and no prior surgery. ?Previous sinus imaging: no. ?History of nasal polyps: no. ?History of reflux: sometimes. ? ?Rash started about 2 years ago. Mainly occurs on her arms and legs. Describes them as itchy, red, raised. Individual rashes lasts until she uses the mometasone cream.  Associated symptoms include: none.  ?Frequency of episodes: once per month. ?Suspected triggers are unknown. Denies any fevers, chills, changes in medications, foods, personal care products or recent infections. She has tried the following therapies: mometasone with some benefit. Previous work up includes: saw dermatology and as told to use some eczema lotion.  ?Previous history of rash/hives: used to have eczema. ?Patient is up to date with the following cancer screening tests: physical exam, mammogram, colonoscopy.   ? ?Assessment and Plan: ?Sharin is a 86 y.o. female with: ?Other allergic rhinitis ?Perennial rhinitis x 4 years. Tried antihistamines and ipratropium with some benefit. No prior allergy testing/ENT evaluation.  ?Today's skin testing showed: Positive to dust mites and cockroaches.  ?Start environmental control measures as below. ?Try Allegra (fexofenadine), or Xyzal (levocetirizine) daily as needed. May switch antihistamines every few months. ?Use Flonase (fluticasone) nasal spray 1 spray per nostril twice a day as needed for nasal congestion.  ?Use Atrovent (ipratropium) 0.03% 1-2 sprays per nostril twice a day as needed for runny nose/drainage. ?Nasal saline spray (i.e., Simply Saline) or nasal saline lavage (i.e., NeilMed) is recommended as needed and prior to medicated nasal sprays. ? ?Rash and other nonspecific skin eruption ?Rash x 2 years on arms and legs. Flares about once per month and uses mometasone prn with good benefit. Patient thought she saw dermatology in the past and was told to use eczema lotion. She's concerned about allergic triggers.  ?Today's skin testing showed: Positive to dust mites and cockroaches. Negative to select foods.  ?No active current flare but has hyperpigmented areas of healing.  ?Keep track of episodes and take pictures.  ?See below for proper skin care.  ?Make sure you are using things without fragrances and scents.  ? ?Generalized headache ?Headaches in the mornings and snores at night. Denies sleep apnea. ?Follow up with PCP to rule out sleep apnea.  ? ?Dyspnea on exertion ?Uses albuterol prn with some benefit - mainly with exertion. Follows with cardiology. ?Today's spirometry showed: severe restrictive disease with 6% improvement in FEV1 post bronchodilator treatment. Clinically feeling improved.  ?Reviewed proper inhaler technique as she had poor  technique. ?May use albuterol rescue inhaler 2 puffs every 4 to 6 hours as needed for shortness of breath, chest tightness,  coughing, and wheezing. Monitor frequency of use.  ?Spacer given and demonstrated proper use with inhaler. Patient understood technique and all questions/concerned were addressed.  ? ?Return in about 3 months (around 09/04/2021). ? ?Meds ordered this encounter  ?Medications  ? fluticasone (FLONASE) 50 MCG/ACT nasal spray  ?  Sig: Place 1 spray into both nostrils 2 (two) times daily as needed (nasal congestion).  ?  Dispense:  16 g  ?  Refill:  5  ? ?Lab Orders  ?No laboratory test(s) ordered today  ? ? ?Other allergy screening: ?Asthma:  ?Uses Albuterol inhaler due to shortness of breath with exertion with some benefit. ?Saw cardiology for this.  ?Food allergy: no ?Medication allergy: no ?Hymenoptera allergy:  ?Large localized reactions with shortness of breath. ?Has Epipen on hand if needed.   ?Eczema: yes ?History of recurrent infections suggestive of immunodeficency: no ? ?Diagnostics: ?Spirometry:  ?Tracings reviewed. Her effort: It was hard to get consistent efforts and there is a question as to whether this reflects a maximal maneuver. ?FVC: 0.71L ?FEV1: 0.70L, 56% predicted ?FEV1/FVC ratio: 99% ?Interpretation: Spirometry consistent with severe restrictive disease with 6% improvement in FEV1 post bronchodilator treatment. Clinically feeling improved.  ? ?Please see scanned spirometry results for details. ? ?Skin Testing: Environmental allergy panel and select foods. ?Positive to dust mites and cockroaches.  ?Negative to select foods.  ?Results discussed with patient/family. ? Airborne Adult Perc - 06/05/21 1449   ? ? Time Antigen Placed 1439   ? Allergen Manufacturer Waynette Buttery   ? Location Back   ? Number of Test 59   ? 1. Control-Buffer 50% Glycerol Negative   ? 2. Control-Histamine 1 mg/ml 3+   ? 3. Albumin saline Negative   ? 4. Bahia Negative   ? 5. French Southern Territ[place] Negative   ? 6. Johnson Negative   ? 7. Kentucky Blue Negative   ? 8. Meadow Fescue Negative   ? 9. Perennial Rye Negative   ? 10. Sweet Vernal Negative    ? 11. Timothy Negative   ? 12. Cocklebur Negative   ? 13. Burweed Marshelder Negative   ? 14. Ragweed, short Negative   ? 15. Ragweed, Giant Negative   ? 16. Plantain,  English Negative   ? 17. Lamb's Quarters Negative   ? 18. Sheep Sorrell Negative   ? 19. Rough Pigweed Negative   ? 20. Marsh Elder, Rough Negative   ? 21. Mugwort, Common Negative   ? 22. Ash mix Negative   ? 23. Charletta Cousin mix Negative   ? 24. Beech American Negative   ? 25. Box, Elder Negative   ? 26. Cedar, red Negative   ? 27. Cottonwood, Guinea-Bissau Negative   ? 28. Elm mix Negative   ? 29. Hickory Negative   ? 30. Maple mix Negative   ? 31. Oak, Guinea-Bissau mix Negative   ? 32. Pecan Pollen Negative   ? 33. Pine mix Negative   ? 34. Sycamore Eastern Negative   ? 35. Walnut, Black Pollen Negative   ? 36. Alternaria alternata Negative   ? 37. Cladosporium Herbarum Negative   ? 38. Aspergillus mix Negative   ? 39. Penicillium mix Negative   ? 40. Bipolaris sorokiniana (Helminthosporium) Negative   ? 41. Drechslera spicifera (Curvularia) Negative   ? 42. Mucor plumbeus Negative   ? 43. Fusarium moniliforme Negative   ?  44. Aureobasidium pullulans (pullulara) Negative   ? 45. Rhizopus oryzae Negative   ? 46. Botrytis cinera Negative   ? 47. Epicoccum nigrum Negative   ? 48. Phoma betae Negative   ? 49. Candida Albicans Negative   ? 50. Trichophyton mentagrophytes Negative   ? 51. Mite, D Farinae  5,000 AU/ml 2+   ? 52. Mite, D Pteronyssinus  5,000 AU/ml 3+   ? 53. Cat Hair 10,000 BAU/ml Negative   ? 54.  Dog Epithelia Negative   ? 55. Mixed Feathers Negative   ? 56. Horse Epithelia Negative   ? 57. Cockroach, Micrones[place] Negative   ? 58. Mouse Negative   ? 59. Tobacco Leaf Negative   ? ?  ?  ? ?  ? ? Food Perc - 06/05/21 1449   ? ?  ? Test Information  ? Time Antigen Placed 1439   ? Allergen Manufacturer Waynette ButteryGreer   ? Location Back   ? Number of allergen test 10   ?  ? Food  ? 1. Peanut Negative   ? 2. Soybean food Negative   ? 3. Wheat, whole Negative   ? 4. Sesame  Negative   ? 5. Milk, cow Negative   ? 6. Egg White, chicken Negative   ? 7. Casein Negative   ? 8. Shellfish mix Negative   ? 9. Fish mix Negative   ? 10. Cashew Negative   ? ?  ?  ? ?  ? ? Intradermal - 06/05/21 1

## 2021-06-05 ENCOUNTER — Telehealth: Payer: Self-pay | Admitting: *Deleted

## 2021-06-05 ENCOUNTER — Ambulatory Visit: Payer: Medicare Other | Admitting: Allergy

## 2021-06-05 ENCOUNTER — Encounter: Payer: Self-pay | Admitting: Allergy

## 2021-06-05 VITALS — BP 132/82 | HR 80 | Temp 97.1°F | Resp 18 | Ht 62.0 in | Wt 170.0 lb

## 2021-06-05 DIAGNOSIS — R21 Rash and other nonspecific skin eruption: Secondary | ICD-10-CM

## 2021-06-05 DIAGNOSIS — R0609 Other forms of dyspnea: Secondary | ICD-10-CM

## 2021-06-05 DIAGNOSIS — J3089 Other allergic rhinitis: Secondary | ICD-10-CM | POA: Insufficient documentation

## 2021-06-05 DIAGNOSIS — R519 Headache, unspecified: Secondary | ICD-10-CM

## 2021-06-05 MED ORDER — FLUTICASONE PROPIONATE 50 MCG/ACT NA SUSP: 1.0000 | Freq: Two times a day (BID) | NASAL | 5 refills | Status: DC | PRN

## 2021-06-05 NOTE — Patient Instructions (Addendum)
Today's skin testing showed: ?Positive to dust mites and cockroaches.  ?Negative to select foods.  ? ?Results given. ? ?Environmental allergies ?Start environmental control measures as below. ?Try Allegra (fexofenadine), or Xyzal (levocetirizine) daily as needed. May switch antihistamines every few months. ?Use Flonase (fluticasone) nasal spray 1 spray per nostril twice a day as needed for nasal congestion.  ?Use Atrovent (ipratropium) 0.03% 1-2 sprays per nostril twice a day as needed for runny nose/drainage. ?Nasal saline spray (i.e., Simply Saline) or nasal saline lavage (i.e., NeilMed) is recommended as needed and prior to medicated nasal sprays. ? ?Rash ?Keep track of episodes and take pictures.  ?See below for proper skin care.  ?Make sure you are using things without fragrances and scents.  ? ?Breathing: ?May use albuterol rescue inhaler 2 puffs every 4 to 6 hours as needed for shortness of breath, chest tightness, coughing, and wheezing. Monitor frequency of use.  ?Spacer given and demonstrated proper use with inhaler. Patient understood technique and all questions/concerned were addressed.  ? ?Headaches in the mornings ?Check with PCP to rule out sleep apnea.  ? ?Follow up in 3 months or sooner if needed. ? ?Control of House Dust Mite Allergen ?Dust mite allergens are a common trigger of allergy and asthma symptoms. While they can be found throughout the house, these microscopic creatures thrive in warm, humid environments such as bedding, upholstered furniture and carpeting. ?Because so much time is spent in the bedroom, it is essential to reduce mite levels there.  ?Encase pillows, mattresses, and box springs in special allergen-proof fabric covers or airtight, zippered plastic covers.  ?Bedding should be washed weekly in hot water (130? F) and dried in a hot dryer. Allergen-proof covers are available for comforters and pillows that can?t be regularly washed.  ?Wash the allergy-proof covers every few  months. Minimize clutter in the bedroom. Keep pets out of the bedroom.  ?Keep humidity less than 50% by using a dehumidifier or air conditioning. You can buy a humidity measuring device called a hygrometer to monitor this.  ?If possible, replace carpets with hardwood, linoleum, or washable area rugs. If that's not possible, vacuum frequently with a vacuum that has a HEPA filter. ?Remove all upholstered furniture and non-washable window drapes from the bedroom. ?Remove all non-washable stuffed toys from the bedroom.  Wash stuffed toys weekly. ? ?Cockroach Allergen Avoidance ?Cockroaches are often found in the homes of densely populated urban areas, schools or commercial buildings, but these creatures can lurk almost anywhere. This does not mean that you have a dirty house or living area. ?Block all areas where roaches can enter the home. This includes crevices, wall cracks and windows.  ?Cockroaches need water to survive, so fix and seal all leaky faucets and pipes. Have an exterminator go through the house when your family and pets are gone to eliminate any remaining roaches. ?Keep food in lidded containers and put pet food dishes away after your pets are done eating. Vacuum and sweep the floor after meals, and take out garbage and recyclables. Use lidded garbage containers in the kitchen. Wash dishes immediately after use and clean under stoves, refrigerators or toasters where crumbs can accumulate. Wipe off the stove and other kitchen surfaces and cupboards regularly. ? ?Skin care recommendations ? ?Bath time: ?Always use lukewarm water. AVOID very hot or cold water. ?Keep bathing time to 5-10 minutes. ?Do NOT use bubble bath. ?Use a mild soap and use just enough to wash the dirty areas. ?Do NOT scrub skin vigorously.  ?  After bathing, pat dry your skin with a towel. Do NOT rub or scrub the skin. ? ?Moisturizers and prescriptions:  ?ALWAYS apply moisturizers immediately after bathing (within 3 minutes). This helps  to lock-in moisture. ?Use the moisturizer several times a day over the whole body. ?Good summer moisturizers include: Aveeno, CeraVe, Cetaphil. ?Good winter moisturizers include: Aquaphor, Vaseline, Cerave, Cetaphil, Eucerin, Vanicream. ?When using moisturizers along with medications, the moisturizer should be applied about one hour after applying the medication to prevent diluting effect of the medication or moisturize around where you applied the medications. When not using medications, the moisturizer can be continued twice daily as maintenance. ? ?Laundry and clothing: ?Avoid laundry products with added color or perfumes. ?Use unscented hypo-allergenic laundry products such as Tide free, Cheer free & gentle, and All free and clear.  ?If the skin still seems dry or sensitive, you can try double-rinsing the clothes. ?Avoid tight or scratchy clothing such as wool. ?Do not use fabric softeners or dyer sheets.  ?

## 2021-06-05 NOTE — Telephone Encounter (Signed)
Judeen Hammans called and left message on Clinical intake stating that she was calling in regards to an Echocardiogram.  ? ?Tried calling her back, LMOM to return call.  ?

## 2021-06-05 NOTE — Assessment & Plan Note (Addendum)
Perennial rhinitis x 4 years. Tried antihistamines and ipratropium with some benefit. No prior allergy testing/ENT evaluation.  ?? Today's skin testing showed: Positive to dust mites and cockroaches.  ?? Start environmental control measures as below. ?? Try Allegra (fexofenadine), or Xyzal (levocetirizine) daily as needed. May switch antihistamines every few months. ?? Use Flonase (fluticasone) nasal spray 1 spray per nostril twice a day as needed for nasal congestion.  ?? Use Atrovent (ipratropium) 0.03% 1-2 sprays per nostril twice a day as needed for runny nose/drainage. ?? Nasal saline spray (i.e., Simply Saline) or nasal saline lavage (i.e., NeilMed) is recommended as needed and prior to medicated nasal sprays. ?

## 2021-06-06 NOTE — Assessment & Plan Note (Signed)
Rash x 2 years on arms and legs. Flares about once per month and uses mometasone prn with good benefit. Patient thought she saw dermatology in the past and was told to use eczema lotion. She's concerned about allergic triggers.  ?? Today's skin testing showed: Positive to dust mites and cockroaches. Negative to select foods.  ?? No active current flare but has hyperpigmented areas of healing.  ?? Keep track of episodes and take pictures.  ?? See below for proper skin care.  ?o Make sure you are using things without fragrances and scents.  ?

## 2021-06-06 NOTE — Assessment & Plan Note (Signed)
Uses albuterol prn with some benefit - mainly with exertion. Follows with cardiology. ?? Today's spirometry showed: severe restrictive disease with 6% improvement in FEV1 post bronchodilator treatment. Clinically feeling improved.  ?? Reviewed proper inhaler technique as she had poor technique. ?? May use albuterol rescue inhaler 2 puffs every 4 to 6 hours as needed for shortness of breath, chest tightness, coughing, and wheezing. Monitor frequency of use.  ?? Spacer given and demonstrated proper use with inhaler. Patient understood technique and all questions/concerned were addressed.  ?

## 2021-06-06 NOTE — Assessment & Plan Note (Signed)
Headaches in the mornings and snores at night. Denies sleep apnea. ?? Follow up with PCP to rule out sleep apnea.  ?

## 2021-06-11 NOTE — Telephone Encounter (Signed)
Ashley Johns, CMA  ?05/23/2021  9:00 AM EDT   ?  ?The patient has been notified of the result and verbalized understanding.  All questions (if any) were answered. ?Ashley Johns, CMA 05/23/2021 9:00 AM   ? Maisie Fus, MD  ?05/22/2021  5:29 PM EDT   ?  ?Hi Jenna, please let Mrs. Alles know that her pumping function is mildly down. This is not concerning. Her stress test did not show any blockages.She does aortic valve stenosis but it is not significant, this is something that can be re-assessed in the future. No changes in her medications or more testing for now  ? ? ? ?Echocardiogram done through the Cardiology office and patient was notified.  ?Tried calling [facility] and [facility] to return call  ?

## 2021-06-12 ENCOUNTER — Encounter: Payer: Self-pay | Admitting: Family

## 2021-06-12 ENCOUNTER — Ambulatory Visit (INDEPENDENT_AMBULATORY_CARE_PROVIDER_SITE_OTHER): Payer: Medicare Other | Admitting: Family

## 2021-06-12 VITALS — BP 110/60 | HR 95 | Temp 97.7°F | Resp 16 | Ht 62.0 in

## 2021-06-12 DIAGNOSIS — Z780 Asymptomatic menopausal state: Secondary | ICD-10-CM | POA: Diagnosis not present

## 2021-06-12 DIAGNOSIS — R32 Unspecified urinary incontinence: Secondary | ICD-10-CM | POA: Diagnosis not present

## 2021-06-12 LAB — POCT URINALYSIS DIPSTICK
Glucose, UA: NEGATIVE
Ketones, UA: POSITIVE
Nitrite, UA: POSITIVE
Protein, UA: NEGATIVE
Spec Grav, UA: >=1.03 — AB (ref 1.010–1.025)
Urobilinogen, UA: NEGATIVE E.U./dL — AB
pH, UA: 5 (ref 5.0–8.0)

## 2021-06-12 NOTE — Progress Notes (Signed)
? ?Provider: Richarda Blade Johns ? ?Ashley Johns, Ashley Citrin, NP ? ?Patient Care Team: ?Ashley Johns, Ashley Citrin, NP as PCP - General (Family Medicine) ?Ashley Fus, MD as PCP - Cardiology (Cardiology) ?Ashley Lot, MD as Consulting Physician (Dermatology) ? ?Extended Emergency Contact Information ?Primary Emergency Contact: Ashley Johns ? [place] ?Mobile Phone: [phone] ?Relation: Daughter ?Secondary Emergency Contact: Ashley Johns ?Mobile Phone: [phone] ?Relation: Granddaughter ? ?Code Status:  Full Code  ?Goals of care: Advanced Directive information ? ?  06/12/2021  ? 10:43 AM  ?Advanced Directives  ?Does Patient Have a Medical Advance Directive? No  ?Would patient like information on creating a medical advance directive? No - Patient declined  ? ? ? ?Chief Complaint  ?Patient presents with  ? Acute Visit  ?  Patient complains of urinary incontinence  X 1 week.  ? ? ?HPI:  ?Pt is a 86 y.o. female seen today for an acute visit for evaluation of urinary incontinence x 1 week.Has had urine urgency. denies any fever,chills,nausea,vomiting,abdominal pain,flank pain,frequency,dysuria,difficult urination or hematuria, ? ? ? ?Past Medical History:  ?Diagnosis Date  ? Eczema   ? Per [facility] new patient packet  ? Graves disease   ? Per [facility] new patient packet  ? High blood pressure   ? Per [facility] new patient packet  ? Hypertension   ? ?Past Surgical History:  ?Procedure Laterality Date  ? TOTAL ABDOMINAL HYSTERECTOMY Bilateral 1970's  ? TUBAL LIGATION    ? ? ?Allergies  ?Allergen Reactions  ? Codeine Nausea Only  ?  unknown  ? Tramadol Nausea Only  ? ? ?Outpatient Encounter Medications as of 06/12/2021  ?Medication Sig  ? acetaminophen (TYLENOL) 650 MG CR tablet Take 650 mg by mouth every 8 (eight) hours as needed for pain.  ? carvedilol (COREG) 25 MG tablet Take 1 tablet (25 mg total) by mouth 2 (two) times daily.  ? cetirizine (ZYRTEC) 10 MG tablet Take 10 mg by mouth as needed for allergies.  ? EPINEPHrine  (EPIPEN 2-PAK) 0.3 mg/0.3 mL IJ SOAJ injection Inject 0.3 mg into the muscle as needed. Inject 0.3mg  intramuscular route once as needed.  ? fluticasone (FLONASE) 50 MCG/ACT nasal spray Place 1 spray into both nostrils 2 (two) times daily as needed (nasal congestion).  ? ipratropium (ATROVENT) 0.03 % nasal spray USE 2 SPRAYS IN EACH NOSTRIL TWICE DAILY  ? mometasone (ELOCON) 0.1 % cream Apply 1 application. topically as needed.  ? olmesartan (BENICAR) 40 MG tablet Take 40 mg by mouth daily.  ? polyethylene glycol powder (GLYCOLAX/MIRALAX) 17 GM/SCOOP powder Take 17 g by mouth 2 (two) times daily as needed.  ? simvastatin (ZOCOR) 20 MG tablet Take 1 tablet (20 mg total) by mouth daily.  ? [DISCONTINUED] aspirin EC 81 MG tablet Take 81 mg by mouth daily. Swallow whole.  ? [DISCONTINUED] cetirizine (ZYRTEC) 10 MG tablet Take 1 tablet (10 mg total) by mouth daily.  ? [DISCONTINUED] mometasone (ELOCON) 0.1 % cream APPLY 1 APPLICATION TOPICALLY TO THE AFFETED AREA TWICE DAILY (Patient not taking: Reported on 06/05/2021)  ? [DISCONTINUED] olmesartan (BENICAR) 40 MG tablet TAKE 1 TABLET(40 MG) BY MOUTH DAILY  ? ?No facility-administered encounter medications on file as of 06/12/2021.  ? ? ?Review of Systems  ?Constitutional:  Negative for appetite change, chills, fatigue and fever.  ?Gastrointestinal:  Negative for abdominal distention, abdominal pain, constipation, diarrhea, nausea and vomiting.  ?Genitourinary:  Positive for urgency. Negative for difficulty urinating, dysuria, flank pain and frequency.  ?  Incontinent   ?Musculoskeletal:  Negative for arthralgias and back pain.  ?Psychiatric/Behavioral:  Negative for agitation, behavioral problems and confusion.   ? ?Immunization History  ?Administered Date(s) Administered  ? Fluad Quad(high Dose 65+) 10/11/2018  ? Influenza, High Dose Seasonal PF 12/03/2015, 11/07/2019  ? Influenza,inj,Quad PF,6+ Mos 10/11/2018  ? Influenza-Unspecified 11/16/2017, 11/27/2020  ? PFIZER  Comirnaty(Gray Top)Covid-19 Tri-Sucrose Vaccine 07/31/2020  ? PFIZER(Purple Top)SARS-COV-2 Vaccination 02/18/2019, 03/11/2019, 11/29/2019, 07/31/2020  ? Pneumococcal Conjugate-13 10/11/2018  ? Td 10/11/2018  ? Tdap 10/11/2018  ? ?Pertinent  Health Maintenance Due  ?Topic Date Due  ? INFLUENZA VACCINE  09/09/2021  ? DEXA SCAN  Completed  ? ? ?  07/29/2020  ?  3:36 PM 08/07/2020  ?  2:25 PM 08/21/2020  ? 10:08 AM 02/21/2021  ?  9:33 AM 06/12/2021  ? 10:43 AM  ?Fall Risk  ?Falls in the past year? 0 0 0 0 0  ?Was there an injury with Fall? 0 0 0 0 0  ?Fall Risk Category Calculator 0 0 0 0 0  ?Fall Risk Category Low Low Low Low Low  ?Patient Fall Risk Level Low fall risk Low fall risk Low fall risk Low fall risk Low fall risk  ?Patient at Risk for Falls Due to No Fall Risks No Fall Risks No Fall Risks No Fall Risks No Fall Risks  ?Fall risk Follow up  Falls evaluation completed Falls evaluation completed Falls evaluation completed Falls evaluation completed  ? ?Functional Status Survey: ?  ? ?Vitals:  ? 06/12/21 1035  ?BP: 110/60  ?Pulse: 95  ?Resp: 16  ?Temp: 97.7 ?F (36.5 ?C)  ?SpO2: 98%  ?Height: 5\' 2"  (1.575 m)  ? ?Body mass index is 31.09 kg/m? ?Physical Exam ?Vitals reviewed.  ?Constitutional:   ?   General: She is not in acute distress. ?   Appearance: Normal appearance. She is not ill-appearing or diaphoretic.  ?HENT:  ?   Head: Normocephalic.  ?Eyes:  ?   General: No scleral icterus.    ?   Right eye: No discharge.     ?   Left eye: No discharge.  ?   Conjunctiva/sclera: Conjunctivae normal.  ?   Pupils: Pupils are equal, round, and reactive to light.  ?Cardiovascular:  ?   Rate and Rhythm: Normal rate. Rhythm irregular.  ?   Pulses: Normal pulses.  ?   Heart sounds: Normal heart sounds. No murmur heard. ?  No friction rub. No gallop.  ?Pulmonary:  ?   Effort: Pulmonary effort is normal. No respiratory distress.  ?   Breath sounds: Normal breath sounds. No wheezing, rhonchi or rales.  ?Chest:  ?   Chest wall: No  tenderness.  ?Abdominal:  ?   General: Bowel sounds are normal. There is no distension.  ?   Palpations: Abdomen is soft. There is no mass.  ?   Tenderness: There is no abdominal tenderness. There is no right CVA tenderness, left CVA tenderness, guarding or rebound.  ?Skin: ?   General: Skin is warm and dry.  ?   Coloration: Skin is not pale.  ?   Findings: No erythema or rash.  ?Neurological:  ?   Mental Status: She is alert and oriented to person, place, and time.  ?   Sensory: No sensory deficit.  ?   Motor: No weakness.  ?   Gait: Gait normal.  ?Psychiatric:     ?   Mood and Affect: Mood normal.     ?  Speech: Speech normal.     ?   Behavior: Behavior normal.  ? ? ?Labs reviewed: ?Recent Labs  ?  08/21/20 ?1056 02/21/21 ?1034  ?NA 142 142  ?K 4.3 4.5  ?CL 106 106  ?CO2 27 28  ?GLUCOSE 89 84  ?BUN 19 18  ?CREATININE 0.91 0.90  ?CALCIUM 9.2 9.3  ? ?Recent Labs  ?  08/21/20 ?1056 02/21/21 ?1034  ?AST 17 15  ?ALT 13 11  ?BILITOT 0.5 0.4  ?PROT 7.4 7.5  ? ?Recent Labs  ?  07/29/20 ?1621 08/21/20 ?1056 02/21/21 ?1034  ?WBC 5.8 6.1 5.8  ?NEUTROABS 3,880 4,532 3,845  ?HGB 13.3 13.1 13.1  ?HCT 40.8 41.0 40.3  ?MCV 89.5 91.5 90.6  ?PLT 270 273 267  ? ?Lab Results  ?Component Value Date  ? TSH 1.01 02/21/2021  ? ?Lab Results  ?Component Value Date  ? HGBA1C 6.0 (H) 02/21/2021  ? ?Lab Results  ?Component Value Date  ? CHOL 159 02/21/2021  ? HDL 57 02/21/2021  ? LDLCALC 85 02/21/2021  ? TRIG 77 02/21/2021  ? CHOLHDL 2.8 02/21/2021  ? ? ?Significant Diagnostic Results in last 30 days:  ?MYOCARDIAL PERFUSION IMAGING ? ?Result Date: 05/21/2021 ?  Perfusion is normal. The study is low risk.   No ST deviation was noted.   Left ventricular function is abnormal. Global function is mildly reduced. Nuclear stress EF: 54 %. The left ventricular ejection fraction is mildly decreased (45-54%). End diastolic cavity size is normal.   Prior study not available for comparison.  ? ?ECHOCARDIOGRAM COMPLETE ? ?Result Date: 05/21/2021 ?    ECHOCARDIOGRAM REPORT   Patient Name:   Ashley Johns Date of Exam: 05/21/2021 Medical Rec #:  [id number]       Height:       61.0 in Accession #:    [id number]      Weight:       171.0 lb Date of Birth:  1935/11/03

## 2021-06-12 NOTE — Patient Instructions (Signed)
Urinary Tract Infection, Adult  A urinary tract infection (UTI) is an infection of any part of the urinary tract. The urinary tract includes the kidneys, ureters, bladder, and urethra. These organs make, store, and get rid of urine in the body. An upper UTI affects the ureters and kidneys. A lower UTI affects the bladder and urethra. What are the causes? Most urinary tract infections are caused by bacteria in your genital area around your urethra, where urine leaves your body. These bacteria grow and cause inflammation of your urinary tract. What increases the risk? You are more likely to develop this condition if: You have a urinary catheter that stays in place. You are not able to control when you urinate or have a bowel movement (incontinence). You are female and you: Use a spermicide or diaphragm for birth control. Have low estrogen levels. Are pregnant. You have certain genes that increase your risk. You are sexually active. You take antibiotic medicines. You have a condition that causes your flow of urine to slow down, such as: An enlarged prostate, if you are female. Blockage in your urethra. A kidney stone. A nerve condition that affects your bladder control (neurogenic bladder). Not getting enough to drink, or not urinating often. You have certain medical conditions, such as: Diabetes. A weak disease-fighting system (immunesystem). Sickle cell disease. Gout. Spinal cord injury. What are the signs or symptoms? Symptoms of this condition include: Needing to urinate right away (urgency). Frequent urination. This may include small amounts of urine each time you urinate. Pain or burning with urination. Blood in the urine. Urine that smells bad or unusual. Trouble urinating. Cloudy urine. Vaginal discharge, if you are female. Pain in the abdomen or the lower back. You may also have: Vomiting or a decreased appetite. Confusion. Irritability or tiredness. A fever or  chills. Diarrhea. The first symptom in older adults may be confusion. In some cases, they may not have any symptoms until the infection has worsened. How is this diagnosed? This condition is diagnosed based on your medical history and a physical exam. You may also have other tests, including: Urine tests. Blood tests. Tests for STIs (sexually transmitted infections). If you have had more than one UTI, a cystoscopy or imaging studies may be done to determine the cause of the infections. How is this treated? Treatment for this condition includes: Antibiotic medicine. Over-the-counter medicines to treat discomfort. Drinking enough water to stay hydrated. If you have frequent infections or have other conditions such as a kidney stone, you may need to see a health care provider who specializes in the urinary tract (urologist). In rare cases, urinary tract infections can cause sepsis. Sepsis is a life-threatening condition that occurs when the body responds to an infection. Sepsis is treated in the hospital with IV antibiotics, fluids, and other medicines. Follow these instructions at home:  Medicines Take over-the-counter and prescription medicines only as told by your health care provider. If you were prescribed an antibiotic medicine, take it as told by your health care provider. Do not stop using the antibiotic even if you start to feel better. General instructions Make sure you: Empty your bladder often and completely. Do not hold urine for long periods of time. Empty your bladder after sex. Wipe from front to back after urinating or having a bowel movement if you are female. Use each tissue only one time when you wipe. Drink enough fluid to keep your urine pale yellow. Keep all follow-up visits. This is important. Contact a health   care provider if: Your symptoms do not get better after 1-2 days. Your symptoms go away and then return. Get help right away if: You have severe pain in  your back or your lower abdomen. You have a fever or chills. You have nausea or vomiting. Summary A urinary tract infection (UTI) is an infection of any part of the urinary tract, which includes the kidneys, ureters, bladder, and urethra. Most urinary tract infections are caused by bacteria in your genital area. Treatment for this condition often includes antibiotic medicines. If you were prescribed an antibiotic medicine, take it as told by your health care provider. Do not stop using the antibiotic even if you start to feel better. Keep all follow-up visits. This is important. This information is not intended to replace advice given to you by your health care provider. Make sure you discuss any questions you have with your health care provider. Document Revised: 09/08/2019 Document Reviewed: 09/08/2019 Elsevier Patient Education  2023 [employer] Inc[employer]  

## 2021-06-13 LAB — URINE CULTURE
MICRO NUMBER:: 13353123
SPECIMEN QUALITY:: ADEQUATE

## 2021-06-16 ENCOUNTER — Other Ambulatory Visit: Payer: Self-pay | Admitting: Family

## 2021-06-16 DIAGNOSIS — E785 Hyperlipidemia, unspecified: Secondary | ICD-10-CM

## 2021-07-13 ENCOUNTER — Other Ambulatory Visit: Payer: Self-pay | Admitting: Family

## 2021-07-13 DIAGNOSIS — I10 Essential (primary) hypertension: Secondary | ICD-10-CM

## 2021-07-17 ENCOUNTER — Telehealth: Payer: Self-pay | Admitting: *Deleted

## 2021-07-17 NOTE — Telephone Encounter (Signed)
Patient daughter, Judeen Hammans called and stated that she was not aware that we changed patient's cholesterol medication, Simvastatin. I reviewed medication list and Simvastatin is still listed as a current medication. Daughter stated that patient is now receiving a "Blue Pill" not the normal cholesterol medication. Stated that since taking this pill patient cannot sleep.   Instructed daughter to take the medication back to the pharmacy to make sure this is the correct medication and if correct instructed her to call our office to schedule an appointment due to change in condition, so we can review all medications.   Daughter agreed.   FYI

## 2021-07-21 NOTE — Telephone Encounter (Signed)
No changes made on patient's cholesterol medication.I agree to verify with pharmacy on the correct medication.

## 2021-07-29 ENCOUNTER — Ambulatory Visit: Payer: Medicare Other | Admitting: Internal Medicine

## 2021-07-30 ENCOUNTER — Ambulatory Visit (INDEPENDENT_AMBULATORY_CARE_PROVIDER_SITE_OTHER): Payer: Medicare Other | Admitting: Internal Medicine

## 2021-07-30 ENCOUNTER — Encounter: Payer: Self-pay | Admitting: Internal Medicine

## 2021-07-30 VITALS — BP 160/90 | HR 94 | Ht 61.0 in | Wt 171.2 lb

## 2021-07-30 DIAGNOSIS — R0609 Other forms of dyspnea: Secondary | ICD-10-CM

## 2021-07-30 NOTE — Patient Instructions (Signed)
Medication Instructions:  Your physician recommends that you continue on your current medications as directed. Please refer to the Current Medication list given to you today.  *If you need a refill on your cardiac medications before your next appointment, please call your pharmacy*  Follow-Up: At [facility], you and your health needs are our priority.  As part of our continuing mission to provide you with exceptional heart care, we have created designated Provider Care Teams.  These Care Teams include your primary Cardiologist (physician) and Advanced Practice Providers (APPs -  Physician Assistants and Nurse Practitioners) who all work together to provide you with the care you need, when you need it.  We recommend signing up for the patient portal called "MyChart".  Sign up information is provided on this After Visit Summary.  MyChart is used to connect with patients for Virtual Visits (Telemedicine).  Patients are able to view lab/test results, encounter notes, upcoming appointments, etc.  Non-urgent messages can be sent to your provider as well.   To learn more about what you can do with MyChart, go to [email or web address].    Your next appointment:   6 month(s)  The format for your next appointment:   In Person  Provider:   Maisie Fus, MD {    Important Information About Sugar

## 2021-07-30 NOTE — Progress Notes (Signed)
Cardiology Office Note:    Date:  07/30/2021   ID:  Ashley Johns, DOB 1936-01-24, MRN [id number]  PCP:  Ashley Bookman, NP   [facility] Providers Cardiologist:  Ashley Fus, MD     Referring MD: Ashley Bookman, NP   No chief complaint on file. DOE  History of Present Illness:    Ashley Johns is a 86 y.o. female with a hx of graves dx s/p thyroidecotmy, htn referral from Ashley Johns referral for SOB for 2-3 months  She notes SOB with walking upstairs. She then rests. Just walking to the garbage can she gets SOB. She did note some chest pressure with walking. Last week it was worse. She's had a lot of coughing. Daughter notes she has seasonal allergies. No LE edema. She's lost weight. She denies orthopnea, PND.  Has not seen a cardiologist. No hx of stress test.  No cath. TSH is normal.  Former smoker. She started in her 20s-30s.   She comes in with her daughter. They are from here. She lived in [place] briefly many years ago  EKG 04/23/2021-NSR,PACs  Interim Hx: For DOE, spect myoview was completed which was low risk. Her TTE showed EF 50-55, RV was normal, mild-moderate MR (no intrinsic valve dx), Aortic mean gradient 10 mmHg with a calcified valve. Blood pressure elevated today 160/90 ; typically systolics < 140 mmHg and normal DBP. She was stressed this AM. She still has SOB. She denies PND, orthopnea, no LE edema. If she travels at a fast pace with her mask that's when she notes it. If she walks normally it is fine. She can get from her car to church without issues. [facility].    Past Medical History:  Diagnosis Date   Eczema    Per [facility] new patient packet   Graves disease    Per [facility] new patient packet   High blood pressure    Per [facility] new patient packet   Hypertension     Past Surgical History:  Procedure Laterality Date   TOTAL ABDOMINAL HYSTERECTOMY Bilateral 1970's   TUBAL LIGATION      Current  Medications: Current Meds  Medication Sig   acetaminophen (TYLENOL) 650 MG CR tablet Take 650 mg by mouth every 8 (eight) hours as needed for pain.   carvedilol (COREG) 25 MG tablet Take 1 tablet (25 mg total) by mouth 2 (two) times daily.   cetirizine (ZYRTEC) 10 MG tablet Take 10 mg by mouth as needed for allergies.   EPINEPHrine (EPIPEN 2-PAK) 0.3 mg/0.3 mL IJ SOAJ injection Inject 0.3 mg into the muscle as needed. Inject 0.3mg  intramuscular route once as needed.   fluticasone (FLONASE) 50 MCG/ACT nasal spray Place 1 spray into both nostrils 2 (two) times daily as needed (nasal congestion).   ipratropium (ATROVENT) 0.03 % nasal spray USE 2 SPRAYS IN EACH NOSTRIL TWICE DAILY   mometasone (ELOCON) 0.1 % cream Apply 1 application. topically as needed.   olmesartan (BENICAR) 40 MG tablet Take 40 mg by mouth daily.   polyethylene glycol powder (GLYCOLAX/MIRALAX) 17 GM/SCOOP powder Take 17 g by mouth 2 (two) times daily as needed.   simvastatin (ZOCOR) 20 MG tablet TAKE 1 TABLET(20 MG) BY MOUTH DAILY     Allergies:   Codeine and Tramadol   Social History   Socioeconomic History   Marital status: Widowed    Spouse name: Not on file   Number  of children: Not on file   Years of education: Not on file   Highest education level: Not on file  Occupational History   Not on file  Tobacco Use   Smoking status: Former   Smokeless tobacco: Never   Tobacco comments:    Smoked for 6 years, Quit at age 43  Vaping Use   Vaping Use: Never used  Substance and Sexual Activity   Alcohol use: Never   Drug use: Never   Sexual activity: Not Currently  Other Topics Concern   Not on file  Social History Narrative   Diet      Do you drink/eat things with caffeine: Yes      Marital Status: Widowed  What year were you married? 1972      Do you live in a house, apartment, assisted living, condo, trailer, etc.? Townhouse      Is it one or more stories? 2      How many persons live in your home?  3         Do you have any pets in your home?(please list): Yes, dog and fish      Highest level of education completed: High School      Current or past profession: Day care owner, foster parent      Do you exercise?: sometimes Type and how often: Walking      Living Will? No      DNR form? No  If not, do you wish to discuss one?: Yes      POA/HPOA forms? No      Difficulty bathing or dressing yourself? No      Difficulty preparing food or eating? No      Difficulty managing medications? No      Difficulty managing your finances? No      Difficulty affording your medications? No                     Social Determinants of Health   Financial Resource Strain: Low Risk  (12/09/2017)   Overall Financial Resource Strain (CARDIA)    Difficulty of Paying Living Expenses: Not hard at all  Food Insecurity: No Food Insecurity (12/09/2017)   Hunger Vital Sign    Worried About Running Out of Food in the Last Year: Never true    Ran Out of Food in the Last Year: Never true  Transportation Needs: No Transportation Needs (12/09/2017)   PRAPARE - Administrator, Civil Service (Medical): No    Lack of Transportation (Non-Medical): No  Physical Activity: Unknown (09/08/2018)   Exercise Vital Sign    Days of Exercise per Week: 1 day    Minutes of Exercise per Session: Not on file  Stress: No Stress Concern Present (12/09/2017)   [employer] of Occupational Health - Occupational Stress Questionnaire    Feeling of Stress : Not at all  Social Connections: Not on file     Family History: The patient's family history includes Hypertension in her father; Tuberculosis in her mother.  ROS:   Please see the history of present illness.     All other systems reviewed and are negative.  EKGs/Labs/Other Studies Reviewed:    The following studies were reviewed today:   EKG:  EKG is  ordered today.  The ekg ordered today demonstrates   05/01/2021- NSR, PACs  Recent  Labs: 02/21/2021: ALT 11; BUN 18; Creat 0.90; Hemoglobin 13.1; Platelets 267; Potassium 4.5; Sodium 142; TSH 1.01  Recent Lipid Panel    Component Value Date/Time   CHOL 159 02/21/2021 1034   TRIG 77 02/21/2021 1034   HDL 57 02/21/2021 1034   CHOLHDL 2.8 02/21/2021 1034   LDLCALC 85 02/21/2021 1034     Risk Assessment/Calculations:           Physical Exam:    VS:  Vitals:   07/30/21 0925  BP: (!) 160/90  Pulse: 94  SpO2: 96%     Wt Readings from Last 3 Encounters:  07/30/21 171 lb 3.2 oz (77.7 kg)  06/05/21 170 lb (77.1 kg)  05/21/21 171 lb (77.6 kg)     GEN:  Well nourished, well developed in no acute distress HEENT: Normal NECK: No JVD; No carotid bruits LYMPHATICS: No lymphadenopathy CARDIAC: RRR, SEM RUSB, rubs, gallops RESPIRATORY:  Clear to auscultation without rales, wheezing or rhonchi  ABDOMEN: Soft, non-tender, non-distended MUSCULOSKELETAL:  No edema; No deformity  SKIN: Warm and dry NEUROLOGIC:  Alert and oriented x 3 PSYCHIATRIC:  Normal affect   ASSESSMENT:    #Aortic Stenosis/Mild-Moderate: She had normal SPECT. Echo showed mild-moderate MR and mild AS. E/e' 11 mildly elevated. Suspect this is related to her aortic stenosis. She does not report any syncopal events. Her mean gradient was 10 mmHg but the valve is calcified; may not have captured the true peak velocity. We discussed DOE may be related to deconditioning, however the valve can contribute. Will plan to follow up imaging and encourage using the right parasternal window with a pedoff probe to better assess velocities  #Mild-Moderate MR: follow up echo surveillance as above.  #HTN: typically controlled at home. Will stop atenolol and start carvediolol 25 mg BID. Continue olmesartan 40 mg daily.   PLAN:    In order of problems listed above:  Follow up in 6 months     Shared Decision Making/Informed Consent The risks [chest pain, shortness of breath, cardiac arrhythmias, dizziness,  blood pressure fluctuations, myocardial infarction, stroke/transient ischemic attack, nausea, vomiting, allergic reaction, radiation exposure, metallic taste sensation and life-threatening complications (estimated to be 1 in 10,000)], benefits (risk stratification, diagnosing coronary artery disease, treatment guidance) and alternatives of a nuclear stress test were discussed in detail with Ms. Doddridge and she agrees to proceed.    Medication Adjustments/Labs and Tests Ordered: Current medicines are reviewed at length with the patient today.  Concerns regarding medicines are outlined above.  No orders of the defined types were placed in this encounter.  No orders of the defined types were placed in this encounter.   Patient Instructions  Medication Instructions:  Your physician recommends that you continue on your current medications as directed. Please refer to the Current Medication list given to you today.  *If you need a refill on your cardiac medications before your next appointment, please call your pharmacy*  Follow-Up: At [facility], you and your health needs are our priority.  As part of our continuing mission to provide you with exceptional heart care, we have created designated Provider Care Teams.  These Care Teams include your primary Cardiologist (physician) and Advanced Practice Providers (APPs -  Physician Assistants and Nurse Practitioners) who all work together to provide you with the care you need, when you need it.  We recommend signing up for the patient portal called "MyChart".  Sign up information is provided on this After Visit Summary.  MyChart is used to connect with patients for Virtual Visits (Telemedicine).  Patients are able to view lab/test results, encounter notes, upcoming  appointments, etc.  Non-urgent messages can be sent to your provider as well.   To learn more about what you can do with MyChart, go to [email or web address].    Your next appointment:    6 month(s)  The format for your next appointment:   In Person  Provider:   Maisie Fus, MD {    Important Information About Sugar         Signed, Ashley Fus, MD  07/30/2021 10:53 AM    [facility]

## 2021-08-14 ENCOUNTER — Telehealth: Payer: Self-pay

## 2021-08-14 NOTE — Telephone Encounter (Signed)
Patient called stating that she wears depends however, she has been having a hard time at holding in any urine and is going much more often. This results to her having to change her depends much more frequently and not being able to make it to the bathroom. Patient was wondering if there was a pill she could take to help control her bladder?   Please advise.

## 2021-08-14 NOTE — Telephone Encounter (Signed)
Scheduled patient for in office visit on 08/15/21 to check for signs of urine infection.

## 2021-08-14 NOTE — Telephone Encounter (Signed)
Please schedule appointment to check urine for signs of infection which can cause urine frequency.

## 2021-08-15 ENCOUNTER — Encounter: Payer: Self-pay | Admitting: Family

## 2021-08-15 ENCOUNTER — Ambulatory Visit (INDEPENDENT_AMBULATORY_CARE_PROVIDER_SITE_OTHER): Payer: Medicare Other | Admitting: Family

## 2021-08-15 VITALS — BP 150/84 | HR 86 | Temp 97.8°F | Resp 16 | Ht 61.0 in | Wt 173.0 lb

## 2021-08-15 DIAGNOSIS — R350 Frequency of micturition: Secondary | ICD-10-CM | POA: Diagnosis not present

## 2021-08-15 DIAGNOSIS — J301 Allergic rhinitis due to pollen: Secondary | ICD-10-CM | POA: Diagnosis not present

## 2021-08-15 LAB — POCT URINALYSIS DIPSTICK
Bilirubin, UA: NEGATIVE
Glucose, UA: NEGATIVE
Ketones, UA: POSITIVE
Nitrite, UA: NEGATIVE
Protein, UA: NEGATIVE
Spec Grav, UA: 1.02 (ref 1.010–1.025)
Urobilinogen, UA: NEGATIVE E.U./dL — AB
pH, UA: 7 (ref 5.0–8.0)

## 2021-08-15 MED ORDER — IPRATROPIUM BROMIDE 0.03 % NA SOLN: 2.0000 | Freq: Two times a day (BID) | NASAL | 3 refills | Status: DC

## 2021-08-15 MED ORDER — CRANBERRY 475 MG PO CAPS: 475.0000 mg | ORAL_CAPSULE | Freq: Two times a day (BID) | ORAL | 0 refills | Status: DC

## 2021-08-15 NOTE — Patient Instructions (Signed)
Urinary Tract Infection, Adult  A urinary tract infection (UTI) is an infection of any part of the urinary tract. The urinary tract includes the kidneys, ureters, bladder, and urethra. These organs make, store, and get rid of urine in the body. An upper UTI affects the ureters and kidneys. A lower UTI affects the bladder and urethra. What are the causes? Most urinary tract infections are caused by bacteria in your genital area around your urethra, where urine leaves your body. These bacteria grow and cause inflammation of your urinary tract. What increases the risk? You are more likely to develop this condition if: You have a urinary catheter that stays in place. You are not able to control when you urinate or have a bowel movement (incontinence). You are female and you: Use a spermicide or diaphragm for birth control. Have low estrogen levels. Are pregnant. You have certain genes that increase your risk. You are sexually active. You take antibiotic medicines. You have a condition that causes your flow of urine to slow down, such as: An enlarged prostate, if you are female. Blockage in your urethra. A kidney stone. A nerve condition that affects your bladder control (neurogenic bladder). Not getting enough to drink, or not urinating often. You have certain medical conditions, such as: Diabetes. A weak disease-fighting system (immunesystem). Sickle cell disease. Gout. Spinal cord injury. What are the signs or symptoms? Symptoms of this condition include: Needing to urinate right away (urgency). Frequent urination. This may include small amounts of urine each time you urinate. Pain or burning with urination. Blood in the urine. Urine that smells bad or unusual. Trouble urinating. Cloudy urine. Vaginal discharge, if you are female. Pain in the abdomen or the lower back. You may also have: Vomiting or a decreased appetite. Confusion. Irritability or tiredness. A fever or  chills. Diarrhea. The first symptom in older adults may be confusion. In some cases, they may not have any symptoms until the infection has worsened. How is this diagnosed? This condition is diagnosed based on your medical history and a physical exam. You may also have other tests, including: Urine tests. Blood tests. Tests for STIs (sexually transmitted infections). If you have had more than one UTI, a cystoscopy or imaging studies may be done to determine the cause of the infections. How is this treated? Treatment for this condition includes: Antibiotic medicine. Over-the-counter medicines to treat discomfort. Drinking enough water to stay hydrated. If you have frequent infections or have other conditions such as a kidney stone, you may need to see a health care provider who specializes in the urinary tract (urologist). In rare cases, urinary tract infections can cause sepsis. Sepsis is a life-threatening condition that occurs when the body responds to an infection. Sepsis is treated in the hospital with IV antibiotics, fluids, and other medicines. Follow these instructions at home:  Medicines Take over-the-counter and prescription medicines only as told by your health care provider. If you were prescribed an antibiotic medicine, take it as told by your health care provider. Do not stop using the antibiotic even if you start to feel better. General instructions Make sure you: Empty your bladder often and completely. Do not hold urine for long periods of time. Empty your bladder after sex. Wipe from front to back after urinating or having a bowel movement if you are female. Use each tissue only one time when you wipe. Drink enough fluid to keep your urine pale yellow. Keep all follow-up visits. This is important. Contact a health   care provider if: Your symptoms do not get better after 1-2 days. Your symptoms go away and then return. Get help right away if: You have severe pain in  your back or your lower abdomen. You have a fever or chills. You have nausea or vomiting. Summary A urinary tract infection (UTI) is an infection of any part of the urinary tract, which includes the kidneys, ureters, bladder, and urethra. Most urinary tract infections are caused by bacteria in your genital area. Treatment for this condition often includes antibiotic medicines. If you were prescribed an antibiotic medicine, take it as told by your health care provider. Do not stop using the antibiotic even if you start to feel better. Keep all follow-up visits. This is important. This information is not intended to replace advice given to you by your health care provider. Make sure you discuss any questions you have with your health care provider. Document Revised: 09/08/2019 Document Reviewed: 09/08/2019 Elsevier Patient Education  2023 [employer] Inc[employer]  

## 2021-08-15 NOTE — Progress Notes (Signed)
Provider: Richarda Blade FNP-C  Raisha Brabender, Donalee Citrin, NP  Patient Care Team: Kayra Crowell, Donalee Citrin, NP as PCP - General (Family Medicine) Maisie Fus, MD as PCP - Cardiology (Cardiology) Aris Lot, MD as Consulting Physician (Dermatology)  Extended Emergency Contact Information Primary Emergency Contact: Terance Hart [place] Phone: [phone] Relation: Daughter Secondary Emergency Contact: booker,melonie [place] Phone: [phone] Relation: Granddaughter  Code Status: Full Code  Goals of care: Advanced Directive information    08/15/2021    9:49 AM  Advanced Directives  Does Patient Have a Medical Advance Directive? Yes  Type of Estate agent of [place];Living will  Does patient want to make changes to medical advance directive? No - Patient declined  Copy of Healthcare Power of Attorney in Chart? No - copy requested  Would patient like information on creating a medical advance directive? No - Patient declined     Chief Complaint  Patient presents with   Acute Visit    Patient complains of urinary frequency for 2 weeks.    HPI:  Pt is a 86 y.o. female seen today for an acute visit for evaluation of urine urgency and frequency x 2 weeks.denies any fever,chills,nausea,vomiting,abdominal pain,flank pain,urgency,frequency,dysuria,difficult urination or hematuria.    Past Medical History:  Diagnosis Date   Eczema    Per [facility] new patient packet   Graves disease    Per [facility] new patient packet   High blood pressure    Per [facility] new patient packet   Hypertension    Past Surgical History:  Procedure Laterality Date   TOTAL ABDOMINAL HYSTERECTOMY Bilateral 1970's   TUBAL LIGATION      Allergies  Allergen Reactions   Codeine Nausea Only    unknown   Tramadol Nausea Only    Outpatient Encounter Medications as of 08/15/2021  Medication Sig   acetaminophen (TYLENOL) 650 MG CR tablet Take 650 mg by mouth every 8  (eight) hours as needed for pain.   carvedilol (COREG) 25 MG tablet Take 1 tablet (25 mg total) by mouth 2 (two) times daily.   cetirizine (ZYRTEC) 10 MG tablet Take 10 mg by mouth as needed for allergies.   EPINEPHrine (EPIPEN 2-PAK) 0.3 mg/0.3 mL IJ SOAJ injection Inject 0.3 mg into the muscle as needed. Inject 0.3mg  intramuscular route once as needed.   fluticasone (FLONASE) 50 MCG/ACT nasal spray Place 1 spray into both nostrils 2 (two) times daily as needed (nasal congestion).   ipratropium (ATROVENT) 0.03 % nasal spray USE 2 SPRAYS IN EACH NOSTRIL TWICE DAILY   mometasone (ELOCON) 0.1 % cream Apply 1 application. topically as needed.   olmesartan (BENICAR) 40 MG tablet Take 40 mg by mouth daily.   polyethylene glycol powder (GLYCOLAX/MIRALAX) 17 GM/SCOOP powder Take 17 g by mouth 2 (two) times daily as needed.   simvastatin (ZOCOR) 20 MG tablet TAKE 1 TABLET(20 MG) BY MOUTH DAILY   No facility-administered encounter medications on file as of 08/15/2021.    Review of Systems  Constitutional:  Negative for appetite change, chills, fatigue, fever and unexpected weight change.  Eyes:  Negative for pain, discharge, redness, itching and visual disturbance.  Respiratory:  Negative for cough, chest tightness, shortness of breath and wheezing.   Cardiovascular:  Negative for chest pain, palpitations and leg swelling.  Gastrointestinal:  Negative for abdominal distention, abdominal pain, blood in stool, constipation, diarrhea, nausea and vomiting.  Genitourinary:  Positive for frequency and urgency. Negative for difficulty urinating, dysuria and flank pain.  Musculoskeletal:  Negative for arthralgias, back pain, gait problem, joint swelling, myalgias, neck pain and neck stiffness.  Skin:  Negative for color change, pallor, rash and wound.  Neurological:  Negative for dizziness, syncope, speech difficulty, weakness, light-headedness, numbness and headaches.  Hematological:  Does not bruise/bleed  easily.  Psychiatric/Behavioral:  Negative for agitation, behavioral problems, confusion, hallucinations, self-injury, sleep disturbance and suicidal ideas. The patient is not nervous/anxious.     Immunization History  Administered Date(s) Administered   Fluad Quad(high Dose 65+) 10/11/2018   Influenza, High Dose Seasonal PF 12/03/2015, 11/07/2019   Influenza,inj,Quad PF,6+ Mos 10/11/2018   Influenza-Unspecified 11/16/2017, 11/27/2020   PFIZER Comirnaty(Gray Top)Covid-19 Tri-Sucrose Vaccine 07/31/2020   PFIZER(Purple Top)SARS-COV-2 Vaccination 02/18/2019, 03/11/2019, 11/29/2019, 07/31/2020   Pneumococcal Conjugate-13 10/11/2018   Td 10/11/2018   Tdap 10/11/2018   Pertinent  Health Maintenance Due  Topic Date Due   INFLUENZA VACCINE  09/09/2021   DEXA SCAN  Completed      08/07/2020    2:25 PM 08/21/2020   10:08 AM 02/21/2021    9:33 AM 06/12/2021   10:43 AM 08/15/2021    9:49 AM  Fall Risk  Falls in the past year? 0 0 0 0 0  Was there an injury with Fall? 0 0 0 0 0  Fall Risk Category Calculator 0 0 0 0 0  Fall Risk Category Low Low Low Low Low  Patient Fall Risk Level Low fall risk Low fall risk Low fall risk Low fall risk Low fall risk  Patient at Risk for Falls Due to No Fall Risks No Fall Risks No Fall Risks No Fall Risks No Fall Risks  Fall risk Follow up Falls evaluation completed Falls evaluation completed Falls evaluation completed Falls evaluation completed Falls evaluation completed   Functional Status Survey:    Vitals:   08/15/21 0945  BP: (!) 150/84  Pulse: 86  Resp: 16  Temp: 97.8 F (36.6 C)  SpO2: 94%  Weight: 173 lb (78.5 kg)  Height: 5\' 1"  (1.549 m)   Body mass index is 32.69 kg/m. Physical Exam Vitals reviewed.  Constitutional:      General: She is not in acute distress.    Appearance: Normal appearance. She is normal weight. She is not ill-appearing or diaphoretic.  HENT:     Head: Normocephalic.     Mouth/Throat:     Mouth: Mucous membranes  are moist.     Pharynx: Oropharynx is clear. No oropharyngeal exudate or posterior oropharyngeal erythema.  Eyes:     General: No scleral icterus.       Right eye: No discharge.        Left eye: No discharge.     Conjunctiva/sclera: Conjunctivae normal.     Pupils: Pupils are equal, round, and reactive to light.  Cardiovascular:     Rate and Rhythm: Normal rate and regular rhythm.     Pulses: Normal pulses.     Heart sounds: Normal heart sounds. No murmur heard.    No friction rub. No gallop.  Pulmonary:     Effort: Pulmonary effort is normal. No respiratory distress.     Breath sounds: Normal breath sounds. No wheezing, rhonchi or rales.  Chest:     Chest wall: No tenderness.  Abdominal:     General: Bowel sounds are normal. There is no distension.     Palpations: Abdomen is soft. There is no mass.     Tenderness: There is no abdominal tenderness. There is no right CVA tenderness, left  CVA tenderness, guarding or rebound.  Musculoskeletal:        General: No swelling or tenderness. Normal range of motion.     Right lower leg: No edema.     Left lower leg: No edema.  Skin:    General: Skin is warm and dry.     Coloration: Skin is not pale.     Findings: No erythema or rash.  Neurological:     Mental Status: She is alert and oriented to person, place, and time.     Motor: No weakness.     Gait: Gait normal.  Psychiatric:        Mood and Affect: Mood normal.        Speech: Speech normal.        Behavior: Behavior normal.        Thought Content: Thought content normal.        Judgment: Judgment normal.     Labs reviewed: Recent Labs    08/21/20 1056 02/21/21 1034  NA 142 142  K 4.3 4.5  CL 106 106  CO2 27 28  GLUCOSE 89 84  BUN 19 18  CREATININE 0.91 0.90  CALCIUM 9.2 9.3   Recent Labs    08/21/20 1056 02/21/21 1034  AST 17 15  ALT 13 11  BILITOT 0.5 0.4  PROT 7.4 7.5   Recent Labs    08/21/20 1056 02/21/21 1034  WBC 6.1 5.8  NEUTROABS 4,532 3,845   HGB 13.1 13.1  HCT 41.0 40.3  MCV 91.5 90.6  PLT 273 267   Lab Results  Component Value Date   TSH 1.01 02/21/2021   Lab Results  Component Value Date   HGBA1C 6.0 (H) 02/21/2021   Lab Results  Component Value Date   CHOL 159 02/21/2021   HDL 57 02/21/2021   LDLCALC 85 02/21/2021   TRIG 77 02/21/2021   CHOLHDL 2.8 02/21/2021    Significant Diagnostic Results in last 30 days:  No results found.  Assessment/Plan 1. Urinary frequency Afebrile - POC Urinalysis Dipstick indicates yellow clear urine, positive for ketones, moderate blood and large 3+ leukocytes but negative for nitrites. Results discussed with patient would like to wait till final cultures are resulted.  Start on cranberry pill as below. -Additional educational information provided on AVS on urinary tract infection - Culture, Urine - Cranberry 475 MG CAPS; Take 1 capsule (475 mg total) by mouth 2 (two) times daily.  Dispense: 60 capsule; Refill: 0  2. Non-seasonal allergic rhinitis due to pollen Requesting refills for ipratropium Symptoms controlled - ipratropium (ATROVENT) 0.03 % nasal spray; Place 2 sprays into both nostrils 2 (two) times daily.  Dispense: 30 mL; Refill: 3  Family/ staff Communication: Reviewed plan of care with patient verbalized understanding  Labs/tests ordered:  - Urine Culture; Future - POC Urinalysis Dipstick  Next Appointment. Return if symptoms worsen or fail to improve.     Caesar Bookman, NP

## 2021-08-17 LAB — URINE CULTURE
MICRO NUMBER:: 13619224
SPECIMEN QUALITY:: ADEQUATE

## 2021-08-18 ENCOUNTER — Other Ambulatory Visit: Payer: Self-pay

## 2021-08-18 MED ORDER — CIPROFLOXACIN HCL 500 MG PO TABS: 500.0000 mg | ORAL_TABLET | Freq: Two times a day (BID) | ORAL | 0 refills | Status: AC

## 2021-08-18 MED ORDER — SACCHAROMYCES BOULARDII 250 MG PO CAPS: 250.0000 mg | ORAL_CAPSULE | Freq: Two times a day (BID) | ORAL | 0 refills | Status: DC

## 2021-08-21 ENCOUNTER — Ambulatory Visit: Payer: Medicare Other | Admitting: Family

## 2021-08-22 ENCOUNTER — Encounter: Payer: Self-pay | Admitting: Family

## 2021-08-22 ENCOUNTER — Encounter: Payer: Medicare Other | Admitting: Family

## 2021-08-26 ENCOUNTER — Encounter: Payer: Self-pay | Admitting: Family

## 2021-08-26 ENCOUNTER — Telehealth: Payer: Self-pay | Admitting: *Deleted

## 2021-08-26 ENCOUNTER — Ambulatory Visit (INDEPENDENT_AMBULATORY_CARE_PROVIDER_SITE_OTHER): Payer: Medicare Other | Admitting: Family

## 2021-08-26 VITALS — BP 110/88 | HR 80 | Temp 96.8°F | Resp 16 | Ht 61.0 in | Wt 173.4 lb

## 2021-08-26 DIAGNOSIS — I10 Essential (primary) hypertension: Secondary | ICD-10-CM | POA: Diagnosis not present

## 2021-08-26 DIAGNOSIS — E785 Hyperlipidemia, unspecified: Secondary | ICD-10-CM | POA: Diagnosis not present

## 2021-08-26 DIAGNOSIS — L308 Other specified dermatitis: Secondary | ICD-10-CM | POA: Diagnosis not present

## 2021-08-26 DIAGNOSIS — R7303 Prediabetes: Secondary | ICD-10-CM

## 2021-08-26 MED ORDER — OLMESARTAN MEDOXOMIL 40 MG PO TABS: 40.0000 mg | ORAL_TABLET | Freq: Every day | ORAL | 1 refills | Status: DC

## 2021-08-26 MED ORDER — MOMETASONE FUROATE 0.1 % EX CREA: 1.0000 | TOPICAL_CREAM | Freq: Every day | CUTANEOUS | 3 refills | Status: DC | PRN

## 2021-08-26 MED ORDER — MOMETASONE FUROATE 0.1 % EX CREA: 1.0000 | TOPICAL_CREAM | CUTANEOUS | 3 refills | Status: DC | PRN

## 2021-08-26 NOTE — Progress Notes (Signed)
Provider: Marlowe Sax FNP-C   Ngetich, Nelda Bucks, NP  Patient Care Team: Ngetich, Nelda Bucks, NP as PCP - General (Family Medicine) [name], MD as PCP - Cardiology (Cardiology) Harriett Sine, MD as Consulting Physician (Dermatology)  Extended Emergency Contact Information Primary Emergency Contact: [name] [place] Phone: [phone] Relation: Daughter Secondary Emergency Contact: booker,melonie [place] Phone: [phone] Relation: Granddaughter  Code Status:  Full Code  Goals of care: Advanced Directive information    08/26/2021    9:29 AM  Advanced Directives  Does Patient Have a Medical Advance Directive? Yes  Type of Paramedic of [place];Living will  Does patient want to make changes to medical advance directive? No - Patient declined  Copy of [place] in Chart? No - copy requested     Chief Complaint  Patient presents with   Medical Management of Chronic Issues    6 month follow up.   Labs     Fasting Labs.   Health Maintenance    Discuss the need for Bone Density.   Immunizations    Discuss the need for Shingrix vaccine, Pne vaccine, and Covid Booster.    HPI:  Pt is a 86 y.o. female seen today for  6 months for medical management of chronic diseases.  Has a medical history essential hypertension, hyperlipidemia, asthma, eczema, prediabetes among other conditions.  She is here today with her daughter.  She denies any new acute issues Has gained 2 pounds since last visit.  Daughter concerned that she keeps snacks in her room with her large jar of candy. Also stated likes to eat cookies.  We will discuss to cut down on the carbohydrates and sweets. Daughter and son will ensure that they do not by the sugary foods to encourage her to eat healthy. States resolved recently lost hearing aids does not recall whether she could have dropped them since she wears eyeglasses, tends to fall  off whenever she removes them. She is due for Shingrix pneumonia and COVID booster vaccine advised to get vaccine at the pharmacy.  She is scheduled for bone density in December 02, 2021.  No recent fall episodes or fractures.  Currently on last doses of Cipro for recent urinary tract infection which indicated E. coli.  Discussed probable care during visit.  Blood pressure slightly high on arrival rechecked has improved.  States home blood pressures running in the 110s over 80s.has not been taking olmesartan 40 mg as prescribed by cardiologist.  Advised to take medication as directed..  Past Medical History:  Diagnosis Date   Eczema    Per [place] new patient packet   Graves disease    Per [place] new patient packet   High blood pressure    Per [facility] new patient packet   Hypertension    Past Surgical History:  Procedure Laterality Date   TOTAL ABDOMINAL HYSTERECTOMY Bilateral 1970's   TUBAL LIGATION      Allergies  Allergen Reactions   Codeine Nausea Only    unknown   Tramadol Nausea Only    Allergies as of 08/26/2021       Reactions   Codeine Nausea Only   unknown   Tramadol Nausea Only        Medication List        Accurate as of August 26, 2021 10:04 AM. If you have any questions, ask your nurse or doctor.          acetaminophen  650 MG CR tablet Commonly known as: TYLENOL Take 650 mg by mouth every 8 (eight) hours as needed for pain.   carvedilol 25 MG tablet Commonly known as: COREG Take 1 tablet (25 mg total) by mouth 2 (two) times daily.   cetirizine 10 MG tablet Commonly known as: ZYRTEC Take 10 mg by mouth as needed for allergies.   Cranberry 475 MG Caps Take 1 capsule (475 mg total) by mouth 2 (two) times daily.   EPINEPHrine 0.3 mg/0.3 mL Soaj injection Commonly known as: EpiPen 2-Pak Inject 0.3 mg into the muscle as needed. Inject 0.37m intramuscular route once as needed.   fluticasone 50 MCG/ACT nasal spray Commonly known as: FLONASE Place 1  spray into both nostrils 2 (two) times daily as needed (nasal congestion).   ipratropium 0.03 % nasal spray Commonly known as: ATROVENT Place 2 sprays into both nostrils 2 (two) times daily.   mometasone 0.1 % cream Commonly known as: ELOCON Apply 1 application. topically as needed.   olmesartan 40 MG tablet Commonly known as: BENICAR Take 40 mg by mouth daily.   polyethylene glycol powder 17 GM/SCOOP powder Commonly known as: GLYCOLAX/MIRALAX Take 17 g by mouth 2 (two) times daily as needed.   saccharomyces boulardii 250 MG capsule Commonly known as: Florastor Take 1 capsule (250 mg total) by mouth 2 (two) times daily. Take along with CIPRO 5078mto prevent antibiotics associated diarrhea   simvastatin 20 MG tablet Commonly known as: ZOCOR TAKE 1 TABLET(20 MG) BY MOUTH DAILY        Review of Systems  Constitutional:  Negative for appetite change, chills, fatigue, fever and unexpected weight change.  HENT:  Positive for hearing loss. Negative for congestion, dental problem, ear discharge, ear pain, nosebleeds, postnasal drip, rhinorrhea, sinus pressure, sinus pain, sneezing, sore throat, tinnitus and trouble swallowing.   Eyes:  Negative for pain, discharge, redness, itching and visual disturbance.  Respiratory:  Negative for cough, chest tightness, shortness of breath and wheezing.   Cardiovascular:  Negative for chest pain, palpitations and leg swelling.  Gastrointestinal:  Negative for abdominal distention, abdominal pain, blood in stool, constipation, diarrhea, nausea and vomiting.  Endocrine: Negative for cold intolerance, heat intolerance, polydipsia, polyphagia and polyuria.  Genitourinary:  Negative for difficulty urinating, dysuria, flank pain, frequency and urgency.  Musculoskeletal:  Negative for arthralgias, back pain, gait problem, joint swelling, myalgias, neck pain and neck stiffness.  Skin:  Negative for color change, pallor, rash and wound.  Neurological:   Negative for dizziness, syncope, speech difficulty, weakness, light-headedness, numbness and headaches.  Hematological:  Does not bruise/bleed easily.  Psychiatric/Behavioral:  Negative for agitation, behavioral problems, confusion, hallucinations and sleep disturbance. The patient is not nervous/anxious.     Immunization History  Administered Date(s) Administered   Fluad Quad(high Dose 65+) 10/11/2018   Influenza, High Dose Seasonal PF 12/03/2015, 11/07/2019   Influenza,inj,Quad PF,6+ Mos 10/11/2018   Influenza-Unspecified 11/16/2017, 11/27/2020   PFIZER Comirnaty(Gray Top)Covid-19 Tri-Sucrose Vaccine 07/31/2020   PFIZER(Purple Top)SARS-COV-2 Vaccination 02/18/2019, 03/11/2019, 11/29/2019, 07/31/2020   Pneumococcal Conjugate-13 10/11/2018   Td 10/11/2018   Tdap 10/11/2018   Pertinent  Health Maintenance Due  Topic Date Due   INFLUENZA VACCINE  09/09/2021   DEXA SCAN  Completed      02/21/2021    9:33 AM 06/12/2021   10:43 AM 08/15/2021    9:49 AM 08/22/2021    9:06 AM 08/26/2021    9:28 AM  Fall Risk  Falls in the past year? 0 0 0  0 0  Was there an injury with Fall? 0 0 0 0 0  Fall Risk Category Calculator 0 0 0 0 0  Fall Risk Category _0   Patient Fall Risk Level _1   Patient at Risk for Falls Due to _2   Fall risk Follow up _3    Functional Status Survey:    Vitals:   08/26/21 0924  BP: (!) 150/90  Pulse: 80  Resp: 16  Temp: (!) 96.8 F (36 C)  SpO2: 90%  Weight: 173 lb 6.4 oz (78.7 kg)  Height: 5' 1" (1.549 m)   Body mass index is 32.76 kg/m. Physical Exam Vitals reviewed.  Constitutional:      General: She is not in acute distress.    Appearance: Normal appearance. She is obese. She is  not ill-appearing or diaphoretic.  HENT:     Head: Normocephalic.     Right Ear: Tympanic membrane, ear canal and external ear normal. There is no impacted cerumen.     Left Ear: Tympanic membrane, ear canal and external ear normal. There is no impacted cerumen.     Nose: Nose normal. No congestion or rhinorrhea.     Mouth/Throat:     Mouth: Mucous membranes are moist.     Pharynx: Oropharynx is clear. No oropharyngeal exudate or posterior oropharyngeal erythema.  Eyes:     General: No scleral icterus.       Right eye: No discharge.        Left eye: No discharge.     Extraocular Movements: Extraocular movements intact.     Conjunctiva/sclera: Conjunctivae normal.     Pupils: Pupils are equal, round, and reactive to light.  Neck:     Vascular: No carotid bruit.  Cardiovascular:     Rate and Rhythm: Normal rate and regular rhythm.     Pulses: Normal pulses.     Heart sounds: Normal heart sounds. No murmur heard.    No friction rub. No gallop.  Pulmonary:     Effort: Pulmonary effort is normal. No respiratory distress.     Breath sounds: Normal breath sounds. No wheezing, rhonchi or rales.  Chest:     Chest wall: No tenderness.  Abdominal:     General: Bowel sounds are normal. There is no distension.     Palpations: Abdomen is soft. There is no mass.     Tenderness: There is no abdominal tenderness. There is no right CVA tenderness, left CVA tenderness, guarding or rebound.  Musculoskeletal:        General: No swelling or tenderness. Normal range of motion.     Cervical back: Normal range of motion. No rigidity or tenderness.     Right lower leg: No edema.     Left lower leg: No edema.  Lymphadenopathy:     Cervical: No cervical adenopathy.  Skin:    General: Skin is warm and dry.     Coloration: Skin is not pale.     Findings: No bruising, erythema, lesion or rash.  Neurological:     Mental Status: She is alert and oriented to person, place, and time.     Cranial Nerves: No  cranial nerve deficit.     Sensory: No sensory deficit.  Motor: No weakness.     Coordination: Coordination normal.     Gait: Gait normal.  Psychiatric:        Mood and Affect: Mood normal.        Speech: Speech normal.        Behavior: Behavior normal.        Thought Content: Thought content normal.        Judgment: Judgment normal.     Labs reviewed: Recent Labs    02/21/21 1034  NA 142  K 4.5  CL 106  CO2 28  GLUCOSE 84  BUN 18  CREATININE 0.90  CALCIUM 9.3   Recent Labs    02/21/21 1034  AST 15  ALT 11  BILITOT 0.4  PROT 7.5   Recent Labs    02/21/21 1034  WBC 5.8  NEUTROABS 3,845  HGB 13.1  HCT 40.3  MCV 90.6  PLT 267   Lab Results  Component Value Date   TSH 1.01 02/21/2021   Lab Results  Component Value Date   HGBA1C 6.0 (H) 02/21/2021   Lab Results  Component Value Date   CHOL 159 02/21/2021   HDL 57 02/21/2021   LDLCALC 85 02/21/2021   TRIG 77 02/21/2021   CHOLHDL 2.8 02/21/2021    Significant Diagnostic Results in last 30 days:  No results found.  Assessment/Plan 1. Essential hypertension Blood pressure slightly high on arrival daughter reports readings at home are within normal range.  Has not been taking her olmesartan Advised to restart on Olmesartan as prescribed by Cardiologist  -Continue on carvedilol 25 mg twice daily - TSH - COMPLETE METABOLIC PANEL WITH GFR - CBC with Differential/Platelet - olmesartan (BENICAR) 40 MG tablet; Take 1 tablet (40 mg total) by mouth daily.  Dispense: 90 tablet; Refill: 1  2. Hyperlipidemia LDL goal <100 LDL at goal -Continue dietary modification and exercise at least 3 times per week for 30 minutes -Continue on simvastatin - Lipid panel  3. Prediabetes Lab Results  Component Value Date   HGBA1C 6.0 (H) 02/21/2021  Dietary modification discussed at length and exercise. - Hemoglobin A1c  4. Other eczema We will set on the neck -We will refill mometasone  Family/ staff  Communication: Reviewed plan of care with patient and daughter verbalized understanding  Labs/tests ordered:  - CBC with Differential/Platelet - CMP with eGFR([facility]) - TSH - Hgb A1C - Lipid panel  Next Appointment : Return in about 6 months (around 02/26/2022) for medical mangement of chronic issues.Sandrea Hughs, NP

## 2021-08-26 NOTE — Telephone Encounter (Signed)
Apply mometasone 0.1 % cream topically daily as needed to affected areas.

## 2021-08-26 NOTE — Telephone Encounter (Signed)
Received fax from [facility] needing a Rx sent to them Specifying the Max number of times the Mometasone 0.1% cream can be used per day.   Pended Rx. Please Advise.

## 2021-08-27 LAB — COMPLETE METABOLIC PANEL WITH GFR
AG Ratio: 1.1 (calc) (ref 1.0–2.5)
ALT: 11 U/L (ref 6–29)
AST: 13 U/L (ref 10–35)
Albumin: 4.1 g/dL (ref 3.6–5.1)
Alkaline phosphatase (APISO): 98 U/L (ref 37–153)
BUN/Creatinine Ratio: 20 (calc) (ref 6–22)
BUN: 21 mg/dL (ref 7–25)
CO2: 30 mmol/L (ref 20–32)
Calcium: 9.6 mg/dL (ref 8.6–10.4)
Chloride: 103 mmol/L (ref 98–110)
Creat: 1.05 mg/dL — ABNORMAL HIGH (ref 0.60–0.95)
Globulin: 3.8 g/dL (calc) — ABNORMAL HIGH (ref 1.9–3.7)
Glucose, Bld: 99 mg/dL (ref 65–99)
Potassium: 5.1 mmol/L (ref 3.5–5.3)
Sodium: 139 mmol/L (ref 135–146)
Total Bilirubin: 0.5 mg/dL (ref 0.2–1.2)
Total Protein: 7.9 g/dL (ref 6.1–8.1)
eGFR: 52 mL/min/{1.73_m2} — ABNORMAL LOW (ref >=60)

## 2021-08-27 LAB — LIPID PANEL
Cholesterol: 173 mg/dL (ref <200)
HDL: 66 mg/dL (ref >=50)
LDL Cholesterol (Calc): 92 mg/dL (calc)
Non-HDL Cholesterol (Calc): 107 mg/dL (calc) (ref <130)
Total CHOL/HDL Ratio: 2.6 (calc) (ref <5.0)
Triglycerides: 67 mg/dL (ref <150)

## 2021-08-27 LAB — CBC WITH DIFFERENTIAL/PLATELET
Absolute Monocytes: 421 cells/uL (ref 200–950)
Basophils Absolute: 42 cells/uL (ref 0–200)
Basophils Relative: 0.8 %
Eosinophils Absolute: 140 cells/uL (ref 15–500)
Eosinophils Relative: 2.7 %
HCT: 41.7 % (ref 35.0–45.0)
Hemoglobin: 13.8 g/dL (ref 11.7–15.5)
Lymphs Abs: 1399 cells/uL (ref 850–3900)
MCH: 30.1 pg (ref 27.0–33.0)
MCHC: 33.1 g/dL (ref 32.0–36.0)
MCV: 91 fL (ref 80.0–100.0)
MPV: 9.6 fL (ref 7.5–12.5)
Monocytes Relative: 8.1 %
Neutro Abs: 3198 cells/uL (ref 1500–7800)
Neutrophils Relative %: 61.5 %
Platelets: 229 10*3/uL (ref 140–400)
RBC: 4.58 10*6/uL (ref 3.80–5.10)
RDW: 13.1 % (ref 11.0–15.0)
Total Lymphocyte: 26.9 %
WBC: 5.2 10*3/uL (ref 3.8–10.8)

## 2021-08-27 LAB — HEMOGLOBIN A1C
Hgb A1c MFr Bld: 5.8 % of total Hgb — ABNORMAL HIGH (ref <5.7)
Mean Plasma Glucose: 120 mg/dL
eAG (mmol/L): 6.6 mmol/L

## 2021-08-27 LAB — TSH: TSH: 1.02 mIU/L (ref 0.40–4.50)

## 2021-08-31 NOTE — Progress Notes (Signed)
  This encounter was created in error - please disregard. No show 

## 2021-09-03 NOTE — Progress Notes (Unsigned)
Follow Up Note  RE: Ashley Johns MRN: [id number] DOB: [birth year] Date of Office Visit: 09/04/2021  Referring provider: Caesar Bookman, NP Primary care provider: Caesar Bookman, NP  Chief Complaint: No chief complaint on file.  History of Present Illness: I had the pleasure of seeing Ashley Johns for a follow up visit at the [facility] on 09/03/2021. She is a 86 y.o. female, who is being followed for allergic rhinitis, rash, headache, dyspnea on exertion. Her previous allergy office visit was on 06/05/2021 with Dr. Selena Batten. Today is a regular follow up visit.  Other allergic rhinitis Perennial rhinitis x 4 years. Tried antihistamines and ipratropium with some benefit. No prior allergy testing/ENT evaluation.  Today's skin testing showed: Positive to dust mites and cockroaches.  Start environmental control measures as below. Try Allegra (fexofenadine), or Xyzal (levocetirizine) daily as needed. May switch antihistamines every few months. Use Flonase (fluticasone) nasal spray 1 spray per nostril twice a day as needed for nasal congestion.  Use Atrovent (ipratropium) 0.03% 1-2 sprays per nostril twice a day as needed for runny nose/drainage. Nasal saline spray (i.e., Simply Saline) or nasal saline lavage (i.e., NeilMed) is recommended as needed and prior to medicated nasal sprays.   Rash and other nonspecific skin eruption Rash x 2 years on arms and legs. Flares about once per month and uses mometasone prn with good benefit. Patient thought she saw dermatology in the past and was told to use eczema lotion. She's concerned about allergic triggers.  Today's skin testing showed: Positive to dust mites and cockroaches. Negative to select foods.  No active current flare but has hyperpigmented areas of healing.  Keep track of episodes and take pictures.  See below for proper skin care.  Make sure you are using things without fragrances and scents.    Generalized  headache Headaches in the mornings and snores at night. Denies sleep apnea. Follow up with PCP to rule out sleep apnea.    Dyspnea on exertion Uses albuterol prn with some benefit - mainly with exertion. Follows with cardiology. Today's spirometry showed: severe restrictive disease with 6% improvement in FEV1 post bronchodilator treatment. Clinically feeling improved.  Reviewed proper inhaler technique as she had poor technique. May use albuterol rescue inhaler 2 puffs every 4 to 6 hours as needed for shortness of breath, chest tightness, coughing, and wheezing. Monitor frequency of use.  Spacer given and demonstrated proper use with inhaler. Patient understood technique and all questions/concerned were addressed.    Return in about 3 months (around 09/04/2021).  Assessment and Plan: Ashley Johns is a 86 y.o. female with: No problem-specific Assessment & Plan notes found for this encounter.  No follow-ups on file.  No orders of the defined types were placed in this encounter.  Lab Orders  No laboratory test(s) ordered today    Diagnostics: Spirometry:  Tracings reviewed. Her effort: {Blank single:19197::"Good reproducible efforts.","It was hard to get consistent efforts and there is a question as to whether this reflects a maximal maneuver.","Poor effort, data can not be interpreted."} FVC: ***L FEV1: ***L, ***% predicted FEV1/FVC ratio: ***% Interpretation: {Blank single:19197::"Spirometry consistent with mild obstructive disease","Spirometry consistent with moderate obstructive disease","Spirometry consistent with severe obstructive disease","Spirometry consistent with possible restrictive disease","Spirometry consistent with mixed obstructive and restrictive disease","Spirometry uninterpretable due to technique","Spirometry consistent with normal pattern","No overt abnormalities noted given today's efforts"}.  Please see scanned spirometry results for details.  Skin Testing: {Blank  single:19197::"Select foods","Environmental allergy panel","Environmental allergy panel and select  foods","Food allergy panel","None","Deferred due to recent antihistamines use"}. *** Results discussed with patient/family.   Medication List:  Current Outpatient Medications  Medication Sig Dispense Refill  . acetaminophen (TYLENOL) 650 MG CR tablet Take 650 mg by mouth every 8 (eight) hours as needed for pain.    . carvedilol (COREG) 25 MG tablet Take 1 tablet (25 mg total) by mouth 2 (two) times daily. 60 tablet 3  . cetirizine (ZYRTEC) 10 MG tablet Take 10 mg by mouth as needed for allergies.    . Cranberry 475 MG CAPS Take 1 capsule (475 mg total) by mouth 2 (two) times daily. 60 capsule 0  . EPINEPHrine (EPIPEN 2-PAK) 0.3 mg/0.3 mL IJ SOAJ injection Inject 0.3 mg into the muscle as needed. Inject 0.3mg  intramuscular route once as needed. 1 each 1  . fluticasone (FLONASE) 50 MCG/ACT nasal spray Place 1 spray into both nostrils 2 (two) times daily as needed (nasal congestion). 16 g 5  . ipratropium (ATROVENT) 0.03 % nasal spray Place 2 sprays into both nostrils 2 (two) times daily. 30 mL 3  . mometasone (ELOCON) 0.1 % cream Apply 1 Application topically daily as needed. 45 g 3  . olmesartan (BENICAR) 40 MG tablet Take 1 tablet (40 mg total) by mouth daily. 90 tablet 1  . polyethylene glycol powder (GLYCOLAX/MIRALAX) 17 GM/SCOOP powder Take 17 g by mouth 2 (two) times daily as needed. 3350 g 1  . saccharomyces boulardii (FLORASTOR) 250 MG capsule Take 1 capsule (250 mg total) by mouth 2 (two) times daily. Take along with CIPRO 500mg  to prevent antibiotics associated diarrhea 20 capsule 0  . simvastatin (ZOCOR) 20 MG tablet TAKE 1 TABLET(20 MG) BY MOUTH DAILY 90 tablet 1   No current facility-administered medications for this visit.   Allergies: Allergies  Allergen Reactions  . Codeine Nausea Only    unknown  . Tramadol Nausea Only   I reviewed her past medical history, social history,  family history, and environmental history and no significant changes have been reported from her previous visit.  Review of Systems  Constitutional:  Negative for appetite change, chills, fever and unexpected weight change.  HENT:  Positive for congestion, postnasal drip and rhinorrhea.   Eyes:  Negative for itching.  Respiratory:  Positive for shortness of breath. Negative for cough, chest tightness and wheezing.   Cardiovascular:  Negative for chest pain.  Gastrointestinal:  Negative for abdominal pain.  Genitourinary:  Negative for difficulty urinating.  Skin:  Positive for rash.  Allergic/Immunologic: Positive for environmental allergies. Negative for food allergies.  Neurological:  Positive for headaches.   Objective: There were no vitals taken for this visit. There is no height or weight on file to calculate BMI. Physical Exam Vitals and nursing note reviewed.  Constitutional:      Appearance: Normal appearance. She is well-developed.  HENT:     Head: Normocephalic and atraumatic.     Right Ear: Tympanic membrane and external ear normal.     Left Ear: Tympanic membrane and external ear normal.     Nose: Nose normal.     Mouth/Throat:     Mouth: Mucous membranes are moist.     Pharynx: Oropharynx is clear.  Eyes:     Conjunctiva/sclera: Conjunctivae normal.  Cardiovascular:     Rate and Rhythm: Normal rate and regular rhythm.     Heart sounds: Normal heart sounds. No murmur heard.    No friction rub. No gallop.  Pulmonary:     Effort: Pulmonary  effort is normal.     Breath sounds: Normal breath sounds. No wheezing, rhonchi or rales.  Musculoskeletal:     Cervical back: Neck supple.  Skin:    General: Skin is warm.     Findings: Rash present.     Comments: Hyperpigmentation on the left anterior shin area and on arms b/l.   Neurological:     Mental Status: She is alert and oriented to person, place, and time.  Psychiatric:        Behavior: Behavior normal.  Previous  notes and tests were reviewed. The plan was reviewed with the patient/family, and all questions/concerned were addressed.  It was my pleasure to see Ashley Johns today and participate in her care. Please feel free to contact me with any questions or concerns.  Sincerely,  Wyline Mood, DO Allergy & Immunology  [facility] office: [phone] [facility] office: [phone]

## 2021-09-04 ENCOUNTER — Encounter: Payer: Self-pay | Admitting: Allergy

## 2021-09-04 ENCOUNTER — Ambulatory Visit (INDEPENDENT_AMBULATORY_CARE_PROVIDER_SITE_OTHER): Payer: Medicare Other | Admitting: Allergy

## 2021-09-04 VITALS — BP 130/82 | HR 69 | Temp 98.0°F | Resp 16

## 2021-09-04 DIAGNOSIS — R21 Rash and other nonspecific skin eruption: Secondary | ICD-10-CM

## 2021-09-04 DIAGNOSIS — R519 Headache, unspecified: Secondary | ICD-10-CM

## 2021-09-04 DIAGNOSIS — R0609 Other forms of dyspnea: Secondary | ICD-10-CM | POA: Diagnosis not present

## 2021-09-04 DIAGNOSIS — J3089 Other allergic rhinitis: Secondary | ICD-10-CM

## 2021-09-04 MED ORDER — RYALTRIS 665-25 MCG/ACT NA SUSP: 1.0000 | Freq: Two times a day (BID) | NASAL | 5 refills | Status: DC

## 2021-09-04 NOTE — Patient Instructions (Addendum)
Environmental allergies 2023 skin testing showed: Positive to dust mites and cockroaches.  Continue environmental control measures as below. Use over the counter antihistamines such as Zyrtec (cetirizine), Claritin (loratadine), Allegra (fexofenadine), or Xyzal (levocetirizine) daily as needed. May switch antihistamines every few months. Start Ryaltris (olopatadine + mometasone nasal spray combination) 1-2 sprays per nostril twice a day. Sample given. This replaces your other nasal sprays. If this works well for you, then have [facility] ship the medication to your home - prescription already sent in.  If it's not covered let me know.  Nasal saline spray (i.e., Simply Saline) or nasal saline lavage (i.e., NeilMed) is recommended as needed and prior to medicated nasal sprays.  Rash Continue roper skin care.  Make sure you are using things without fragrances and scents.   Breathing: May use albuterol rescue inhaler 2 puffs every 4 to 6 hours as needed for shortness of breath, chest tightness, coughing, and wheezing. Monitor frequency of use.   Follow up in 6 months or sooner if needed.  Control of House Dust Mite Allergen Dust mite allergens are a common trigger of allergy and asthma symptoms. While they can be found throughout the house, these microscopic creatures thrive in warm, humid environments such as bedding, upholstered furniture and carpeting. Because so much time is spent in the bedroom, it is essential to reduce mite levels there.  Encase pillows, mattresses, and box springs in special allergen-proof fabric covers or airtight, zippered plastic covers.  Bedding should be washed weekly in hot water (130 F) and dried in a hot dryer. Allergen-proof covers are available for comforters and pillows that can't be regularly washed.  Wash the allergy-proof covers every few months. Minimize clutter in the bedroom. Keep pets out of the bedroom.  Keep humidity less than 50% by using a  dehumidifier or air conditioning. You can buy a humidity measuring device called a hygrometer to monitor this.  If possible, replace carpets with hardwood, linoleum, or washable area rugs. If that's not possible, vacuum frequently with a vacuum that has a HEPA filter. Remove all upholstered furniture and non-washable window drapes from the bedroom. Remove all non-washable stuffed toys from the bedroom.  Wash stuffed toys weekly.  Cockroach Allergen Avoidance Cockroaches are often found in the homes of densely populated urban areas, schools or commercial buildings, but these creatures can lurk almost anywhere. This does not mean that you have a dirty house or living area. Block all areas where roaches can enter the home. This includes crevices, wall cracks and windows.  Cockroaches need water to survive, so fix and seal all leaky faucets and pipes. Have an exterminator go through the house when your family and pets are gone to eliminate any remaining roaches. Keep food in lidded containers and put pet food dishes away after your pets are done eating. Vacuum and sweep the floor after meals, and take out garbage and recyclables. Use lidded garbage containers in the kitchen. Wash dishes immediately after use and clean under stoves, refrigerators or toasters where crumbs can accumulate. Wipe off the stove and other kitchen surfaces and cupboards regularly.  Skin care recommendations  Bath time: Always use lukewarm water. AVOID very hot or cold water. Keep bathing time to 5-10 minutes. Do NOT use bubble bath. Use a mild soap and use just enough to wash the dirty areas. Do NOT scrub skin vigorously.  After bathing, pat dry your skin with a towel. Do NOT rub or scrub the skin.  Moisturizers and prescriptions:  ALWAYS apply moisturizers immediately after bathing (within 3 minutes). This helps to lock-in moisture. Use the moisturizer several times a day over the whole body. Good summer moisturizers  include: Aveeno, CeraVe, Cetaphil. Good winter moisturizers include: Aquaphor, Vaseline, Cerave, Cetaphil, Eucerin, Vanicream. When using moisturizers along with medications, the moisturizer should be applied about one hour after applying the medication to prevent diluting effect of the medication or moisturize around where you applied the medications. When not using medications, the moisturizer can be continued twice daily as maintenance.  Laundry and clothing: Avoid laundry products with added color or perfumes. Use unscented hypo-allergenic laundry products such as Tide free, Cheer free & gentle, and All free and clear.  If the skin still seems dry or sensitive, you can try double-rinsing the clothes. Avoid tight or scratchy clothing such as wool. Do not use fabric softeners or dyer sheets.

## 2021-09-04 NOTE — Assessment & Plan Note (Signed)
Past history - Rash x 2 years on arms and legs. Flares about once per month and uses mometasone prn with good benefit. Patient thought she saw dermatology in the past and was told to use eczema lotion.  Interim history - slightly better.  . Continue proper skin care.  o Make sure you are using things without fragrances and scents.

## 2021-09-04 NOTE — Assessment & Plan Note (Signed)
Past history - Uses albuterol prn with some benefit - mainly with exertion. Follows with cardiology. 2023 spirometry showed: severe restrictive disease with 6% improvement in FEV1 post bronchodilator treatment. Clinically feeling improved.  Interim history - has mild/moderate aortic stenosis per cards. Only using albuterol with going up the stairs at times with good benefit.  . May use albuterol rescue inhaler 2 puffs every 4 to 6 hours as needed for shortness of breath, chest tightness, coughing, and wheezing. Monitor frequency of use.

## 2021-09-04 NOTE — Assessment & Plan Note (Signed)
Past history - Perennial rhinitis x 4 years. Tried antihistamines and ipratropium with some benefit. No prior ENT evaluation. 2023 skin testing showed: Positive to dust mites and cockroaches.  Interim history - still having symptoms but only using Atrovent nasal spray.  Continue environmental control measures as below.  Use over the counter antihistamines such as Zyrtec (cetirizine), Claritin (loratadine), Allegra (fexofenadine), or Xyzal (levocetirizine) daily as needed. May switch antihistamines every few months.  Start Ryaltris (olopatadine + mometasone nasal spray combination) 1-2 sprays per nostril twice a day. Sample given.  This replaces your other nasal sprays.  If this works well for you, then have [facility] ship the medication to your home - prescription already sent in.   If it's not covered let me know.   Nasal saline spray (i.e., Simply Saline) or nasal saline lavage (i.e., NeilMed) is recommended as needed and prior to medicated nasal sprays.  If no improvement will recommend ENT evaluation next.

## 2021-09-15 ENCOUNTER — Other Ambulatory Visit: Payer: Self-pay | Admitting: Family

## 2021-09-15 ENCOUNTER — Other Ambulatory Visit: Payer: Self-pay | Admitting: Internal Medicine

## 2021-10-11 ENCOUNTER — Other Ambulatory Visit: Payer: Self-pay | Admitting: Family

## 2021-10-11 DIAGNOSIS — E785 Hyperlipidemia, unspecified: Secondary | ICD-10-CM

## 2021-10-27 ENCOUNTER — Other Ambulatory Visit: Payer: Self-pay | Admitting: Family

## 2021-10-27 DIAGNOSIS — I10 Essential (primary) hypertension: Secondary | ICD-10-CM

## 2021-12-02 ENCOUNTER — Ambulatory Visit
Admission: RE | Admit: 2021-12-02 | Discharge: 2021-12-02 | Disposition: A | Discharge status: Home / Self Care | Payer: Medicare Other | Source: Ambulatory Visit | Attending: Family | Admitting: Family

## 2021-12-02 DIAGNOSIS — Z780 Asymptomatic menopausal state: Secondary | ICD-10-CM

## 2021-12-12 ENCOUNTER — Other Ambulatory Visit: Payer: Self-pay | Admitting: Allergy

## 2022-01-19 ENCOUNTER — Ambulatory Visit (INDEPENDENT_AMBULATORY_CARE_PROVIDER_SITE_OTHER): Payer: Medicare Other

## 2022-01-19 ENCOUNTER — Ambulatory Visit: Payer: Medicare Other | Attending: Internal Medicine | Admitting: Internal Medicine

## 2022-01-19 ENCOUNTER — Encounter: Payer: Self-pay | Admitting: Internal Medicine

## 2022-01-19 VITALS — BP 148/92 | HR 87 | Ht 60.0 in | Wt 175.0 lb

## 2022-01-19 DIAGNOSIS — R002 Palpitations: Secondary | ICD-10-CM

## 2022-01-19 NOTE — Progress Notes (Addendum)
Cardiology Office Note:    Date:  01/19/2022   ID:  Ashley Johns, DOB 02-04-1936, MRN [id number]  PCP:  Caesar Bookman, NP   [facility] Providers Cardiologist:  Maisie Fus, MD     Referring MD: Caesar Bookman, NP   No chief complaint on file. DOE  History of Present Illness:    Ashley Johns is a 86 y.o. female with a hx of graves dx s/p thyroidecotmy, htn referral from [facility] referral for SOB for 2-3 months  She notes SOB with walking upstairs. She then rests. Just walking to the garbage can she gets SOB. She did note some chest pressure with walking. Last week it was worse. She's had a lot of coughing. Daughter notes she has seasonal allergies. No LE edema. She's lost weight. She denies orthopnea, PND.  Has not seen a cardiologist. No hx of stress test.  No cath. TSH is normal.  Former smoker. She started in her 20s-30s.   She comes in with her daughter. They are from here. She lived in [place] briefly many years ago  EKG 04/23/2021-NSR,PACs  Interim Hx: For DOE, spect myoview was completed which was low risk. Her TTE showed EF 50-55, RV was normal, mild-moderate MR (no intrinsic valve dx), Aortic mean gradient 10 mmHg with a calcified valve. Blood pressure elevated today 160/90 ; typically systolics < 140 mmHg and normal DBP. She was stressed this AM. She still has SOB. She denies PND, orthopnea, no LE edema. If she travels at a fast pace with her mask that's when she notes it. If she walks normally it is fine. She can get from her car to church without issues. [facility].   Interim 01/19/2022 She notes some palpitations yesterday and some on Saturday. She was stumbling.  Was waking when it was warmer outside. It's more challenging when it is cold. She feels better when she walks. No syncope.    Cardiology Studies:  05/21/2021- SPECT,  low risk study. No scar or inducible ischemia  05/21/2021- EF ~55%, noted  hypokinesis of the inferolateral wall, don't appreciate significant hypokinesis. SPECT was nl. Aortic valve is calcified  meand gradient of 10 mmHg , mild to moderate AS. MR appears more mild    Past Medical History:  Diagnosis Date   Eczema    Per [facility] new patient packet   Graves disease    Per [facility] new patient packet   High blood pressure    Per [facility] new patient packet   Hypertension     Past Surgical History:  Procedure Laterality Date   TOTAL ABDOMINAL HYSTERECTOMY Bilateral 1970's   TUBAL LIGATION      Current Medications: Current Meds  Medication Sig   acetaminophen (TYLENOL) 650 MG CR tablet Take 650 mg by mouth every 8 (eight) hours as needed for pain.   carvedilol (COREG) 25 MG tablet TAKE 1 TABLET(25 MG) BY MOUTH TWICE DAILY   cetirizine (ZYRTEC) 10 MG tablet Take 10 mg by mouth as needed for allergies.   EPINEPHrine (EPIPEN 2-PAK) 0.3 mg/0.3 mL IJ SOAJ injection Inject 0.3 mg into the muscle as needed. Inject 0.3mg  intramuscular route once as needed.   fluticasone (FLONASE) 50 MCG/ACT nasal spray SHAKE LIQUID AND USE 1 SPRAY IN EACH NOSTRIL TWICE DAILY AS NEEDED FOR NASAL CONGESTION   mometasone (ELOCON) 0.1 % cream Apply 1 Application topically daily as needed.   olmesartan (BENICAR) 40 MG  tablet Take 1 tablet (40 mg total) by mouth daily.   Olopatadine-Mometasone (RYALTRIS) [id number] MCG/ACT SUSP Place 1-2 sprays into the nose in the morning and at bedtime.   saccharomyces boulardii (FLORASTOR) 250 MG capsule Take 1 capsule (250 mg total) by mouth 2 (two) times daily. Take along with CIPRO 500mg  to prevent antibiotics associated diarrhea   simvastatin (ZOCOR) 20 MG tablet TAKE 1 TABLET(20 MG) BY MOUTH DAILY     Allergies:   Codeine and Tramadol   Social History   Socioeconomic History   Marital status: Widowed    Spouse name: Not on file   Number of children: Not on file   Years of education: Not on file   Highest education level: Not on file  Occupational History    Not on file  Tobacco Use   Smoking status: Former   Smokeless tobacco: Never   Tobacco comments:    Smoked for 6 years, Quit at age 69  Vaping Use   Vaping Use: Never used  Substance and Sexual Activity   Alcohol use: Never   Drug use: Never   Sexual activity: Not Currently  Other Topics Concern   Not on file  Social History Narrative   Diet      Do you drink/eat things with caffeine: Yes      Marital Status: Widowed  What year were you married? 1972      Do you live in a house, apartment, assisted living, condo, trailer, etc.? Townhouse      Is it one or more stories? 2      How many persons live in your home? 3         Do you have any pets in your home?(please list): Yes, dog and fish      Highest level of education completed: High School      Current or past profession: Day care owner, foster parent      Do you exercise?: sometimes Type and how often: Walking      Living Will? No      DNR form? No  If not, do you wish to discuss one?: Yes      POA/HPOA forms? No      Difficulty bathing or dressing yourself? No      Difficulty preparing food or eating? No      Difficulty managing medications? No      Difficulty managing your finances? No      Difficulty affording your medications? No                     Social Determinants of Health   Financial Resource Strain: Low Risk  (12/09/2017)   Overall Financial Resource Strain (CARDIA)    Difficulty of Paying Living Expenses: Not hard at all  Food Insecurity: No Food Insecurity (12/09/2017)   Hunger Vital Sign    Worried About Running Out of Food in the Last Year: Never true    Ran Out of Food in the Last Year: Never true  Transportation Needs: No Transportation Needs (12/09/2017)   PRAPARE - 12/11/2017 (Medical): No    Lack of Transportation (Non-Medical): No  Physical Activity: Unknown (09/08/2018)   Exercise Vital Sign    Days of Exercise per Week: 1 day    Minutes  of Exercise per Session: Not on file  Stress: No Stress Concern Present (12/09/2017)   12/11/2017 of Occupational Health - Occupational Stress Questionnaire  Feeling of Stress : Not at all  Social Connections: Not on file     Family History: The patient's family history includes Hypertension in her father; Tuberculosis in her mother.  ROS:   Please see the history of present illness.     All other systems reviewed and are negative.  EKGs/Labs/Other Studies Reviewed:    The following studies were reviewed today:   EKG:  EKG is  ordered today.  The ekg ordered today demonstrates   05/01/2021- NSR, PACs  01/19/2022- NSR with 1st degree AV block  Recent Labs: 08/26/2021: ALT 11; BUN 21; Creat 1.05; Hemoglobin 13.8; Platelets 229; Potassium 5.1; Sodium 139; TSH 1.02   Recent Lipid Panel    Component Value Date/Time   CHOL 173 08/26/2021 1039   TRIG 67 08/26/2021 1039   HDL 66 08/26/2021 1039   CHOLHDL 2.6 08/26/2021 1039   LDLCALC 92 08/26/2021 1039     Risk Assessment/Calculations:           Physical Exam:    VS:  Vitals:   01/19/22 0937  BP: (!) 148/92  Pulse: 87  SpO2: 95%      Wt Readings from Last 3 Encounters:  01/19/22 175 lb (79.4 kg)  08/26/21 173 lb 6.4 oz (78.7 kg)  08/15/21 173 lb (78.5 kg)     GEN:  Well nourished, well developed in no acute distress HEENT: Normal NECK: No JVD; No carotid bruits LYMPHATICS: No lymphadenopathy CARDIAC: RRR, SEM RUSB, rubs, gallops RESPIRATORY:  Clear to auscultation without rales, wheezing or rhonchi  ABDOMEN: Soft, non-tender, non-distended MUSCULOSKELETAL:  No edema; No deformity  SKIN: Warm and dry NEUROLOGIC:  Alert and oriented x 3 PSYCHIATRIC:  Normal affect   ASSESSMENT:   #Palpitations: will get ziopatch  #Aortic Stenosis/Mild-Moderate: She had normal SPECT. Echo showed mild-moderate MR and mild-mod AS. E/e' 11 mildly elevated. Suspect this is related to her aortic stenosis. She does  not report any syncopal events. Her mean gradient was 10 mmHg but the valve is calcified; may not have captured the true peak velocity. We discussed DOE may be related to deconditioning as well; which does improve with exercise. No changes today.  #Mild MR: follow up echo surveillance in 2-3 years  with symptoms would do sooner  #HTN: typically controlled at home. Stopped atenolol and started carvediolol 25 mg BID. Continue olmesartan 40 mg daily.  Recommended ambulatory BP monitoring  PLAN:    In order of problems listed above:  14 day ziopatch Follow up in 12 months    Medication Adjustments/Labs and Tests Ordered: Current medicines are reviewed at length with the patient today.  Concerns regarding medicines are outlined above.  No orders of the defined types were placed in this encounter.  No orders of the defined types were placed in this encounter.   Patient Instructions  Medication Instructions:  No Changes In Medications at this time.  *If you need a refill on your cardiac medications before your next appointment, please call your pharmacy*  Lab Work: None Ordered At This Time.  If you have labs (blood work) drawn today and your tests are completely normal, you will receive your results only by: MyChart Message (if you have MyChart) OR A paper copy in the mail If you have any lab test that is abnormal or we need to change your treatment, we will call you to review the results.  Testing/Procedures:  Christena Deem- Long Term Monitor Instructions   Your physician has requested you wear your  ZIO patch monitor___14____days.   This is a single patch monitor.  [employer] supplies one patch monitor per enrollment.  Additional stickers are not available.   Please do not apply patch if you will be having a Nuclear Stress Test, Echocardiogram, Cardiac CT, MRI, or Chest Xray during the time frame you would be wearing the monitor. The patch cannot be worn during these tests.  You cannot  remove and re-apply the ZIO XT patch monitor.   Your ZIO patch monitor will be sent USPS Priority mail from [facility] directly to your home address. The monitor may also be mailed to a PO BOX if home delivery is not available.   It may take 3-5 days to receive your monitor after you have been enrolled.   Once you have received you monitor, please review enclosed instructions.  Your monitor has already been registered assigning a specific monitor serial # to you.   Applying the monitor   Shave hair from upper left chest.   Hold abrader disc by orange tab.  Rub abrader in 40 strokes over left upper chest as indicated in your monitor instructions.   Clean area with 4 enclosed alcohol pads .  Use all pads to assure are is cleaned thoroughly.  Let dry.   Apply patch as indicated in monitor instructions.  Patch will be place under collarbone on left side of chest with arrow pointing upward.   Rub patch adhesive wings for 2 minutes.Remove white label marked "1".  Remove white label marked "2".  Rub patch adhesive wings for 2 additional minutes.   While looking in a mirror, press and release button in center of patch.  A small green light will flash 3-4 times .  This will be your only indicator the monitor has been turned on.     Do not shower for the first 24 hours.  You may shower after the first 24 hours.   Press button if you feel a symptom. You will hear a small click.  Record Date, Time and Symptom in the Patient Log Book.   When you are ready to remove patch, follow instructions on last 2 pages of Patient Log Book.  Stick patch monitor onto last page of Patient Log Book.   Place Patient Log Book in [place] box.  Use locking tab on box and tape box closed securely.  The [employer] and [employer] has [employer] on it.  Please place in mailbox as soon as possible.  Your physician should have your test results approximately 7 days after the monitor has been mailed back to [facility]    Call [facility] Customer Care at [phone] if you have questions regarding your ZIO XT patch monitor.  Call them immediately if you see an orange light blinking on your monitor.   If your monitor falls off in less than 4 days contact our Monitor department at [phone].  If your monitor becomes loose or falls off after 4 days call [employer] at [phone] for suggestions on securing your monitor.   Follow-Up: At [facility], you and your health needs are our priority.  As part of our continuing mission to provide you with exceptional heart care, we have created designated Provider Care Teams.  These Care Teams include your primary Cardiologist (physician) and Advanced Practice Providers (APPs -  Physician Assistants and Nurse Practitioners) who all work together to provide you with the care you need, when you need it.  We recommend signing up for the patient  portal called "MyChart".  Sign up information is provided on this After Visit Summary.  MyChart is used to connect with patients for Virtual Visits (Telemedicine).  Patients are able to view lab/test results, encounter notes, upcoming appointments, etc.  Non-urgent messages can be sent to your provider as well.   To learn more about what you can do with MyChart, go to [email or web address].    Your next appointment:   1 year(s)  The format for your next appointment:   In Person  Provider:   Maisie FusBranch, Neenah Canter E, MD           Signed, Maisie FusBranch, Shauntavia Brackin E, MD  01/19/2022 9:59 AM    [facility]

## 2022-01-19 NOTE — Progress Notes (Unsigned)
Enrolled for [employer] to mail a ZIO XT long term holter monitor to the patients address on file.  

## 2022-01-19 NOTE — Patient Instructions (Signed)
Medication Instructions:  No Changes In Medications at this time.  *If you need a refill on your cardiac medications before your next appointment, please call your pharmacy*  Lab Work: None Ordered At This Time.  If you have labs (blood work) drawn today and your tests are completely normal, you will receive your results only by: [place] (if you have MyChart) OR A paper copy in the mail If you have any lab test that is abnormal or we need to change your treatment, we will call you to review the results.  Testing/Procedures:  [place]- Long Term Monitor Instructions   Your physician has requested you wear your ZIO patch monitor___14____days.   This is a single patch monitor.  [employer] supplies one patch monitor per enrollment.  Additional stickers are not available.   Please do not apply patch if you will be having a Nuclear Stress Test, Echocardiogram, Cardiac CT, MRI, or Chest Xray during the time frame you would be wearing the monitor. The patch cannot be worn during these tests.  You cannot remove and re-apply the ZIO XT patch monitor.   Your ZIO patch monitor will be sent USPS Priority mail from [facility] directly to your home address. The monitor may also be mailed to a PO BOX if home delivery is not available.   It may take 3-5 days to receive your monitor after you have been enrolled.   Once you have received you monitor, please review enclosed instructions.  Your monitor has already been registered assigning a specific monitor serial # to you.   Applying the monitor   Shave hair from upper left chest.   Hold abrader disc by orange tab.  Rub abrader in 40 strokes over left upper chest as indicated in your monitor instructions.   Clean area with 4 enclosed alcohol pads .  Use all pads to assure are is cleaned thoroughly.  Let dry.   Apply patch as indicated in monitor instructions.  Patch will be place under collarbone on left side of chest with arrow pointing  upward.   Rub patch adhesive wings for 2 minutes.Remove white label marked "1".  Remove white label marked "2".  Rub patch adhesive wings for 2 additional minutes.   While looking in a mirror, press and release button in center of patch.  A small green light will flash 3-4 times .  This will be your only indicator the monitor has been turned on.     Do not shower for the first 24 hours.  You may shower after the first 24 hours.   Press button if you feel a symptom. You will hear a small click.  Record Date, Time and Symptom in the Patient Log Book.   When you are ready to remove patch, follow instructions on last 2 pages of Patient Log Book.  Stick patch monitor onto last page of Patient Log Book.   Place Patient Log Book in [place] box.  Use locking tab on box and tape box closed securely.  The [employer] and [employer] has [employer] on it.  Please place in mailbox as soon as possible.  Your physician should have your test results approximately 7 days after the monitor has been mailed back to [facility].   Call [place] at [phone] if you have questions regarding your ZIO XT patch monitor.  Call them immediately if you see an orange light blinking on your monitor.   If your monitor falls off in less  than 4 days contact our Monitor department at [phone].  If your monitor becomes loose or falls off after 4 days call [employer] at [phone] for suggestions on securing your monitor.   Follow-Up: At [facility], you and your health needs are our priority.  As part of our continuing mission to provide you with exceptional heart care, we have created designated Provider Care Teams.  These Care Teams include your primary Cardiologist (physician) and Advanced Practice Providers (APPs -  Physician Assistants and Nurse Practitioners) who all work together to provide you with the care you need, when you need it.  We recommend signing up for the patient portal  called "MyChart".  Sign up information is provided on this After Visit Summary.  MyChart is used to connect with patients for Virtual Visits (Telemedicine).  Patients are able to view lab/test results, encounter notes, upcoming appointments, etc.  Non-urgent messages can be sent to your provider as well.   To learn more about what you can do with MyChart, go to [email or web address].    Your next appointment:   1 year(s)  The format for your next appointment:   In Person  Provider:   Maisie Fus, MD

## 2022-01-21 DIAGNOSIS — R002 Palpitations: Secondary | ICD-10-CM

## 2022-02-06 DIAGNOSIS — R002 Palpitations: Secondary | ICD-10-CM | POA: Diagnosis not present

## 2022-02-18 ENCOUNTER — Encounter: Payer: Self-pay | Admitting: *Deleted

## 2022-02-24 ENCOUNTER — Ambulatory Visit (INDEPENDENT_AMBULATORY_CARE_PROVIDER_SITE_OTHER): Payer: Medicare Other | Admitting: Family

## 2022-02-24 ENCOUNTER — Encounter: Payer: Self-pay | Admitting: Family

## 2022-02-24 VITALS — BP 140/90 | HR 74 | Temp 97.2°F | Resp 16 | Ht 60.0 in | Wt 177.6 lb

## 2022-02-24 DIAGNOSIS — R42 Dizziness and giddiness: Secondary | ICD-10-CM | POA: Diagnosis not present

## 2022-02-24 DIAGNOSIS — I10 Essential (primary) hypertension: Secondary | ICD-10-CM

## 2022-02-24 LAB — CBC WITH DIFFERENTIAL/PLATELET
Absolute Monocytes: 533 cells/uL (ref 200–950)
Basophils Absolute: 31 cells/uL (ref 0–200)
Basophils Relative: 0.5 %
Eosinophils Absolute: 434 cells/uL (ref 15–500)
Eosinophils Relative: 7 %
HCT: 39.6 % (ref 35.0–45.0)
Hemoglobin: 13.4 g/dL (ref 11.7–15.5)
Lymphs Abs: 1742 cells/uL (ref 850–3900)
MCH: 30.9 pg (ref 27.0–33.0)
MCHC: 33.8 g/dL (ref 32.0–36.0)
MCV: 91.2 fL (ref 80.0–100.0)
MPV: 10 fL (ref 7.5–12.5)
Monocytes Relative: 8.6 %
Neutro Abs: 3460 cells/uL (ref 1500–7800)
Neutrophils Relative %: 55.8 %
Platelets: 254 10*3/uL (ref 140–400)
RBC: 4.34 10*6/uL (ref 3.80–5.10)
RDW: 12.4 % (ref 11.0–15.0)
Total Lymphocyte: 28.1 %
WBC: 6.2 10*3/uL (ref 3.8–10.8)

## 2022-02-24 NOTE — Progress Notes (Signed)
Provider: Marlowe Sax FNP-C  Jillene Wehrenberg, Nelda Bucks, NP  Patient Care Team: Dorise Gangi, Nelda Bucks, NP as PCP - General (Family Medicine) [name], MD as PCP - Cardiology (Cardiology) Harriett Sine, MD as Consulting Physician (Dermatology)  Extended Emergency Contact Information Primary Emergency Contact: [name] [place] Phone: [phone] Relation: Daughter Secondary Emergency Contact: booker,melonie [place] Phone: [phone] Relation: Granddaughter  Code Status:  Full Code  Goals of care: Advanced Directive information    02/24/2022    3:05 PM  Advanced Directives  Does Patient Have a Medical Advance Directive? Yes  Type of Advance Directive [place]  Does patient want to make changes to medical advance directive? No - Patient declined  Copy of [place] in Chart? No - copy requested     Chief Complaint  Patient presents with   Acute Visit    Patient complains of feeling lightheaded and dizzy.     HPI:  Pt is a 87 y.o. female seen today for an acute visit for evaluation of lightheaded and dizziness.states was cooking when she turned too quick and room started spinning.Had some chest tightness and hands felt cold.Grandson was around was able to hold her tight and assisted her to sit on a chair. Has no nausea or vomiting.   She denies any fever,chills,cough,fatigue,body aches,runny nose,chest tightness,chest pain,palpitation or shortness of breath.  Daughter here with her states patient forgets to take her blood pressure  medication.     Past Medical History:  Diagnosis Date   Eczema    Per [place] new patient packet   Graves disease    Per [place] new patient packet   High blood pressure    Per [facility] new patient packet   Hypertension    Past Surgical History:  Procedure Laterality Date   TOTAL ABDOMINAL HYSTERECTOMY Bilateral 1970's   TUBAL LIGATION      Allergies  Allergen Reactions    Codeine Nausea Only    unknown   Tramadol Nausea Only    Outpatient Encounter Medications as of 02/24/2022  Medication Sig   acetaminophen (TYLENOL) 650 MG CR tablet Take 650 mg by mouth every 8 (eight) hours as needed for pain.   carvedilol (COREG) 25 MG tablet TAKE 1 TABLET(25 MG) BY MOUTH TWICE DAILY   cetirizine (ZYRTEC) 10 MG tablet Take 10 mg by mouth as needed for allergies.   EPINEPHrine (EPIPEN 2-PAK) 0.3 mg/0.3 mL IJ SOAJ injection Inject 0.3 mg into the muscle as needed. Inject 0.3mg  intramuscular route once as needed.   fluticasone (FLONASE) 50 MCG/ACT nasal spray SHAKE LIQUID AND USE 1 SPRAY IN EACH NOSTRIL TWICE DAILY AS NEEDED FOR NASAL CONGESTION   mometasone (ELOCON) 0.1 % cream Apply 1 Application topically daily as needed.   olmesartan (BENICAR) 40 MG tablet Take 1 tablet (40 mg total) by mouth daily.   Olopatadine-Mometasone (RYALTRIS) [id number] MCG/ACT SUSP Place 1-2 sprays into the nose in the morning and at bedtime.   simvastatin (ZOCOR) 20 MG tablet TAKE 1 TABLET(20 MG) BY MOUTH DAILY   [DISCONTINUED] polyethylene glycol powder (GLYCOLAX/MIRALAX) 17 GM/SCOOP powder Take 17 g by mouth 2 (two) times daily as needed.   [DISCONTINUED] saccharomyces boulardii (FLORASTOR) 250 MG capsule Take 1 capsule (250 mg total) by mouth 2 (two) times daily. Take along with CIPRO 500mg  to prevent antibiotics associated diarrhea   No facility-administered encounter medications on file as of 02/24/2022.    Review of Systems  Constitutional:  Negative for  appetite change, chills, fatigue, fever and unexpected weight change.  HENT:  Positive for hearing loss. Negative for congestion, dental problem, ear discharge, ear pain, facial swelling, nosebleeds, postnasal drip, rhinorrhea, sinus pressure, sinus pain, sneezing, sore throat, tinnitus and trouble swallowing.        Hearing aid on left ear   Eyes:  Negative for pain, discharge, redness, itching and visual disturbance.  Respiratory:   Negative for cough, chest tightness, shortness of breath and wheezing.   Cardiovascular:  Negative for chest pain, palpitations and leg swelling.  Gastrointestinal:  Negative for abdominal distention, abdominal pain, blood in stool, constipation, diarrhea, nausea and vomiting.  Endocrine: Negative for cold intolerance, heat intolerance, polydipsia, polyphagia and polyuria.  Genitourinary:  Negative for difficulty urinating, dysuria, flank pain, frequency and urgency.  Musculoskeletal:  Negative for arthralgias, back pain, gait problem, joint swelling, myalgias, neck pain and neck stiffness.  Skin:  Negative for color change, pallor, rash and wound.  Neurological:  Negative for dizziness, syncope, speech difficulty, weakness, light-headedness, numbness and headaches.  Hematological:  Does not bruise/bleed easily.  Psychiatric/Behavioral:  Negative for agitation, behavioral problems, confusion, hallucinations, self-injury, sleep disturbance and suicidal ideas. The patient is not nervous/anxious.     Immunization History  Administered Date(s) Administered   Fluad Quad(high Dose 65+) 10/11/2018   Influenza, High Dose Seasonal PF 12/03/2015, 11/07/2019   Influenza,inj,Quad PF,6+ Mos 10/11/2018   Influenza-Unspecified 11/16/2017, 11/27/2020   PFIZER Comirnaty(Gray Top)Covid-19 Tri-Sucrose Vaccine 07/31/2020   PFIZER(Purple Top)SARS-COV-2 Vaccination 02/18/2019, 03/11/2019, 11/29/2019, 07/31/2020   Pneumococcal Conjugate-13 10/11/2018   Td 10/11/2018   Tdap 10/11/2018   Pertinent  Health Maintenance Due  Topic Date Due   INFLUENZA VACCINE  09/09/2021   DEXA SCAN  Completed      06/12/2021   10:43 AM 08/15/2021    9:49 AM 08/22/2021    9:06 AM 08/26/2021    9:28 AM 02/24/2022    3:01 PM  Fall Risk  Falls in the past year? 0 0 0 0 0  Was there an injury with Fall? 0 0 0 0 0  Fall Risk Category Calculator 0 0 0 0 0  Fall Risk Category (Retired) Low Low Low Low   (RETIRED) Patient Fall Risk  Level Low fall risk Low fall risk Low fall risk Low fall risk   Patient at Risk for Falls Due to No Fall Risks No Fall Risks No Fall Risks No Fall Risks No Fall Risks  Fall risk Follow up Falls evaluation completed Falls evaluation completed Falls evaluation completed Falls evaluation completed Falls evaluation completed   Functional Status Survey:    Vitals:   02/24/22 1459  BP: (!) 140/90  Pulse: 74  Resp: 16  Temp: (!) 97.2 F (36.2 C)  SpO2: 90%  Weight: 177 lb 9.6 oz (80.6 kg)  Height: 5' (1.524 m)   Body mass index is 34.69 kg/m. Physical Exam Vitals reviewed.  Constitutional:      General: She is not in acute distress.    Appearance: Normal appearance. She is normal weight. She is not ill-appearing or diaphoretic.  HENT:     Head: Normocephalic.     Right Ear: Tympanic membrane, ear canal and external ear normal. There is no impacted cerumen.     Left Ear: Tympanic membrane, ear canal and external ear normal. There is no impacted cerumen.     Ears:     Comments: Hearing aid in place     Nose: Nose normal. No congestion or rhinorrhea.  Mouth/Throat:     Mouth: Mucous membranes are moist.     Pharynx: Oropharynx is clear. No oropharyngeal exudate or posterior oropharyngeal erythema.  Eyes:     General: No scleral icterus.       Right eye: No discharge.        Left eye: No discharge.     Extraocular Movements: Extraocular movements intact.     Conjunctiva/sclera: Conjunctivae normal.     Pupils: Pupils are equal, round, and reactive to light.  Neck:     Vascular: No carotid bruit.  Cardiovascular:     Rate and Rhythm: Normal rate and regular rhythm.     Pulses: Normal pulses.     Heart sounds: Normal heart sounds. No murmur heard.    No friction rub. No gallop.  Pulmonary:     Effort: Pulmonary effort is normal. No respiratory distress.     Breath sounds: Normal breath sounds. No wheezing, rhonchi or rales.  Chest:     Chest wall: No tenderness.   Abdominal:     General: Bowel sounds are normal. There is no distension.     Palpations: Abdomen is soft. There is no mass.     Tenderness: There is no abdominal tenderness. There is no right CVA tenderness, left CVA tenderness, guarding or rebound.  Musculoskeletal:        General: No swelling or tenderness. Normal range of motion.     Cervical back: Normal range of motion. No rigidity or tenderness.     Right lower leg: No edema.     Left lower leg: No edema.  Lymphadenopathy:     Cervical: No cervical adenopathy.  Skin:    General: Skin is warm and dry.     Coloration: Skin is not pale.     Findings: No bruising, erythema, lesion or rash.  Neurological:     Mental Status: She is alert and oriented to person, place, and time.     Cranial Nerves: No cranial nerve deficit.     Sensory: No sensory deficit.     Motor: No weakness.     Coordination: Coordination normal.     Gait: Gait abnormal.  Psychiatric:        Mood and Affect: Mood normal.        Speech: Speech normal.        Behavior: Behavior normal.        Thought Content: Thought content normal.        Judgment: Judgment normal.    Labs reviewed: Recent Labs    08/26/21 1039  NA 139  K 5.1  CL 103  CO2 30  GLUCOSE 99  BUN 21  CREATININE 1.05*  CALCIUM 9.6   Recent Labs    08/26/21 1039  AST 13  ALT 11  BILITOT 0.5  PROT 7.9   Recent Labs    08/26/21 1039  WBC 5.2  NEUTROABS 3,198  HGB 13.8  HCT 41.7  MCV 91.0  PLT 229   Lab Results  Component Value Date   TSH 1.02 08/26/2021   Lab Results  Component Value Date   HGBA1C 5.8 (H) 08/26/2021   Lab Results  Component Value Date   CHOL 173 08/26/2021   HDL 66 08/26/2021   LDLCALC 92 08/26/2021   TRIG 67 08/26/2021   CHOLHDL 2.6 08/26/2021    Significant Diagnostic Results in last 30 days:  LONG TERM MONITOR (3-14 DAYS)  Result Date: 02/11/2022 Frequent brief SVT. 15 triggered events for sinus  rhythm, PVCs and SVE Patch Wear Time:  11  days and 13 hours (2023-12-13T20:28:28-0500 to 2023-12-25T10:00:36-0500) Patient had a min HR of 52 bpm, max HR of 200 bpm, and avg HR of 83 bpm. Predominant underlying rhythm was Sinus Rhythm. 290 Supraventricular Tachycardia runs occurred, the run with the fastest interval lasting 6 beats with a max rate of 200 bpm, the longest lasting 11.6 secs with an avg rate of 170 bpm. Isolated SVEs were occasional (3.0%, 38196), SVE Couplets were rare (<1.0%, 4555), and SVE Triplets were rare (<1.0%, 2225). Isolated VEs were rare (<1.0%), VE Couplets were rare (<1.0%), and no VE Triplets were present. Ventricular Bigeminy and Trigeminy were present. Inverted QRS complexes possibly due to inverted placement of device.    Assessment/Plan 1. Dizziness Had an episode when she was cooking turned abrupt and felt like the room was spinning.symptoms was associated with chest tightness and cold hands.No fall. - EKG 12-Lead indicated sinus rhythm with first-degree AV block heart rate 81 no changes compared to previous EKG. - CBC with Differential/Platelet  2. Essential hypertension Blood pressure slightly high 140/94 did not take her blood pressure medication.  Per daughter patient has been forgetting to take blood pressure medication.  Suspect possible causing her dizziness.  Discussed with patient and daughter to remind her to take her blood pressure medication. -Continue on olmesartan and Coreg -Continue to follow-up with a cardiologist as scheduled  Family/ staff Communication: Reviewed plan of care with patient  Labs/tests ordered:  - EKG 12-Lead - CBC with Differential/Platelet  Next Appointment: Return if symptoms worsen or fail to improve.   Caesar Bookman, NP

## 2022-02-26 ENCOUNTER — Encounter: Payer: Medicare Other | Admitting: Family

## 2022-03-03 ENCOUNTER — Encounter: Payer: Self-pay | Admitting: Internal Medicine

## 2022-03-03 ENCOUNTER — Ambulatory Visit: Payer: Medicare Other | Attending: Internal Medicine | Admitting: Internal Medicine

## 2022-03-03 VITALS — BP 126/80 | HR 92 | Ht 61.0 in | Wt 174.8 lb

## 2022-03-03 DIAGNOSIS — R42 Dizziness and giddiness: Secondary | ICD-10-CM

## 2022-03-03 MED ORDER — CARVEDILOL 3.125 MG PO TABS: 3.1250 mg | ORAL_TABLET | Freq: Two times a day (BID) | ORAL | 3 refills | Status: DC

## 2022-03-03 NOTE — Patient Instructions (Signed)
Medication Instructions:  Your physician has recommended you make the following change in your medication:  DECREASE: Carvedilol 3.125 twice daily  *If you need a refill on your cardiac medications before your next appointment, please call your pharmacy*   Lab Work: NONE If you have labs (blood work) drawn today and your tests are completely normal, you will receive your results only by: [place] (if you have MyChart) OR A paper copy in the mail If you have any lab test that is abnormal or we need to change your treatment, we will call you to review the results.   Testing/Procedures: NONE   Follow-Up: At [facility], you and your health needs are our priority.  As part of our continuing mission to provide you with exceptional heart care, we have created designated Provider Care Teams.  These Care Teams include your primary Cardiologist (physician) and Advanced Practice Providers (APPs -  Physician Assistants and Nurse Practitioners) who all work together to provide you with the care you need, when you need it.  We recommend signing up for the patient portal called "MyChart".  Sign up information is provided on this After Visit Summary.  MyChart is used to connect with patients for Virtual Visits (Telemedicine).  Patients are able to view lab/test results, encounter notes, upcoming appointments, etc.  Non-urgent messages can be sent to your provider as well.   To learn more about what you can do with MyChart, go to [email or web address].    Your next appointment:   3 month(s)  Provider:   [name], MD

## 2022-03-03 NOTE — Progress Notes (Signed)
Cardiology Office Note:    Date:  03/03/2022   ID:  ANJELI SCHLESSER, DOB [birth year], MRN [id number]  PCP:  Sandrea Hughs, NP   [facility] Providers Cardiologist:  [name], MD     Referring MD: Sandrea Hughs, NP   No chief complaint on file. DOE  History of Present Illness:    Ashley Johns is a 87 y.o. female with a hx of graves dx s/p thyroidecotmy, htn referral from [place] referral for SOB for 2-3 months  She notes SOB with walking upstairs. She then rests. Just walking to the garbage can she gets SOB. She did note some chest pressure with walking. Last week it was worse. She's had a lot of coughing. Daughter notes she has seasonal allergies. No LE edema. She's lost weight. She denies orthopnea, PND.  Has not seen a cardiologist. No hx of stress test.  No cath. TSH is normal.  Former smoker. She started in her 20s-30s.   She comes in with her daughter. They are from here. She lived in [place] briefly many years ago  EKG 04/23/2021-NSR,PACs  Interim Hx: For DOE, spect myoview was completed which was low risk. Her TTE showed EF 50-55, RV was normal, mild-moderate MR (no intrinsic valve dx), Aortic mean gradient 10 mmHg with a calcified valve. Blood pressure elevated today 160/90 ; typically systolics < [id number] mmHg and normal DBP. She was stressed this AM. She still has SOB. She denies PND, orthopnea, no LE edema. If she travels at a fast pace with her mask that's when she notes it. If she walks normally it is fine. She can get from her car to church without issues. [facility].   Interim 01/19/2022 She notes some palpitations yesterday and some on Saturday. She was stumbling.  Was walking when it was warmer outside. It's more challenging when it is cold. She feels better when she walks. No syncope.   Interim 03/03/2022 She was seen by her primary provider for dizziness and returns after 1 month. She noted it with turning  too quickly. Her BP is normal.  Her daughter made this appointment because she had dizziness with changing position. She was also having some SOB. She feels better now.    Cardiology Studies:  05/21/2021- SPECT,  low risk study. No scar or inducible ischemia  05/21/2021- EF ~55%, noted hypokinesis of the inferolateral wall, don't appreciate significant hypokinesis. SPECT was nl. Aortic valve is calcified  meand gradient of 10 mmHg , mild to moderate AS. MR appears more mild  [place] 01/19/2022- Brief SVT, minimal PVCs  Past Medical History:  Diagnosis Date   Eczema    Per [place] new patient packet   Graves disease    Per [place] new patient packet   High blood pressure    Per [facility] new patient packet   Hypertension     Past Surgical History:  Procedure Laterality Date   TOTAL ABDOMINAL HYSTERECTOMY Bilateral 1970's   TUBAL LIGATION      Current Medications: Current Meds  Medication Sig   acetaminophen (TYLENOL) 650 MG CR tablet Take 650 mg by mouth every 8 (eight) hours as needed for pain.   cetirizine (ZYRTEC) 10 MG tablet Take 10 mg by mouth as needed for allergies.   EPINEPHrine (EPIPEN 2-PAK) 0.3 mg/0.3 mL IJ SOAJ injection Inject 0.3 mg into the muscle as needed. Inject 0.3mg  intramuscular route once as needed.  fluticasone (FLONASE) 50 MCG/ACT nasal spray SHAKE LIQUID AND USE 1 SPRAY IN EACH NOSTRIL TWICE DAILY AS NEEDED FOR NASAL CONGESTION   mometasone (ELOCON) 0.1 % cream Apply 1 Application topically daily as needed.   olmesartan (BENICAR) 40 MG tablet Take 1 tablet (40 mg total) by mouth daily.   Olopatadine-Mometasone (RYALTRIS) [id number] MCG/ACT SUSP Place 1-2 sprays into the nose in the morning and at bedtime.   simvastatin (ZOCOR) 20 MG tablet TAKE 1 TABLET(20 MG) BY MOUTH DAILY   [DISCONTINUED] carvedilol (COREG) 25 MG tablet TAKE 1 TABLET(25 MG) BY MOUTH TWICE DAILY     Allergies:   Codeine and Tramadol   Social History   Socioeconomic History   Marital status:  Widowed    Spouse name: Not on file   Number of children: Not on file   Years of education: Not on file   Highest education level: Not on file  Occupational History   Not on file  Tobacco Use   Smoking status: Former   Smokeless tobacco: Never   Tobacco comments:    Smoked for 6 years, Quit at age 43  Vaping Use   Vaping Use: Never used  Substance and Sexual Activity   Alcohol use: Never   Drug use: Never   Sexual activity: Not Currently  Other Topics Concern   Not on file  Social History Narrative   Diet      Do you drink/eat things with caffeine: Yes      Marital Status: Widowed  What year were you married? 1972      Do you live in a house, apartment, assisted living, condo, trailer, etc.? Townhouse      Is it one or more stories? 2      How many persons live in your home? 3         Do you have any pets in your home?(please list): Yes, dog and fish      Highest level of education completed: High School      Current or past profession: Day care owner, foster parent      Do you exercise?: sometimes Type and how often: Walking      Living Will? No      DNR form? No  If not, do you wish to discuss one?: Yes      POA/HPOA forms? No      Difficulty bathing or dressing yourself? No      Difficulty preparing food or eating? No      Difficulty managing medications? No      Difficulty managing your finances? No      Difficulty affording your medications? No                     Social Determinants of Health   Financial Resource Strain: Low Risk  (12/09/2017)   Overall Financial Resource Strain (CARDIA)    Difficulty of Paying Living Expenses: Not hard at all  Food Insecurity: No Food Insecurity (12/09/2017)   Hunger Vital Sign    Worried About Running Out of Food in the Last Year: Never true    Ran Out of Food in the Last Year: Never true  Transportation Needs: No Transportation Needs (12/09/2017)   PRAPARE - Administrator, Civil Service  (Medical): No    Lack of Transportation (Non-Medical): No  Physical Activity: Unknown (09/08/2018)   Exercise Vital Sign    Days of Exercise per Week: 1 day    Minutes  of Exercise per Session: Not on file  Stress: No Stress Concern Present (12/09/2017)   [place]    Feeling of Stress : Not at all  Social Connections: Not on file     Family History: The patient's family history includes Hypertension in her father; Tuberculosis in her mother.  ROS:   Please see the history of present illness.     All other systems reviewed and are negative.  EKGs/Labs/Other Studies Reviewed:    The following studies were reviewed today:   EKG:  EKG is  ordered today.  The ekg ordered today demonstrates   05/01/2021- NSR, PACs  01/19/2022- NSR with 1st degree AV block  Recent Labs: 08/26/2021: ALT 11; BUN 21; Creat 1.05; Potassium 5.1; Sodium 139; TSH 1.02 02/24/2022: Hemoglobin 13.4; Platelets 254   Recent Lipid Panel    Component Value Date/Time   CHOL 173 08/26/2021 1039   TRIG 67 08/26/2021 1039   HDL 66 08/26/2021 1039   CHOLHDL 2.6 08/26/2021 1039   LDLCALC 92 08/26/2021 1039     Risk Assessment/Calculations:           Physical Exam:    VS:  Vitals:   03/03/22 0931  BP: 126/80  Pulse: 92  SpO2: 98%     Wt Readings from Last 3 Encounters:  03/03/22 174 lb 12.8 oz (79.3 kg)  02/24/22 177 lb 9.6 oz (80.6 kg)  01/19/22 175 lb (79.4 kg)     GEN:  Well nourished, well developed in no acute distress HEENT: Normal NECK: No JVD; No carotid bruits LYMPHATICS: No lymphadenopathy CARDIAC: RRR, SEM RUSB, rubs, gallops RESPIRATORY:  Clear to auscultation without rales, wheezing or rhonchi  ABDOMEN: Soft, non-tender, non-distended MUSCULOSKELETAL:  No edema; No deformity  SKIN: Warm and dry NEUROLOGIC:  Alert and oriented x 3 PSYCHIATRIC:  Normal affect   ASSESSMENT:   #Dizziness: related to BPPV. If  persistent and not related to position, can consider carotid duplex  #Palpitations: minimal SVT, PVCs. Will do coreg  #Aortic Stenosis/Mild-Moderate: She had normal SPECT. Echo showed mild-moderate MR and mild-mod AS. E/e' 11 mildly elevated. Suspect this is related to her aortic stenosis. She does not report any syncopal events. Her mean gradient was 10 mmHg but the valve is calcified; may not have captured the true peak velocity. We discussed DOE may be related to deconditioning as well; which does improve with exercise. No changes today.  #Mild MR: follow up echo surveillance in 2-3 years  with symptoms would do sooner  #HTN: typically controlled at home. Was on atenolol, plan was to switch to coreg. Can plan to start coreg at a lower dose.Continue olmesartan 40 mg daily.  Recommended ambulatory BP monitoring  PLAN:    In order of problems listed above:  Coreg 3.125 mg BID Follow up in 3 months  Medication Adjustments/Labs and Tests Ordered: Current medicines are reviewed at length with the patient today.  Concerns regarding medicines are outlined above.  No orders of the defined types were placed in this encounter.  Meds ordered this encounter  Medications   carvedilol (COREG) 3.125 MG tablet    Sig: Take 1 tablet (3.125 mg total) by mouth 2 (two) times daily.    Dispense:  180 tablet    Refill:  3    Patient Instructions  Medication Instructions:  Your physician has recommended you make the following change in your medication:  DECREASE: Carvedilol 3.125 twice daily  *If you need  a refill on your cardiac medications before your next appointment, please call your pharmacy*   Lab Work: NONE If you have labs (blood work) drawn today and your tests are completely normal, you will receive your results only by: [place] (if you have MyChart) OR A paper copy in the mail If you have any lab test that is abnormal or we need to change your treatment, we will call you to  review the results.   Testing/Procedures: NONE   Follow-Up: At [facility], you and your health needs are our priority.  As part of our continuing mission to provide you with exceptional heart care, we have created designated Provider Care Teams.  These Care Teams include your primary Cardiologist (physician) and Advanced Practice Providers (APPs -  Physician Assistants and Nurse Practitioners) who all work together to provide you with the care you need, when you need it.  We recommend signing up for the patient portal called "MyChart".  Sign up information is provided on this After Visit Summary.  MyChart is used to connect with patients for Virtual Visits (Telemedicine).  Patients are able to view lab/test results, encounter notes, upcoming appointments, etc.  Non-urgent messages can be sent to your provider as well.   To learn more about what you can do with MyChart, go to [email or web address].    Your next appointment:   3 month(s)  Provider:   [name], MD      Signed, [name], MD  03/03/2022 10:23 AM    [place]

## 2022-03-09 NOTE — Progress Notes (Unsigned)
Follow Up Note  RE: Ashley Johns MRN: [id number] DOB: July 13, 1935 Date of Office Visit: 03/10/2022  Referring provider: Caesar Bookman, NP Primary care provider: Caesar Bookman, NP  Chief Complaint: No chief complaint on file.  History of Present Illness: I had the pleasure of seeing Ashley Johns for a [facility]. She is a 87 y.o. female, who is being followed for allergic rhinitis, rash and dyspnea on exertion. Her previous allergy office visit was on 09/04/2021 with Dr. Selena Batten. Today is a regular follow up visit.  Why epi?  Perennial allergic rhinitis Past history - Perennial rhinitis x 4 years. Tried antihistamines and ipratropium with some benefit. No prior ENT evaluation. 2023 skin testing showed: Positive to dust mites and cockroaches.  Interim history - still having symptoms but only using Atrovent nasal spray. Continue environmental control measures as below. Use over the counter antihistamines such as Zyrtec (cetirizine), Claritin (loratadine), Allegra (fexofenadine), or Xyzal (levocetirizine) daily as needed. May switch antihistamines every few months. Start Ryaltris (olopatadine + mometasone nasal spray combination) 1-2 sprays per nostril twice a day. Sample given. This replaces your other nasal sprays. If this works well for you, then have [facility] ship the medication to your home - prescription already sent in.  If it's not covered let me know.  Nasal saline spray (i.e., Simply Saline) or nasal saline lavage (i.e., NeilMed) is recommended as needed and prior to medicated nasal sprays. If no improvement will recommend ENT evaluation next.    Rash and other nonspecific skin eruption Past history - Rash x 2 years on arms and legs. Flares about once per month and uses mometasone prn with good benefit. Patient thought she saw dermatology in the past and was told to use eczema lotion.  Interim history - slightly better.   Continue proper skin care.  Make sure you are using things without fragrances and scents.    Dyspnea on exertion Past history - Uses albuterol prn with some benefit - mainly with exertion. Follows with cardiology. 2023 spirometry showed: severe restrictive disease with 6% improvement in FEV1 post bronchodilator treatment. Clinically feeling improved.  Interim history - has mild/moderate aortic stenosis per cards. Only using albuterol with going up the stairs at times with good benefit.  May use albuterol rescue inhaler 2 puffs every 4 to 6 hours as needed for shortness of breath, chest tightness, coughing, and wheezing. Monitor frequency of use.    Return in about 6 months (around 03/07/2022).  Assessment and Plan: Ashley Johns is a 87 y.o. female with: No problem-specific Assessment & Plan notes found for this encounter.  No follow-ups on file.  No orders of the defined types were placed in this encounter.  Lab Orders  No laboratory test(s) ordered today    Diagnostics: Spirometry:  Tracings reviewed. Her effort: {Blank single:19197::"Good reproducible efforts.","It was hard to get consistent efforts and there is a question as to whether this reflects a maximal maneuver.","Poor effort, data can not be interpreted."} FVC: ***L FEV1: ***L, ***% predicted FEV1/FVC ratio: ***% Interpretation: {Blank single:19197::"Spirometry consistent with mild obstructive disease","Spirometry consistent with moderate obstructive disease","Spirometry consistent with severe obstructive disease","Spirometry consistent with possible restrictive disease","Spirometry consistent with mixed obstructive and restrictive disease","Spirometry uninterpretable due to technique","Spirometry consistent with normal pattern","No overt abnormalities noted given today's efforts"}.  Please see scanned spirometry results for details.  Skin Testing: {Blank single:19197::"Select foods","Environmental allergy panel","Environmental  allergy panel and select foods","Food allergy panel","None","Deferred due  to recent antihistamines use"}. *** Results discussed with patient/family.   Medication List:  Current Outpatient Medications  Medication Sig Dispense Refill   acetaminophen (TYLENOL) 650 MG CR tablet Take 650 mg by mouth every 8 (eight) hours as needed for pain.     carvedilol (COREG) 3.125 MG tablet Take 1 tablet (3.125 mg total) by mouth 2 (two) times daily. 180 tablet 3   cetirizine (ZYRTEC) 10 MG tablet Take 10 mg by mouth as needed for allergies.     EPINEPHrine (EPIPEN 2-PAK) 0.3 mg/0.3 mL IJ SOAJ injection Inject 0.3 mg into the muscle as needed. Inject 0.3mg  intramuscular route once as needed. 1 each 1   fluticasone (FLONASE) 50 MCG/ACT nasal spray SHAKE LIQUID AND USE 1 SPRAY IN EACH NOSTRIL TWICE DAILY AS NEEDED FOR NASAL CONGESTION 16 g 5   mometasone (ELOCON) 0.1 % cream Apply 1 Application topically daily as needed. 45 g 3   olmesartan (BENICAR) 40 MG tablet Take 1 tablet (40 mg total) by mouth daily. 90 tablet 1   Olopatadine-Mometasone (RYALTRIS) 665-25 MCG/ACT SUSP Place 1-2 sprays into the nose in the morning and at bedtime. 29 g 5   simvastatin (ZOCOR) 20 MG tablet TAKE 1 TABLET(20 MG) BY MOUTH DAILY 90 tablet 3   No current facility-administered medications for this visit.   Allergies: Allergies  Allergen Reactions   Codeine Nausea Only    unknown   Tramadol Nausea Only   I reviewed her past medical history, social history, family history, and environmental history and no significant changes have been reported from her previous visit.  Review of Systems  Constitutional:  Negative for appetite change, chills, fever and unexpected weight change.  HENT:  Positive for congestion, postnasal drip and rhinorrhea.   Eyes:  Negative for itching.  Respiratory:  Negative for cough, chest tightness, shortness of breath and wheezing.   Cardiovascular:  Negative for chest pain.  Gastrointestinal:   Negative for abdominal pain.  Genitourinary:  Negative for difficulty urinating.  Skin:  Negative for rash.  Allergic/Immunologic: Positive for environmental allergies. Negative for food allergies.  Neurological:  Negative for headaches.    Objective: There were no vitals taken for this visit. There is no height or weight on file to calculate BMI. Physical Exam Vitals and nursing note reviewed.  Constitutional:      Appearance: Normal appearance. She is well-developed.  HENT:     Head: Normocephalic and atraumatic.     Right Ear: Tympanic membrane and external ear normal.     Left Ear: Tympanic membrane and external ear normal.     Nose: Nose normal.     Mouth/Throat:     Mouth: Mucous membranes are moist.     Pharynx: Oropharynx is clear.  Eyes:     Conjunctiva/sclera: Conjunctivae normal.  Cardiovascular:     Rate and Rhythm: Normal rate and regular rhythm.     Heart sounds: Normal heart sounds. No murmur heard.    No friction rub. No gallop.  Pulmonary:     Effort: Pulmonary effort is normal.     Breath sounds: Normal breath sounds. No wheezing, rhonchi or rales.  Musculoskeletal:     Cervical back: Neck supple.  Skin:    General: Skin is warm.     Findings: Rash present.     Comments: Some hyperpigmentation on the arms b/l.  Neurological:     Mental Status: She is alert and oriented to person, place, and time.  Psychiatric:  Behavior: Behavior normal.    Previous notes and tests were reviewed. The plan was reviewed with the patient/family, and all questions/concerned were addressed.  It was my pleasure to see Ashley Johns today and participate in her care. Please feel free to contact me with any questions or concerns.  Sincerely,  Rexene Alberts, DO Allergy & Immunology  [facility] office: [place] office: [phone]

## 2022-03-10 ENCOUNTER — Encounter: Payer: Self-pay | Admitting: Allergy

## 2022-03-10 ENCOUNTER — Ambulatory Visit: Payer: Medicare Other | Admitting: Allergy

## 2022-03-10 VITALS — BP 130/86 | HR 68 | Temp 98.2°F | Resp 16 | Ht 61.5 in | Wt 179.0 lb

## 2022-03-10 DIAGNOSIS — J300 Vasomotor rhinitis: Secondary | ICD-10-CM | POA: Diagnosis not present

## 2022-03-10 DIAGNOSIS — R21 Rash and other nonspecific skin eruption: Secondary | ICD-10-CM

## 2022-03-10 DIAGNOSIS — R0609 Other forms of dyspnea: Secondary | ICD-10-CM | POA: Diagnosis not present

## 2022-03-10 DIAGNOSIS — J3089 Other allergic rhinitis: Secondary | ICD-10-CM | POA: Diagnosis not present

## 2022-03-10 MED ORDER — ALBUTEROL SULFATE HFA 108 (90 BASE) MCG/ACT IN AERS: 2.0000 | INHALATION_SPRAY | RESPIRATORY_TRACT | 1 refills | Status: DC | PRN

## 2022-03-10 NOTE — Assessment & Plan Note (Signed)
Past history - Uses albuterol prn with some benefit - mainly with exertion. Follows with cardiology. 2023 spirometry showed: severe restrictive disease with 6% improvement in FEV1 post bronchodilator treatment. Clinically feeling improved. Mild/moderate aortic stenosis per cards.  Interim history - using albuterol 3-4 times per week due to DOE. Discussed with patient and daughter in length that her dyspnea on exertion is most likely due to physical deconditioning. Advised her to wait a few minutes after exertion and if symptoms resolve there is no need to use the albuterol inhaler.  Otherwise if symptoms do not improve after 5 minutes then: May use albuterol rescue inhaler 2 puffs every 4 to 6 hours as needed for shortness of breath, chest tightness, coughing, and wheezing. Monitor frequency of use.

## 2022-03-10 NOTE — Assessment & Plan Note (Signed)
Past history - Perennial rhinitis x 4 years. Tried antihistamines and ipratropium with some benefit. No prior ENT evaluation. 2023 skin testing showed: Positive to dust mites and cockroaches.  Interim history - still has rhinorrhea. Only tried Ryaltris a few times and not sure if better than her other nasal sprays.  Most likely has a component of vasomotor rhinitis.  Continue environmental control measures as below. Use over the counter antihistamines such as Zyrtec (cetirizine), Claritin (loratadine), Allegra (fexofenadine), or Xyzal (levocetirizine) daily as needed.  Use Flonase (fluticasone) nasal spray 1 spray per nostril twice a day as needed for nasal congestion.  Use Atrovent (ipratropium) 0.03% 1-2 sprays per nostril twice a day as needed for runny nose/drainage. Nasal saline spray (i.e., Simply Saline) or nasal saline lavage (i.e., NeilMed) is recommended as needed and prior to medicated nasal sprays. If no improvement will refer to ENT next - declined referral today.

## 2022-03-10 NOTE — Assessment & Plan Note (Signed)
Past history - Rash x 2 years on arms and legs. Flares about once per month and uses mometasone prn with good benefit. Patient thought she saw dermatology in the past and was told to use eczema lotion.  Interim history - asymptomatic.  Continue proper skin care.  Make sure you are using things without fragrances and scents.

## 2022-03-10 NOTE — Patient Instructions (Addendum)
Environmental allergies 2023 skin testing showed: Positive to dust mites and cockroaches.  Continue environmental control measures as below. Use over the counter antihistamines such as Zyrtec (cetirizine), Claritin (loratadine), Allegra (fexofenadine), or Xyzal (levocetirizine) daily as needed. May switch antihistamines every few months. May use Flonase 1 spray per nostril 1-2 times a day as needed.  Use Atrovent (ipratropium) 0.03% 1-2 sprays per nostril twice a day as needed for runny nose/drainage. Nasal saline spray (i.e., Simply Saline) or nasal saline lavage (i.e., NeilMed) is recommended as needed and prior to medicated nasal sprays. If no improvement will refer to ENT next.   Rash Continue roper skin care.  Make sure you are using things without fragrances and scents.   Breathing: May use albuterol rescue inhaler 2 puffs every 4 to 6 hours as needed for shortness of breath, chest tightness, coughing, and wheezing. Monitor frequency of use.   Follow up in 6 months or sooner if needed.  Control of House Dust Mite Allergen Dust mite allergens are a common trigger of allergy and asthma symptoms. While they can be found throughout the house, these microscopic creatures thrive in warm, humid environments such as bedding, upholstered furniture and carpeting. Because so much time is spent in the bedroom, it is essential to reduce mite levels there.  Encase pillows, mattresses, and box springs in special allergen-proof fabric covers or airtight, zippered plastic covers.  Bedding should be washed weekly in hot water (130 F) and dried in a hot dryer. Allergen-proof covers are available for comforters and pillows that can't be regularly washed.  Wash the allergy-proof covers every few months. Minimize clutter in the bedroom. Keep pets out of the bedroom.  Keep humidity less than 50% by using a dehumidifier or air conditioning. You can buy a humidity measuring device called a hygrometer to  monitor this.  If possible, replace carpets with hardwood, linoleum, or washable area rugs. If that's not possible, vacuum frequently with a vacuum that has a HEPA filter. Remove all upholstered furniture and non-washable window drapes from the bedroom. Remove all non-washable stuffed toys from the bedroom.  Wash stuffed toys weekly.  Cockroach Allergen Avoidance Cockroaches are often found in the homes of densely populated urban areas, schools or commercial buildings, but these creatures can lurk almost anywhere. This does not mean that you have a dirty house or living area. Block all areas where roaches can enter the home. This includes crevices, wall cracks and windows.  Cockroaches need water to survive, so fix and seal all leaky faucets and pipes. Have an exterminator go through the house when your family and pets are gone to eliminate any remaining roaches. Keep food in lidded containers and put pet food dishes away after your pets are done eating. Vacuum and sweep the floor after meals, and take out garbage and recyclables. Use lidded garbage containers in the kitchen. Wash dishes immediately after use and clean under stoves, refrigerators or toasters where crumbs can accumulate. Wipe off the stove and other kitchen surfaces and cupboards regularly.  Skin care recommendations  Bath time: Always use lukewarm water. AVOID very hot or cold water. Keep bathing time to 5-10 minutes. Do NOT use bubble bath. Use a mild soap and use just enough to wash the dirty areas. Do NOT scrub skin vigorously.  After bathing, pat dry your skin with a towel. Do NOT rub or scrub the skin.  Moisturizers and prescriptions:  ALWAYS apply moisturizers immediately after bathing (within 3 minutes). This helps to lock-in  moisture. Use the moisturizer several times a day over the whole body. Good summer moisturizers include: Aveeno, CeraVe, Cetaphil. Good winter moisturizers include: Aquaphor, Vaseline, Cerave,  Cetaphil, Eucerin, Vanicream. When using moisturizers along with medications, the moisturizer should be applied about one hour after applying the medication to prevent diluting effect of the medication or moisturize around where you applied the medications. When not using medications, the moisturizer can be continued twice daily as maintenance.  Laundry and clothing: Avoid laundry products with added color or perfumes. Use unscented hypo-allergenic laundry products such as Tide free, Cheer free & gentle, and All free and clear.  If the skin still seems dry or sensitive, you can try double-rinsing the clothes. Avoid tight or scratchy clothing such as wool. Do not use fabric softeners or dyer sheets.

## 2022-03-30 ENCOUNTER — Telehealth: Payer: Self-pay

## 2022-03-30 DIAGNOSIS — M79605 Pain in left leg: Secondary | ICD-10-CM | POA: Diagnosis not present

## 2022-03-30 NOTE — Telephone Encounter (Signed)
Message left on clinical intake voicemail:   Patient with left leg pain in calf area that onset on Sunday. Patient has tried OTC 650 mg tylenol with no relief. Patient is questioning what to do. We do not have any openings today.   I returned call to patient and scheduled an appointment for tomorrow. Patient aware to seek assistance at urgent care or the emergency room if care is need prior to appointment. I will send this message to NP covering for [name] to review and advise if additional advice, recommendations, or directives to be given.

## 2022-03-30 NOTE — Telephone Encounter (Signed)
Below is Monina's response:   Medina-Vargas, Monina C, NP  You1 hour ago (1:36 PM)    Yes, patient needs to be seen in person to assess the problem.

## 2022-03-31 ENCOUNTER — Ambulatory Visit: Payer: Medicare Other | Admitting: Adult Health

## 2022-04-03 ENCOUNTER — Other Ambulatory Visit: Payer: Self-pay | Admitting: Family

## 2022-04-23 ENCOUNTER — Ambulatory Visit (INDEPENDENT_AMBULATORY_CARE_PROVIDER_SITE_OTHER): Payer: Medicare Other | Admitting: Family

## 2022-04-23 ENCOUNTER — Encounter: Payer: Self-pay | Admitting: Family

## 2022-04-23 VITALS — BP 122/88 | HR 88 | Temp 97.9°F | Resp 16 | Ht 61.5 in | Wt 173.2 lb

## 2022-04-23 DIAGNOSIS — M79605 Pain in left leg: Secondary | ICD-10-CM

## 2022-04-23 NOTE — Progress Notes (Signed)
Provider: Marlowe Sax FNP-C  Azuri Bozard, Nelda Bucks, NP  Patient Care Team: Toma Arts, Nelda Bucks, NP as PCP - General (Family Medicine) [name], MD as PCP - Cardiology (Cardiology) Harriett Sine, MD as Consulting Physician (Dermatology)  Extended Emergency Contact Information Primary Emergency Contact: [name] [place] Phone: [phone] Relation: Daughter Secondary Emergency Contact: booker,melonie [place] Phone: [phone] Relation: Granddaughter  Code Status:  DNR Goals of care: Advanced Directive information    02/24/2022    3:05 PM  Advanced Directives  Does Patient Have a Medical Advance Directive? Yes  Type of Advance Directive [place]  Does patient want to make changes to medical advance directive? No - Patient declined  Copy of [place] in Chart? No - copy requested     Chief Complaint  Patient presents with   Leg Pain    Left Leg pain x 2 weeks. No injury noted. Has been seen at urgent care they did xray and they gave her pain killers for night if pain kept her up. No better. Worse at night and lingers throughout the day.     HPI:  Pt is a 87 y.o. female seen today for an acute visit for evaluation of left leg pain x 2 weeks.states pain shots from lower leg to hip area.Pain described as intermittent.states went to [facility] close to her place was given extra strength tylenol but not helping with the pain.   She denies any numbness, tingling or weakness on left leg.  Past Medical History:  Diagnosis Date   Eczema    Per [place] new patient packet   Graves disease    Per [place] new patient packet   High blood pressure    Per [facility] new patient packet   Hypertension    Past Surgical History:  Procedure Laterality Date   TOTAL ABDOMINAL HYSTERECTOMY Bilateral 1970's   TUBAL LIGATION      Allergies  Allergen Reactions   Codeine Nausea Only    unknown   Tramadol  Nausea Only    Outpatient Encounter Medications as of 04/23/2022  Medication Sig   acetaminophen (TYLENOL) 650 MG CR tablet Take 650 mg by mouth every 8 (eight) hours as needed for pain.   albuterol (VENTOLIN HFA) 108 (90 Base) MCG/ACT inhaler Inhale 2 puffs into the lungs every 4 (four) hours as needed for wheezing or shortness of breath (coughing fits).   carvedilol (COREG) 3.125 MG tablet Take 1 tablet (3.125 mg total) by mouth 2 (two) times daily.   cetirizine (ZYRTEC) 10 MG tablet Take 10 mg by mouth as needed for allergies.   Dextromethorphan-guaiFENesin ([place] DM PO) Take by mouth as needed.   EPINEPHrine (EPIPEN 2-PAK) 0.3 mg/0.3 mL IJ SOAJ injection Inject 0.3 mg into the muscle as needed. Inject 0.'3mg'$  intramuscular route once as needed.   fluticasone (FLONASE) 50 MCG/ACT nasal spray SHAKE LIQUID AND USE 1 SPRAY IN EACH NOSTRIL TWICE DAILY AS NEEDED FOR NASAL CONGESTION   mometasone (ELOCON) 0.1 % cream Apply 1 Application topically daily as needed.   olmesartan (BENICAR) 40 MG tablet Take 1 tablet (40 mg total) by mouth daily.   simvastatin (ZOCOR) 20 MG tablet TAKE 1 TABLET(20 MG) BY MOUTH DAILY   No facility-administered encounter medications on file as of 04/23/2022.    Review of Systems  Constitutional:  Negative for appetite change, chills, fatigue and fever.  Musculoskeletal:  Positive for arthralgias and gait problem. Negative for joint  swelling.       Left leg pain   Skin:  Negative for color change, pallor, rash and wound.  Neurological:  Negative for dizziness, tremors, syncope, weakness, numbness and headaches.    Immunization History  Administered Date(s) Administered   Fluad Quad(high Dose 65+) 10/11/2018   Influenza, High Dose Seasonal PF 12/03/2015, 11/07/2019   Influenza,inj,Quad PF,6+ Mos 10/11/2018   Influenza-Unspecified 11/16/2017, 11/27/2020   PFIZER Comirnaty(Gray Top)Covid-19 Tri-Sucrose Vaccine 07/31/2020   PFIZER(Purple Top)SARS-COV-2 Vaccination  02/18/2019, 03/11/2019, 11/29/2019, 07/31/2020   Pneumococcal Conjugate-13 10/11/2018   Td 10/11/2018   Tdap 10/11/2018   Pertinent  Health Maintenance Due  Topic Date Due   INFLUENZA VACCINE  09/09/2021   DEXA SCAN  Completed      06/12/2021   10:43 AM 08/15/2021    9:49 AM 08/22/2021    9:06 AM 08/26/2021    9:28 AM 02/24/2022    3:01 PM  Fall Risk  Falls in the past year? 0 0 0 0 0  Was there an injury with Fall? 0 0 0 0 0  Fall Risk Category Calculator 0 0 0 0 0  Fall Risk Category (Retired) Low Low Low Low   (RETIRED) Patient Fall Risk Level Low fall risk Low fall risk Low fall risk Low fall risk   Patient at Risk for Falls Due to No Fall Risks No Fall Risks No Fall Risks No Fall Risks No Fall Risks  Fall risk Follow up Falls evaluation completed Falls evaluation completed Falls evaluation completed Falls evaluation completed Falls evaluation completed   Functional Status Survey:    Vitals:   04/23/22 1452  BP: 122/88  Pulse: 88  Resp: 16  Temp: 97.9 F (36.6 C)  TempSrc: Temporal  SpO2: 93%  Weight: 173 lb 3.2 oz (78.6 kg)  Height: 5' 1.5" (1.562 m)   Body mass index is 32.2 kg/m. Physical Exam Vitals reviewed.  Constitutional:      General: She is not in acute distress.    Appearance: Normal appearance. She is normal weight. She is not ill-appearing or diaphoretic.  HENT:     Head: Normocephalic.  Eyes:     General: No scleral icterus.       Right eye: No discharge.        Left eye: No discharge.     Conjunctiva/sclera: Conjunctivae normal.     Pupils: Pupils are equal, round, and reactive to light.  Cardiovascular:     Rate and Rhythm: Normal rate and regular rhythm.     Pulses: Normal pulses.     Heart sounds: Normal heart sounds. No murmur heard.    No friction rub. No gallop.  Pulmonary:     Effort: Pulmonary effort is normal. No respiratory distress.     Breath sounds: Normal breath sounds. No wheezing, rhonchi or rales.  Chest:     Chest wall: No  tenderness.  Abdominal:     General: Bowel sounds are normal. There is no distension.     Palpations: Abdomen is soft. There is no mass.     Tenderness: There is no abdominal tenderness. There is no right CVA tenderness, left CVA tenderness, guarding or rebound.  Musculoskeletal:        General: No swelling or tenderness. Normal range of motion.     Right lower leg: No edema.     Left lower leg: No edema.     Comments: Left lateral lower leg without any edema or tenderness to palpation.  Skin:  General: Skin is warm and dry.     Coloration: Skin is not pale.     Findings: No erythema or rash.  Neurological:     Mental Status: She is alert and oriented to person, place, and time.     Cranial Nerves: No cranial nerve deficit.     Sensory: No sensory deficit.     Motor: No weakness.     Coordination: Coordination normal.     Gait: Gait normal.     Comments: HOH   Psychiatric:        Mood and Affect: Mood normal.        Speech: Speech normal.        Behavior: Behavior normal.     Labs reviewed: Recent Labs    08/26/21 1039  NA 139  K 5.1  CL 103  CO2 30  GLUCOSE 99  BUN 21  CREATININE 1.05*  CALCIUM 9.6   Recent Labs    08/26/21 1039  AST 13  ALT 11  BILITOT 0.5  PROT 7.9   Recent Labs    08/26/21 1039 02/24/22 1546  WBC 5.2 6.2  NEUTROABS 3,198 3,460  HGB 13.8 13.4  HCT 41.7 39.6  MCV 91.0 91.2  PLT 229 254   Lab Results  Component Value Date   TSH 1.02 08/26/2021   Lab Results  Component Value Date   HGBA1C 5.8 (H) 08/26/2021   Lab Results  Component Value Date   CHOL 173 08/26/2021   HDL 66 08/26/2021   LDLCALC 92 08/26/2021   TRIG 67 08/26/2021   CHOLHDL 2.6 08/26/2021    Significant Diagnostic Results in last 30 days:  No results found.  Assessment/Plan  Left leg pain Reports intermittent pain on Left lateral lower leg without any edema or tenderness to palpation. - unable to access X-ray done at [facility] has signed a  release medical information to be faxed to [facility]  - advised to take OTC Aleve with food. - Ambulatory referral to Orthopedics  Family/ staff Communication: Reviewed plan of care with patient and daughter verbalized understanding.   Labs/tests ordered: None   Next Appointment: Return if symptoms worsen or fail to improve.   Sandrea Hughs, NP

## 2022-04-29 ENCOUNTER — Encounter: Payer: Self-pay | Admitting: Family

## 2022-04-29 ENCOUNTER — Ambulatory Visit (INDEPENDENT_AMBULATORY_CARE_PROVIDER_SITE_OTHER): Payer: Medicare Other | Admitting: Family

## 2022-04-29 ENCOUNTER — Other Ambulatory Visit (INDEPENDENT_AMBULATORY_CARE_PROVIDER_SITE_OTHER): Payer: Medicare Other

## 2022-04-29 DIAGNOSIS — M5442 Lumbago with sciatica, left side: Secondary | ICD-10-CM

## 2022-04-29 DIAGNOSIS — G8929 Other chronic pain: Secondary | ICD-10-CM

## 2022-04-29 MED ORDER — PREDNISONE 50 MG PO TABS: ORAL_TABLET | ORAL | 0 refills | Status: DC

## 2022-04-29 NOTE — Progress Notes (Signed)
Office Visit Note   Patient: Ashley Johns           Date of Birth: 1935/04/17           MRN: [id number] Visit Date: 04/29/2022              Requested by: Sandrea Hughs, NP [address] [place],  [place] [address] PCP: Sandrea Hughs, NP  Chief Complaint  Patient presents with   Lower Back - Pain      HPI: The patient is a 87 year old woman who presents today complaining of left leg pain which has been ongoing for nearly 3 weeks this is shooting from her calf up to her buttock and thigh.  She denies any numbness or tingling or heaviness denies weakness she states she woke 1 morning with this pain and this has been waxing and waning since.  Difficulty with sleep due to pain  Has been using extra strength Tylenol without relief  Has previously had radiographs of the left lower extremity which were negative.  Assessment & Plan: Visit Diagnoses:  1. Chronic left-sided low back pain with left-sided sciatica     Plan: Prednisone burst given.  She will call in 1 week if she fails to improve consider MRI versus ESI at that time.  Follow-Up Instructions: No follow-ups on file.   Back Exam   Tenderness  The patient is experiencing tenderness in the lumbar.  Range of Motion  The patient has normal back ROM.  Muscle Strength  The patient has normal back strength.  Tests  Straight leg raise right: negative Straight leg raise left: positive  Other  Gait: normal       Patient is alert, oriented, no adenopathy, well-dressed, normal affect, normal respiratory effort.   Imaging: No results found. No images are attached to the encounter.  Labs: Lab Results  Component Value Date   HGBA1C 5.8 (H) 08/26/2021   HGBA1C 6.0 (H) 02/21/2021   HGBA1C 5.9 (H) 08/21/2020     No results found for: "ALBUMIN", "PREALBUMIN", "CBC"  No results found for: "MG" No results found for: "VD25OH"  No results found for: "PREALBUMIN"    Latest Ref Rng & Units 02/24/2022     3:46 PM 08/26/2021   10:39 AM 02/21/2021   10:34 AM  CBC EXTENDED  WBC 3.8 - 10.8 Thousand/uL 6.2  5.2  5.8   RBC 3.80 - 5.10 Million/uL 4.34  4.58  4.45   Hemoglobin 11.7 - 15.5 g/dL 13.4  13.8  13.1   HCT 35.0 - 45.0 % 39.6  41.7  40.3   Platelets 140 - 400 Thousand/uL 254  229  267   NEUT# 1,500 - 7,800 cells/uL 3,460  3,198  3,845   Lymph# 850 - 3,900 cells/uL 1,742  1,399  1,346      There is no height or weight on file to calculate BMI.  Orders:  Orders Placed This Encounter  Procedures   XR Lumbar Spine 2-3 Views   No orders of the defined types were placed in this encounter.    Procedures: No procedures performed  Clinical Data: No additional findings.  ROS:  All other systems negative, except as noted in the HPI. Review of Systems  Objective: Vital Signs: There were no vitals taken for this visit.  Specialty Comments:  No specialty comments available.  PMFS History: Patient Active Problem List   Diagnosis Date Noted   Perennial allergic rhinitis/vasomotor rhinitis 06/05/2021   Rash and other  nonspecific skin eruption 06/05/2021   Dyspnea on exertion 06/05/2021   PAC (premature atrial contraction) 04/23/2021   Hyperlipidemia LDL goal <100 09/12/2019   Graves disease 09/12/2019   Seasonal allergies 09/12/2019   Sensorineural hearing loss (SNHL) of both ears 09/12/2019   Abnormal kidney function 06/09/2018   Essential hypertension 12/24/2017   Prediabetes 12/24/2017   Common cold 11/29/2017   Cough 11/29/2017   Past Medical History:  Diagnosis Date   Eczema    Per [facility] new patient packet   Graves disease    Per [place] new patient packet   High blood pressure    Per [facility] new patient packet   Hypertension     Family History  Problem Relation Age of Onset   Tuberculosis Mother    Hypertension Father     Past Surgical History:  Procedure Laterality Date   TOTAL ABDOMINAL HYSTERECTOMY Bilateral 1970's   TUBAL LIGATION     Social History    Occupational History   Not on file  Tobacco Use   Smoking status: Former   Smokeless tobacco: Never   Tobacco comments:    Smoked for 6 years, Quit at age 49  Vaping Use   Vaping Use: Never used  Substance and Sexual Activity   Alcohol use: Never   Drug use: Never   Sexual activity: Not Currently

## 2022-05-05 ENCOUNTER — Telehealth: Payer: Self-pay

## 2022-05-05 NOTE — Telephone Encounter (Signed)
Please schedule follow up appointment with Orthopedic to re-evaluate leg pain.

## 2022-05-05 NOTE — Telephone Encounter (Signed)
Patient left message on clinicl intake voicemail staitng that sdhe is stil having left leg pain. She finished prednisone and leg still hurts. They told her they could give her a shot. She says if that is what she has to do then that's what she has to do. Please advise.  Message sen to Marlowe Sax, NP

## 2022-05-11 ENCOUNTER — Other Ambulatory Visit: Payer: Self-pay | Admitting: Family

## 2022-05-11 DIAGNOSIS — I10 Essential (primary) hypertension: Secondary | ICD-10-CM

## 2022-06-02 ENCOUNTER — Ambulatory Visit: Payer: Medicare Other | Attending: Internal Medicine | Admitting: Internal Medicine

## 2022-06-02 NOTE — Progress Notes (Deleted)
Cardiology Office Note:    Date:  06/02/2022   ID:  DOAA KENDZIERSKI, DOB 1935/12/01, MRN [id number]  PCP:  Caesar Bookman, NP   [facility] Providers Cardiologist:  Maisie Fus, MD     Referring MD: Caesar Bookman, NP   No chief complaint on file. DOE  History of Present Illness:    Ashley Johns is a 87 y.o. female with a hx of graves dx s/p thyroidecotmy, htn referral from [facility] referral for SOB for 2-3 months  She notes SOB with walking upstairs. She then rests. Just walking to the garbage can she gets SOB. She did note some chest pressure with walking. Last week it was worse. She's had a lot of coughing. Daughter notes she has seasonal allergies. No LE edema. She's lost weight. She denies orthopnea, PND.  Has not seen a cardiologist. No hx of stress test.  No cath. TSH is normal.  Former smoker. She started in her 20s-30s.   She comes in with her daughter. They are from here. She lived in [place] briefly many years ago  EKG 04/23/2021-NSR,PACs  Interim Hx: For DOE, spect myoview was completed which was low risk. Her TTE showed EF 50-55, RV was normal, mild-moderate MR (no intrinsic valve dx), Aortic mean gradient 10 mmHg with a calcified valve. Blood pressure elevated today 160/90 ; typically systolics < 140 mmHg and normal DBP. She was stressed this AM. She still has SOB. She denies PND, orthopnea, no LE edema. If she travels at a fast pace with her mask that's when she notes it. If she walks normally it is fine. She can get from her car to church without issues. [facility].   Interim 01/19/2022 She notes some palpitations yesterday and some on Saturday. She was stumbling.  Was walking when it was warmer outside. It's more challenging when it is cold. She feels better when she walks. No syncope.   Interim 03/03/2022 She was seen by her primary provider for dizziness and returns after 1 month. She noted it with turning  too quickly. Her BP is normal.  Her daughter made this appointment because she had dizziness with changing position. She was also having some SOB. She feels better now.   Interim hx 06/02/2022   Cardiology Studies:  05/21/2021- SPECT,  low risk study. No scar or inducible ischemia  05/21/2021- EF ~55%, noted hypokinesis of the inferolateral wall, don't appreciate significant hypokinesis. SPECT was nl. Aortic valve is calcified  mean gradient of 10 mmHg , mild to moderate AS. MR appears more mild  [place] 01/19/2022- Brief SVT, minimal PVCs  Past Medical History:  Diagnosis Date   Eczema    Per [facility] new patient packet   Graves disease    Per [facility] new patient packet   High blood pressure    Per [facility] new patient packet   Hypertension     Past Surgical History:  Procedure Laterality Date   TOTAL ABDOMINAL HYSTERECTOMY Bilateral 1970's   TUBAL LIGATION      Current Medications: No outpatient medications have been marked as taking for the 06/02/22 encounter (Appointment) with Maisie Fus, MD.     Allergies:   Codeine and Tramadol   Social History   Socioeconomic History   Marital status: Widowed    Spouse name: Not on file   Number of children: Not on file   Years of education: Not on file  Highest education level: Not on file  Occupational History   Not on file  Tobacco Use   Smoking status: Former   Smokeless tobacco: Never   Tobacco comments:    Smoked for 6 years, Quit at age 45  Vaping Use   Vaping Use: Never used  Substance and Sexual Activity   Alcohol use: Never   Drug use: Never   Sexual activity: Not Currently  Other Topics Concern   Not on file  Social History Narrative   Diet      Do you drink/eat things with caffeine: Yes      Marital Status: Widowed  What year were you married? 1972      Do you live in a house, apartment, assisted living, condo, trailer, etc.? Townhouse      Is it one or more stories? 2      How many persons live in your  home? 3         Do you have any pets in your home?(please list): Yes, dog and fish      Highest level of education completed: High School      Current or past profession: Day care owner, foster parent      Do you exercise?: sometimes Type and how often: Walking      Living Will? No      DNR form? No  If not, do you wish to discuss one?: Yes      POA/HPOA forms? No      Difficulty bathing or dressing yourself? No      Difficulty preparing food or eating? No      Difficulty managing medications? No      Difficulty managing your finances? No      Difficulty affording your medications? No                     Social Determinants of Health   Financial Resource Strain: Low Risk  (12/09/2017)   Overall Financial Resource Strain (CARDIA)    Difficulty of Paying Living Expenses: Not hard at all  Food Insecurity: No Food Insecurity (12/09/2017)   Hunger Vital Sign    Worried About Running Out of Food in the Last Year: Never true    Ran Out of Food in the Last Year: Never true  Transportation Needs: No Transportation Needs (12/09/2017)   PRAPARE - Administrator, Civil Service (Medical): No    Lack of Transportation (Non-Medical): No  Physical Activity: Unknown (09/08/2018)   Exercise Vital Sign    Days of Exercise per Week: 1 day    Minutes of Exercise per Session: Not on file  Stress: No Stress Concern Present (12/09/2017)   [employer] of Occupational Health - Occupational Stress Questionnaire    Feeling of Stress : Not at all  Social Connections: Not on file     Family History: The patient's family history includes Hypertension in her father; Tuberculosis in her mother.  ROS:   Please see the history of present illness.     All other systems reviewed and are negative.  EKGs/Labs/Other Studies Reviewed:    The following studies were reviewed today:   EKG:  EKG is  ordered today.  The ekg ordered today demonstrates   05/01/2021- NSR,  PACs  01/19/2022- NSR with 1st degree AV block  Recent Labs: 08/26/2021: ALT 11; BUN 21; Creat 1.05; Potassium 5.1; Sodium 139; TSH 1.02 02/24/2022: Hemoglobin 13.4; Platelets 254   Recent Lipid Panel  Component Value Date/Time   CHOL 173 08/26/2021 1039   TRIG 67 08/26/2021 1039   HDL 66 08/26/2021 1039   CHOLHDL 2.6 08/26/2021 1039   LDLCALC 92 08/26/2021 1039     Risk Assessment/Calculations:           Physical Exam:    VS:  There were no vitals filed for this visit.    Wt Readings from Last 3 Encounters:  04/23/22 173 lb 3.2 oz (78.6 kg)  03/10/22 179 lb (81.2 kg)  03/03/22 174 lb 12.8 oz (79.3 kg)     GEN:  Well nourished, well developed in no acute distress HEENT: Normal NECK: No JVD; No carotid bruits LYMPHATICS: No lymphadenopathy CARDIAC: RRR, SEM RUSB, rubs, gallops RESPIRATORY:  Clear to auscultation without rales, wheezing or rhonchi  ABDOMEN: Soft, non-tender, non-distended MUSCULOSKELETAL:  No edema; No deformity  SKIN: Warm and dry NEUROLOGIC:  Alert and oriented x 3 PSYCHIATRIC:  Normal affect   ASSESSMENT:    #Palpitations: minimal SVT, PVCs. Will do coreg  #Aortic Stenosis/Mild-Moderate: She had normal SPECT. Echo showed mild-moderate MR and mild-mod AS. E/e' 11 mildly elevated. Suspect this is related to her aortic stenosis. She does not report any syncopal events. Her mean gradient was 10 mmHg but the valve is calcified; may not have captured the true peak velocity. We discussed DOE may be related to deconditioning as well; which does improve with exercise. Can plan for continued surveillance with repeat study 2-3 years  #Mild MR: follow up echo surveillance in 2-3 years  with symptoms would do sooner  #HTN: typically controlled at home. Was on atenolol, plan was to switch to coreg. Can plan to start coreg at a lower dose.Continue olmesartan 40 mg daily.  Recommended ambulatory BP monitoring  Prior Issues #Dizziness: related to BPPV. If  persistent and not related to position, can consider carotid duplex  PLAN:    In order of problems listed above:  Follow up in 12 months ***   Medication Adjustments/Labs and Tests Ordered: Current medicines are reviewed at length with the patient today.  Concerns regarding medicines are outlined above.  No orders of the defined types were placed in this encounter.  No orders of the defined types were placed in this encounter.   There are no Patient Instructions on file for this visit.   Signed, Maisie Fus, MD  06/02/2022 8:06 AM    [facility]

## 2022-06-03 ENCOUNTER — Telehealth: Payer: Self-pay

## 2022-06-03 NOTE — Telephone Encounter (Signed)
Take mobic 7.5 mg tablet one by mouth daily. May increase to two tablets if ineffective and notify provider.

## 2022-06-03 NOTE — Telephone Encounter (Signed)
Patient's daughter called stating that patient is having pain in back and leg again. She would like to know if patient can be prescribed tramadol or meloxicam.She was given medication years ago 2016/2017 and it helped.  Message routed to Richarda Blade, NP

## 2022-06-04 NOTE — Telephone Encounter (Signed)
Please send prescription in

## 2022-06-08 ENCOUNTER — Other Ambulatory Visit: Payer: Self-pay

## 2022-06-08 MED ORDER — MELOXICAM 7.5 MG PO TABS: 7.5000 mg | ORAL_TABLET | Freq: Every day | ORAL | 1 refills | Status: DC

## 2022-06-15 ENCOUNTER — Other Ambulatory Visit: Payer: Self-pay | Admitting: Family

## 2022-06-15 DIAGNOSIS — I10 Essential (primary) hypertension: Secondary | ICD-10-CM

## 2022-07-01 ENCOUNTER — Telehealth: Payer: Self-pay

## 2022-07-01 NOTE — Telephone Encounter (Signed)
Patient's daughter called stating that patient is requesting refill on medication that helped with bladder leaks. She states that patient is having a lot of coughing with urinary leakage. I do not see medication on medication list. Patient's daughter stated that she was prescribed medication sometime last year. Please advise.   Message sent to Richarda Blade, NP

## 2022-07-02 NOTE — Telephone Encounter (Signed)
Patient previously advised to take cranberry tablets.may need to collect urine to rule out Urinary tract infection.

## 2022-07-03 ENCOUNTER — Ambulatory Visit (INDEPENDENT_AMBULATORY_CARE_PROVIDER_SITE_OTHER): Payer: Medicare Other | Admitting: Orthopedic Surgery

## 2022-07-03 ENCOUNTER — Encounter: Payer: Self-pay | Admitting: Orthopedic Surgery

## 2022-07-03 VITALS — BP 148/94 | HR 98 | Temp 97.5°F | Resp 16 | Ht 61.5 in | Wt 171.0 lb

## 2022-07-03 DIAGNOSIS — R062 Wheezing: Secondary | ICD-10-CM

## 2022-07-03 DIAGNOSIS — R030 Elevated blood-pressure reading, without diagnosis of hypertension: Secondary | ICD-10-CM | POA: Diagnosis not present

## 2022-07-03 MED ORDER — CARVEDILOL 3.125 MG PO TABS: ORAL_TABLET | ORAL | 3 refills | Status: DC

## 2022-07-03 MED ORDER — PREDNISONE 20 MG PO TABS: ORAL_TABLET | ORAL | 0 refills | Status: AC

## 2022-07-03 MED ORDER — SPIRIVA RESPIMAT 1.25 MCG/ACT IN AERS: 2.0000 | INHALATION_SPRAY | Freq: Every day | RESPIRATORY_TRACT | 5 refills | Status: DC

## 2022-07-03 MED ORDER — MONTELUKAST SODIUM 10 MG PO TABS: 10.0000 mg | ORAL_TABLET | Freq: Every day | ORAL | 3 refills | Status: DC

## 2022-07-03 MED ORDER — CLONIDINE HCL 0.1 MG PO TABS: 0.1000 mg | ORAL_TABLET | Freq: Once | ORAL | Status: DC

## 2022-07-03 NOTE — Progress Notes (Signed)
Careteam: Patient Care Team: Ngetich, Donalee Citrin, NP as PCP - General (Family Medicine) Maisie Fus, MD as PCP - Cardiology (Cardiology) Aris Lot, MD as Consulting Physician (Dermatology)  Seen by: Hazle Nordmann, AGNP-C  PLACE OF SERVICE:  [facility]  Advanced Directive information Does Patient Have a Medical Advance Directive?: Yes, Type of Advance Directive: Healthcare Power of [place];Living will;Out of facility DNR (pink MOST or yellow form), Does patient want to make changes to medical advance directive?: No - Patient declined  Allergies  Allergen Reactions   Codeine Nausea Only    unknown   Tramadol Nausea Only   Aspirin Rash    Chief Complaint  Patient presents with   Acute Visit    Patient complains of wheezing and coughing.      HPI: Patient is a 87 y.o. female seen today for acute visit due to ongoing cough and wheezing.   PMH: HTN, PAC, DOE, allergies, Graves, prediabetes, HLD, and smoking in 20's-30's.   Onset of cough 2 weeks ago. Cough sometime productive. Admits to coughing at night and she is not sleeping well. She had been taking mucinex without success. She uses albuterol inhaler when climbing stairs, averaging 3-4x/daily. No fever within the past 2 days. Her chest hurts at times when she coughs.   81% when she initially arrived, breathing non labored. We placed 2 liters oxygen on her and sats improved to 97%. Oxygen was stopped, sats remained at 93%. SBP> 180, she was given clonidine 0.1 mg po once. She did take coreg and olmesartan this morning as prescribed.    Review of Systems:  Review of Systems  Constitutional:  Negative for fever and malaise/fatigue.  HENT:  Negative for congestion and sore throat.   Eyes:  Negative for blurred vision.  Respiratory:  Positive for cough, sputum production, shortness of breath and wheezing.   Cardiovascular:  Negative for chest pain and leg swelling.  Gastrointestinal:  Negative for nausea and vomiting.   Genitourinary:  Negative for dysuria.  Musculoskeletal:  Negative for falls and myalgias.  Neurological:  Positive for weakness. Negative for dizziness and headaches.  Psychiatric/Behavioral:  Negative for depression. The patient is not nervous/anxious.     Past Medical History:  Diagnosis Date   Eczema    Per [facility] new patient packet   Graves disease    Per [facility] new patient packet   High blood pressure    Per [facility] new patient packet   Hypertension    Past Surgical History:  Procedure Laterality Date   TOTAL ABDOMINAL HYSTERECTOMY Bilateral 1970's   TUBAL LIGATION     Social History:   reports that she has quit smoking. She has never used smokeless tobacco. She reports that she does not drink alcohol and does not use drugs.  Family History  Problem Relation Age of Onset   Tuberculosis Mother    Hypertension Father     Medications: Patient's Medications  New Prescriptions   No medications on file  Previous Medications   ACETAMINOPHEN (TYLENOL) 650 MG CR TABLET    Take 650 mg by mouth every 8 (eight) hours as needed for pain.   ALBUTEROL (VENTOLIN HFA) 108 (90 BASE) MCG/ACT INHALER    Inhale 2 puffs into the lungs every 4 (four) hours as needed for wheezing or shortness of breath (coughing fits).   CARVEDILOL (COREG) 3.125 MG TABLET    Take 1 tablet (3.125 mg total) by mouth 2 (two) times daily.   CETIRIZINE (ZYRTEC) 10  MG TABLET    Take 10 mg by mouth as needed for allergies.   DEXTROMETHORPHAN-GUAIFENESIN (MUCINEX DM PO)    Take by mouth as needed.   EPINEPHRINE (EPIPEN 2-PAK) 0.3 MG/0.3 ML IJ SOAJ INJECTION    Inject 0.3 mg into the muscle as needed. Inject 0.3mg  intramuscular route once as needed.   FLUTICASONE (FLONASE) 50 MCG/ACT NASAL SPRAY    SHAKE LIQUID AND USE 1 SPRAY IN EACH NOSTRIL TWICE DAILY AS NEEDED FOR NASAL CONGESTION   MELOXICAM (MOBIC) 7.5 MG TABLET    Take 1 tablet (7.5 mg total) by mouth daily. May increase to two daily if ineffective and notify provider    MOMETASONE (ELOCON) 0.1 % CREAM    Apply 1 Application topically daily as needed.   OLMESARTAN (BENICAR) 40 MG TABLET    TAKE 1 TABLET(40 MG) BY MOUTH DAILY   PREDNISONE (DELTASONE) 50 MG TABLET    Take one tablet by mouth once daily for 5 days.   SIMVASTATIN (ZOCOR) 20 MG TABLET    TAKE 1 TABLET(20 MG) BY MOUTH DAILY  Modified Medications   No medications on file  Discontinued Medications   No medications on file    Physical Exam:  Vitals:   07/03/22 1306  BP: (!) 200/120  Pulse: 98  Resp: 16  Temp: (!) 97.5 F (36.4 C)  SpO2: (!) 81%  Weight: 171 lb (77.6 kg)  Height: 5' 1.5" (1.562 m)   Body mass index is 31.79 kg/m. Wt Readings from Last 3 Encounters:  07/03/22 171 lb (77.6 kg)  04/23/22 173 lb 3.2 oz (78.6 kg)  03/10/22 179 lb (81.2 kg)    Physical Exam Vitals reviewed.  Constitutional:      General: She is not in acute distress. HENT:     Head: Normocephalic.  Eyes:     General:        Right eye: No discharge.        Left eye: No discharge.  Cardiovascular:     Rate and Rhythm: Normal rate and regular rhythm.     Pulses: Normal pulses.     Heart sounds: Normal heart sounds.  Pulmonary:     Effort: Pulmonary effort is normal. No respiratory distress.     Breath sounds: Wheezing present. No rhonchi or rales.     Comments: Expiratory wheezing to upper/ middle lobes, dry cough observed Abdominal:     General: Bowel sounds are normal.     Palpations: Abdomen is soft.  Musculoskeletal:     Cervical back: Neck supple.     Right lower leg: No edema.     Left lower leg: No edema.  Skin:    General: Skin is warm.     Capillary Refill: Capillary refill takes less than 2 seconds.  Neurological:     General: No focal deficit present.     Mental Status: She is alert and oriented to person, place, and time.  Psychiatric:        Mood and Affect: Mood normal.        Behavior: Behavior normal.     Labs reviewed: Basic Metabolic Panel: Recent Labs     08/26/21 1039  NA 139  K 5.1  CL 103  CO2 30  GLUCOSE 99  BUN 21  CREATININE 1.05*  CALCIUM 9.6  TSH 1.02   Liver Function Tests: Recent Labs    08/26/21 1039  AST 13  ALT 11  BILITOT 0.5  PROT 7.9   No results for  input(s): "LIPASE", "AMYLASE" in the last 8760 hours. No results for input(s): "AMMONIA" in the last 8760 hours. CBC: Recent Labs    08/26/21 1039 02/24/22 1546  WBC 5.2 6.2  NEUTROABS 3,198 3,460  HGB 13.8 13.4  HCT 41.7 39.6  MCV 91.0 91.2  PLT 229 254   Lipid Panel: Recent Labs    08/26/21 1039  CHOL 173  HDL 66  LDLCALC 92  TRIG 67  CHOLHDL 2.6   TSH: Recent Labs    08/26/21 1039  TSH 1.02   A1C: Lab Results  Component Value Date   HGBA1C 5.8 (H) 08/26/2021     Assessment/Plan 1. Wheezing - dry cough x 2 weeks - tried mucinex> unsuccessful - expiratory wheezing to upper and middle lobe, dry cough - initial O2 sats 81% RA> increased to 97% 2 liters> 93% on RA at end of encounter - h/o DOE, smoking at age 21-30's - concerns for asthma/ COPD exacerbation - start prednisone taper, Spiriva and Singulair - if breathing does not improve or symptoms worsen> advised to report to ED - predniSONE (DELTASONE) 20 MG tablet; Take 2 tablets (40 mg total) by mouth daily with breakfast for 5 days, THEN 1 tablet (20 mg total) daily with breakfast for 5 days.  Dispense: 15 tablet; Refill: 0 - Tiotropium Bromide Monohydrate (SPIRIVA RESPIMAT) 1.25 MCG/ACT AERS; Inhale 2 puffs into the lungs daily.  Dispense: 4 g; Refill: 5 - montelukast (SINGULAIR) 10 MG tablet; Take 1 tablet (10 mg total) by mouth at bedtime.  Dispense: 30 tablet; Refill: 3  2. Elevated blood pressure reading - initial BP 200/120> clonidine 0.1 mg given> improved to 148/94 - HR 80-90's - suspect related to breathing issues above - will increase morning dose to 6.125 mg  - cont olmesartan - advised to report to ED if any chest pain, headaches or blurred vision occur -  carvedilol (COREG) 3.125 MG tablet; Take 2 tablets (6.25 mg total) by mouth daily with breakfast AND 1 tablet (3.125 mg total) every evening.  Dispense: 180 tablet; Refill: 3  Total time: 70 minutes. Greater than 50% of total time spent doing patient education regarding wheezing and elevated blood pressure including symptom/medication management.   Next appt: Visit date not found  Reyce Lubeck Scherry Ran  [facility] [phone]

## 2022-07-03 NOTE — Patient Instructions (Addendum)
Please take spiriva inhaler once daily  Prescription sent into pharmacy for prednisone> will help with wheezing> start as soon as you get it, then every morning  Please take coreg ( 2 tablets in AM) AND 1 tablet in PM  Schedule follow up for Tuesday  If breathing worsens OR you have chest pain, blurred  please report to ED

## 2022-07-03 NOTE — Telephone Encounter (Signed)
Spoke with patient's daughter and she verbalized her understanding. 

## 2022-07-08 ENCOUNTER — Encounter: Payer: Self-pay | Admitting: Family

## 2022-07-08 ENCOUNTER — Ambulatory Visit (INDEPENDENT_AMBULATORY_CARE_PROVIDER_SITE_OTHER): Payer: Medicare Other | Admitting: Family

## 2022-07-08 VITALS — BP 180/86 | HR 67 | Temp 97.3°F | Resp 18 | Ht 60.15 in | Wt 172.0 lb

## 2022-07-08 DIAGNOSIS — R059 Cough, unspecified: Secondary | ICD-10-CM

## 2022-07-08 DIAGNOSIS — I10 Essential (primary) hypertension: Secondary | ICD-10-CM | POA: Diagnosis not present

## 2022-07-08 MED ORDER — BENZONATATE 100 MG PO CAPS: 100.0000 mg | ORAL_CAPSULE | Freq: Two times a day (BID) | ORAL | 0 refills | Status: DC | PRN

## 2022-07-08 NOTE — Progress Notes (Signed)
Provider: Richarda Blade FNP-C  Seniah Lawrence, Donalee Citrin, NP  Patient Care Team: Namrata Dangler, Donalee Citrin, NP as PCP - General (Family Medicine) Maisie Fus, MD as PCP - Cardiology (Cardiology) Aris Lot, MD as Consulting Physician (Dermatology)  Extended Emergency Contact Information Primary Emergency Contact: Terance Hart [place] of [place] Phone: [phone] Relation: Daughter Secondary Emergency Contact: booker,melonie Mobile Phone: [phone] Relation: Granddaughter  Code Status:  Full Code  Goals of care: Advanced Directive information    07/03/2022    1:08 PM  Advanced Directives  Does Patient Have a Medical Advance Directive? Yes  Type of Estate agent of [place];Living will;Out of facility DNR (pink MOST or yellow form)  Does patient want to make changes to medical advance directive? No - Patient declined  Copy of Healthcare Power of Attorney in Chart? No - copy requested     Chief Complaint  Patient presents with   Acute Visit    Patient is here for a 1 week F/U for cough and wheezing    HPI:  Pt is a 87 y.o. female seen today for an acute visit for one week follow up for cough and wheezing. She denies any fever,chills,fatigue,body aches,runny nose,chest tightness,chest pain,palpitation or shortness of breath.  Past Medical History:  Diagnosis Date   Eczema    Per [facility] new patient packet   Graves disease    Per [facility] new patient packet   High blood pressure    Per [facility] new patient packet   Hypertension    Past Surgical History:  Procedure Laterality Date   TOTAL ABDOMINAL HYSTERECTOMY Bilateral 1970's   TUBAL LIGATION      Allergies  Allergen Reactions   Codeine Nausea Only    unknown   Tramadol Nausea Only   Aspirin Rash    Outpatient Encounter Medications as of 07/08/2022  Medication Sig   acetaminophen (TYLENOL) 650 MG CR tablet Take 650 mg by mouth every 8 (eight) hours as needed for pain.   albuterol  (VENTOLIN HFA) 108 (90 Base) MCG/ACT inhaler Inhale 2 puffs into the lungs every 4 (four) hours as needed for wheezing or shortness of breath (coughing fits).   carvedilol (COREG) 3.125 MG tablet Take 2 tablets (6.25 mg total) by mouth daily with breakfast AND 1 tablet (3.125 mg total) every evening.   cetirizine (ZYRTEC) 10 MG tablet Take 10 mg by mouth as needed for allergies.   Dextromethorphan-guaiFENesin (MUCINEX DM PO) Take by mouth as needed.   EPINEPHrine (EPIPEN 2-PAK) 0.3 mg/0.3 mL IJ SOAJ injection Inject 0.3 mg into the muscle as needed. Inject 0.3mg  intramuscular route once as needed.   fluticasone (FLONASE) 50 MCG/ACT nasal spray SHAKE LIQUID AND USE 1 SPRAY IN EACH NOSTRIL TWICE DAILY AS NEEDED FOR NASAL CONGESTION   meloxicam (MOBIC) 7.5 MG tablet Take 1 tablet (7.5 mg total) by mouth daily. May increase to two daily if ineffective and notify provider   mometasone (ELOCON) 0.1 % cream Apply 1 Application topically daily as needed.   montelukast (SINGULAIR) 10 MG tablet Take 1 tablet (10 mg total) by mouth at bedtime.   olmesartan (BENICAR) 40 MG tablet TAKE 1 TABLET(40 MG) BY MOUTH DAILY   predniSONE (DELTASONE) 20 MG tablet Take 2 tablets (40 mg total) by mouth daily with breakfast for 5 days, THEN 1 tablet (20 mg total) daily with breakfast for 5 days.   predniSONE (DELTASONE) 50 MG tablet Take one tablet by mouth once daily for 5 days.   simvastatin (  ZOCOR) 20 MG tablet TAKE 1 TABLET(20 MG) BY MOUTH DAILY   Tiotropium Bromide Monohydrate (SPIRIVA RESPIMAT) 1.25 MCG/ACT AERS Inhale 2 puffs into the lungs daily.   Facility-Administered Encounter Medications as of 07/08/2022  Medication   cloNIDine (CATAPRES) tablet 0.1 mg    Review of Systems  Constitutional:  Negative for appetite change, chills, fatigue, fever and unexpected weight change.  HENT:  Negative for congestion, ear discharge, ear pain, hearing loss, nosebleeds, postnasal drip, rhinorrhea, sinus pressure, sinus pain,  sneezing, sore throat and tinnitus.   Eyes:  Negative for pain, discharge, redness, itching and visual disturbance.  Respiratory:  Positive for cough and wheezing. Negative for chest tightness and shortness of breath.   Cardiovascular:  Negative for chest pain, palpitations and leg swelling.  Neurological:  Negative for dizziness, weakness, light-headedness and headaches.    Immunization History  Administered Date(s) Administered   Fluad Quad(high Dose 65+) 10/11/2018   Influenza, High Dose Seasonal PF 12/03/2015, 11/07/2019   Influenza,inj,Quad PF,6+ Mos 10/11/2018   Influenza-Unspecified 11/16/2017, 11/27/2020   PFIZER Comirnaty(Gray Top)Covid-19 Tri-Sucrose Vaccine 07/31/2020   PFIZER(Purple Top)SARS-COV-2 Vaccination 02/18/2019, 03/11/2019, 11/29/2019, 07/31/2020   Pneumococcal Conjugate-13 10/11/2018   Td 10/11/2018   Tdap 10/11/2018   Pertinent  Health Maintenance Due  Topic Date Due   INFLUENZA VACCINE  09/10/2022   DEXA SCAN  Completed      08/15/2021    9:49 AM 08/22/2021    9:06 AM 08/26/2021    9:28 AM 02/24/2022    3:01 PM 07/03/2022    1:08 PM  Fall Risk  Falls in the past year? 0 0 0 0 0  Was there an injury with Fall? 0 0 0 0 0  Fall Risk Category Calculator 0 0 0 0 0  Fall Risk Category (Retired) Low Low Low    (RETIRED) Patient Fall Risk Level Low fall risk Low fall risk Low fall risk    Patient at Risk for Falls Due to No Fall Risks No Fall Risks No Fall Risks No Fall Risks No Fall Risks  Fall risk Follow up Falls evaluation completed Falls evaluation completed Falls evaluation completed Falls evaluation completed Falls evaluation completed   Functional Status Survey:    Vitals:   07/08/22 1350  BP: (!) 180/90  Pulse: 67  Resp: 18  Temp: (!) 97.3 F (36.3 C)  Weight: 172 lb (78 kg)  Height: 5' 0.15" (1.528 m)   Body mass index is 33.42 kg/m. Physical Exam Vitals reviewed.  Constitutional:      General: She is not in acute distress.    Appearance:  Normal appearance. She is obese. She is not ill-appearing or diaphoretic.  HENT:     Head: Normocephalic.     Right Ear: Tympanic membrane, ear canal and external ear normal. There is no impacted cerumen.     Left Ear: Tympanic membrane, ear canal and external ear normal. There is no impacted cerumen.     Nose: Nose normal. No congestion or rhinorrhea.     Mouth/Throat:     Mouth: Mucous membranes are moist.     Pharynx: Oropharynx is clear. No oropharyngeal exudate or posterior oropharyngeal erythema.  Eyes:     General: No scleral icterus.       Right eye: No discharge.        Left eye: No discharge.     Conjunctiva/sclera: Conjunctivae normal.     Pupils: Pupils are equal, round, and reactive to light.  Neck:     Vascular:  No carotid bruit.  Cardiovascular:     Rate and Rhythm: Normal rate and regular rhythm.     Pulses: Normal pulses.     Heart sounds: Normal heart sounds. No murmur heard.    No friction rub. No gallop.  Pulmonary:     Effort: Pulmonary effort is normal. No respiratory distress.     Breath sounds: Normal breath sounds. No wheezing, rhonchi or rales.  Chest:     Chest wall: No tenderness.  Abdominal:     General: Bowel sounds are normal. There is no distension.     Palpations: Abdomen is soft. There is no mass.     Tenderness: There is no abdominal tenderness. There is no right CVA tenderness, left CVA tenderness, guarding or rebound.  Musculoskeletal:        General: No swelling or tenderness. Normal range of motion.     Cervical back: Normal range of motion. No rigidity or tenderness.     Right lower leg: No edema.     Left lower leg: No edema.  Lymphadenopathy:     Cervical: No cervical adenopathy.  Skin:    General: Skin is warm and dry.     Coloration: Skin is not pale.     Findings: No erythema or rash.  Neurological:     Mental Status: She is alert and oriented to person, place, and time.     Sensory: No sensory deficit.     Motor: No weakness.      Gait: Gait normal.  Psychiatric:        Mood and Affect: Mood normal.        Speech: Speech normal.        Behavior: Behavior normal.    Labs reviewed: Recent Labs    08/26/21 1039  NA 139  K 5.1  CL 103  CO2 30  GLUCOSE 99  BUN 21  CREATININE 1.05*  CALCIUM 9.6   Recent Labs    08/26/21 1039  AST 13  ALT 11  BILITOT 0.5  PROT 7.9   Recent Labs    08/26/21 1039 02/24/22 1546  WBC 5.2 6.2  NEUTROABS 3,198 3,460  HGB 13.8 13.4  HCT 41.7 39.6  MCV 91.0 91.2  PLT 229 254   Lab Results  Component Value Date   TSH 1.02 08/26/2021   Lab Results  Component Value Date   HGBA1C 5.8 (H) 08/26/2021   Lab Results  Component Value Date   CHOL 173 08/26/2021   HDL 66 08/26/2021   LDLCALC 92 08/26/2021   TRIG 67 08/26/2021   CHOLHDL 2.6 08/26/2021    Significant Diagnostic Results in last 30 days:  No results found.  Assessment/Plan 1. Essential hypertension B/p well elevated.Has not take her medication  Clonidine administered - continue on olmesartan and carvedilol  2. Cough, unspecified type Afebrile  - bilateral lung clear  - will order tessalon for cough  - hx of smoking suspect possible COPD will refer to pulmonologist for PFT's. - benzonatate (TESSALON) 100 MG capsule; Take 1 capsule (100 mg total) by mouth 2 (two) times daily as needed for cough.  Dispense: 20 capsule; Refill: 0 - Ambulatory referral to Pulmonology  Family/ staff Communication: Reviewed plan of care with patient verbalized understanding   Labs/tests ordered: None   Next Appointment: Return in about 2 months (around 09/07/2022) for medical mangement of chronic issues.Caesar Bookman, NP

## 2022-08-17 ENCOUNTER — Ambulatory Visit (INDEPENDENT_AMBULATORY_CARE_PROVIDER_SITE_OTHER): Payer: Medicare Other | Admitting: Family

## 2022-08-17 ENCOUNTER — Encounter: Payer: Self-pay | Admitting: Family

## 2022-08-17 VITALS — BP 160/90 | HR 82 | Temp 97.1°F | Ht 60.0 in | Wt 172.0 lb

## 2022-08-17 DIAGNOSIS — I4891 Unspecified atrial fibrillation: Secondary | ICD-10-CM | POA: Diagnosis not present

## 2022-08-17 DIAGNOSIS — R7303 Prediabetes: Secondary | ICD-10-CM | POA: Diagnosis not present

## 2022-08-17 DIAGNOSIS — I10 Essential (primary) hypertension: Secondary | ICD-10-CM

## 2022-08-17 DIAGNOSIS — E785 Hyperlipidemia, unspecified: Secondary | ICD-10-CM

## 2022-08-17 DIAGNOSIS — H6121 Impacted cerumen, right ear: Secondary | ICD-10-CM | POA: Diagnosis not present

## 2022-08-17 MED ORDER — DEBROX 6.5 % OT SOLN: 5.0000 [drp] | Freq: Two times a day (BID) | OTIC | 0 refills | Status: DC

## 2022-08-17 MED ORDER — CLONIDINE HCL 0.1 MG PO TABS
0.1000 mg | ORAL_TABLET | Freq: Once | ORAL | Status: AC
  Administered 2022-08-17: 0.1 mg via ORAL

## 2022-08-17 MED ORDER — MELOXICAM 7.5 MG PO TABS: 7.5000 mg | ORAL_TABLET | Freq: Every day | ORAL | 1 refills | Status: AC | PRN

## 2022-08-17 MED ORDER — ALBUTEROL SULFATE HFA 108 (90 BASE) MCG/ACT IN AERS: 2.0000 | INHALATION_SPRAY | RESPIRATORY_TRACT | 1 refills | Status: DC | PRN

## 2022-08-17 NOTE — Patient Instructions (Addendum)
1.) Schedule an annual wellness visit at the check out  2.) Visit your local pharmacy to receive your shingles and covid vaccines  3.) Call your insurance company to see if they have paid for the pneumonia vaccine since the age of 36, if not we can update at your next visit or you can receive at your local pharmacy.   4.)  Instill debrox 6.5 otic solution 5 drops into each ear twice daily x 4 days then follow up for ear lavage.May apply cotton ball at bedtime to prevent drainage to pillow.  5.) check Blood pressure at home and record on log provided and notify provider if B/p > 140/90

## 2022-08-17 NOTE — Progress Notes (Signed)
Provider: Richarda Blade FNP-C   Demaria Deeney, Donalee Citrin, NP  Patient Care Team: Darragh Nay, Donalee Citrin, NP as PCP - General (Family Medicine) Maisie Fus, MD as PCP - Cardiology (Cardiology) Aris Lot, MD as Consulting Physician (Dermatology)  Extended Emergency Contact Information Primary Emergency Contact: Terance Hart [place] of [place] Phone: [phone] Relation: Daughter Secondary Emergency Contact: booker,melonie Mobile Phone: [phone] Relation: Granddaughter  Code Status:  Full Code  Goals of care: Advanced Directive information    08/17/2022    8:47 AM  Advanced Directives  Does Patient Have a Medical Advance Directive? Yes  Type of Estate agent of [place];Living will;Out of facility DNR (pink MOST or yellow form)  Does patient want to make changes to medical advance directive? No - Patient declined  Copy of Healthcare Power of Attorney in Chart? No - copy requested     Chief Complaint  Patient presents with   Medical Management of Chronic Issues    2 month follow-up. Discuss need for shingrix, pneumonia vaccine, AWV, and covid boosters.. Discuss Meloxicam, patient questions if she should be on this medication. Patient has not taken B/P medication today due to not eating. Patient usually takes b/p medication with breakfast. Here with daughter, Cordelia Pen .    HPI:  Pt is a 87 y.o. female seen today for 2 months follow up for medical management of chronic diseases. Here with her daughter. B/p elevated but has not taken her B/p medication.usually takes after eating breakfast but did not eat this morning prior to visit. Patient's daughter states will need to purchase an other pill box for patient did well remembering to take medication when she had pill box.seems to forget medication now days and sometimes gets mixed up and takes medication twice. States does exercise by doing house work and going up the steps. Still gets shortness  of breath after using steps three times.albuterol has been effective with wheezing.  Due for shingles and PNA vaccine.made aware to get shingles vaccine at the pharmacy.   Past Medical History:  Diagnosis Date   Eczema    Per [facility] new patient packet   Graves disease    Per [facility] new patient packet   High blood pressure    Per [facility] new patient packet   Hypertension    Past Surgical History:  Procedure Laterality Date   TOTAL ABDOMINAL HYSTERECTOMY Bilateral 1970's   TUBAL LIGATION      Allergies  Allergen Reactions   Codeine Nausea Only    unknown   Tramadol Nausea Only   Aspirin Rash    Allergies as of 08/17/2022       Reactions   Codeine Nausea Only   unknown   Tramadol Nausea Only   Aspirin Rash        Medication List        Accurate as of August 17, 2022  9:02 AM. If you have any questions, ask your nurse or doctor.          STOP taking these medications    predniSONE 50 MG tablet Commonly known as: DELTASONE Stopped by: Caesar Bookman, NP       TAKE these medications    acetaminophen 650 MG CR tablet Commonly known as: TYLENOL Take 650 mg by mouth every 8 (eight) hours as needed for pain.   albuterol 108 (90 Base) MCG/ACT inhaler Commonly known as: Ventolin HFA Inhale 2 puffs into the lungs every 4 (four) hours as needed for wheezing or  shortness of breath (coughing fits).   benzonatate 100 MG capsule Commonly known as: TESSALON Take 1 capsule (100 mg total) by mouth 2 (two) times daily as needed for cough.   carvedilol 3.125 MG tablet Commonly known as: COREG Take 2 tablets (6.25 mg total) by mouth daily with breakfast AND 1 tablet (3.125 mg total) every evening.   cetirizine 10 MG tablet Commonly known as: ZYRTEC Take 10 mg by mouth as needed for allergies.   EPINEPHrine 0.3 mg/0.3 mL Soaj injection Commonly known as: EpiPen 2-Pak Inject 0.3 mg into the muscle as needed. Inject 0.3mg  intramuscular route once as needed.   fluticasone 50  MCG/ACT nasal spray Commonly known as: FLONASE SHAKE LIQUID AND USE 1 SPRAY IN EACH NOSTRIL TWICE DAILY AS NEEDED FOR NASAL CONGESTION   meloxicam 7.5 MG tablet Commonly known as: MOBIC Take 1 tablet (7.5 mg total) by mouth daily. May increase to two daily if ineffective and notify provider   mometasone 0.1 % cream Commonly known as: ELOCON Apply 1 Application topically daily as needed.   montelukast 10 MG tablet Commonly known as: SINGULAIR Take 1 tablet (10 mg total) by mouth at bedtime.   MUCINEX DM PO Take 2 capsules by mouth as needed.   olmesartan 40 MG tablet Commonly known as: BENICAR TAKE 1 TABLET(40 MG) BY MOUTH DAILY   simvastatin 20 MG tablet Commonly known as: ZOCOR TAKE 1 TABLET(20 MG) BY MOUTH DAILY   Spiriva Respimat 1.25 MCG/ACT Aers Generic drug: Tiotropium Bromide Monohydrate Inhale 2 puffs into the lungs daily.        Review of Systems  Constitutional:  Negative for appetite change, chills, fatigue, fever and unexpected weight change.  HENT:  Negative for congestion, dental problem, ear discharge, ear pain, facial swelling, hearing loss, nosebleeds, postnasal drip, rhinorrhea, sinus pressure, sinus pain, sneezing, sore throat, tinnitus and trouble swallowing.   Eyes:  Negative for pain, discharge, redness, itching and visual disturbance.  Respiratory:  Negative for cough, chest tightness, shortness of breath and wheezing.   Cardiovascular:  Negative for chest pain, palpitations and leg swelling.  Gastrointestinal:  Negative for abdominal distention, abdominal pain, blood in stool, constipation, diarrhea, nausea and vomiting.  Endocrine: Negative for cold intolerance, heat intolerance, polydipsia, polyphagia and polyuria.  Genitourinary:  Negative for difficulty urinating, dysuria, flank pain, frequency and urgency.  Musculoskeletal:  Negative for arthralgias, back pain, gait problem, joint swelling, myalgias, neck pain and neck stiffness.  Skin:   Negative for color change, pallor, rash and wound.  Neurological:  Negative for dizziness, syncope, speech difficulty, weakness, light-headedness, numbness and headaches.  Hematological:  Does not bruise/bleed easily.  Psychiatric/Behavioral:  Negative for agitation, behavioral problems, confusion, hallucinations, self-injury, sleep disturbance and suicidal ideas. The patient is not nervous/anxious.     Immunization History  Administered Date(s) Administered   Fluad Quad(high Dose 65+) 10/11/2018   Influenza, High Dose Seasonal PF 12/03/2015, 11/07/2019   Influenza,inj,Quad PF,6+ Mos 10/11/2018   Influenza-Unspecified 11/16/2017, 11/27/2020   PFIZER Comirnaty(Gray Top)Covid-19 Tri-Sucrose Vaccine 07/31/2020   PFIZER(Purple Top)SARS-COV-2 Vaccination 02/18/2019, 03/11/2019, 11/29/2019, 07/31/2020   Pneumococcal Conjugate-13 10/11/2018   Td 10/11/2018   Tdap 10/11/2018   Pertinent  Health Maintenance Due  Topic Date Due   INFLUENZA VACCINE  09/10/2022   DEXA SCAN  Completed      08/22/2021    9:06 AM 08/26/2021    9:28 AM 02/24/2022    3:01 PM 07/03/2022    1:08 PM 08/17/2022    8:46 AM  Fall  Risk  Falls in the past year? 0 0 0 0 0  Was there an injury with Fall? 0 0 0 0 0  Fall Risk Category Calculator 0 0 0 0 0  Fall Risk Category (Retired) Low Low     (RETIRED) Patient Fall Risk Level Low fall risk Low fall risk     Patient at Risk for Falls Due to No Fall Risks No Fall Risks No Fall Risks No Fall Risks No Fall Risks  Fall risk Follow up Falls evaluation completed Falls evaluation completed Falls evaluation completed Falls evaluation completed Falls evaluation completed   Functional Status Survey:    Vitals:   08/17/22 0842 08/17/22 0856  BP: (!) 162/102 (!) 164/100  Pulse: 82   Temp: (!) 97.1 F (36.2 C)   TempSrc: Temporal   SpO2: 95%   Weight: 172 lb (78 kg)   Height: 5' (1.524 m)    Body mass index is 33.59 kg/m. Physical Exam Vitals reviewed.  Constitutional:       General: She is not in acute distress.    Appearance: Normal appearance. She is obese. She is not ill-appearing or diaphoretic.  HENT:     Head: Normocephalic.     Right Ear: There is impacted cerumen.     Left Ear: Tympanic membrane, ear canal and external ear normal. There is no impacted cerumen.     Nose: Nose normal. No congestion or rhinorrhea.     Mouth/Throat:     Mouth: Mucous membranes are moist.     Pharynx: Oropharynx is clear. No oropharyngeal exudate or posterior oropharyngeal erythema.  Eyes:     General: No scleral icterus.       Right eye: No discharge.        Left eye: No discharge.     Extraocular Movements: Extraocular movements intact.     Conjunctiva/sclera: Conjunctivae normal.     Pupils: Pupils are equal, round, and reactive to light.  Neck:     Vascular: No carotid bruit.  Cardiovascular:     Rate and Rhythm: Normal rate and regular rhythm.     Pulses: Normal pulses.     Heart sounds: Normal heart sounds. No murmur heard.    No friction rub. No gallop.  Pulmonary:     Effort: Pulmonary effort is normal. No respiratory distress.     Breath sounds: Normal breath sounds. No wheezing, rhonchi or rales.  Chest:     Chest wall: No tenderness.  Abdominal:     General: Bowel sounds are normal. There is no distension.     Palpations: Abdomen is soft. There is no mass.     Tenderness: There is no abdominal tenderness. There is no right CVA tenderness, left CVA tenderness, guarding or rebound.  Musculoskeletal:        General: No swelling or tenderness. Normal range of motion.     Cervical back: Normal range of motion. No rigidity or tenderness.     Right lower leg: No edema.     Left lower leg: No edema.  Lymphadenopathy:     Cervical: No cervical adenopathy.  Skin:    General: Skin is warm and dry.     Coloration: Skin is not pale.     Findings: No bruising, erythema, lesion or rash.  Neurological:     Mental Status: She is alert and oriented to  person, place, and time.     Cranial Nerves: No cranial nerve deficit.     Sensory: No sensory  deficit.     Motor: No weakness.     Coordination: Coordination normal.     Gait: Gait normal.  Psychiatric:        Mood and Affect: Mood normal.        Speech: Speech normal.        Behavior: Behavior normal.        Thought Content: Thought content normal.        Judgment: Judgment normal.    Labs reviewed: Recent Labs    08/26/21 1039  NA 139  K 5.1  CL 103  CO2 30  GLUCOSE 99  BUN 21  CREATININE 1.05*  CALCIUM 9.6   Recent Labs    08/26/21 1039  AST 13  ALT 11  BILITOT 0.5  PROT 7.9   Recent Labs    08/26/21 1039 02/24/22 1546  WBC 5.2 6.2  NEUTROABS 3,198 3,460  HGB 13.8 13.4  HCT 41.7 39.6  MCV 91.0 91.2  PLT 229 254   Lab Results  Component Value Date   TSH 1.02 08/26/2021   Lab Results  Component Value Date   HGBA1C 5.8 (H) 08/26/2021   Lab Results  Component Value Date   CHOL 173 08/26/2021   HDL 66 08/26/2021   LDLCALC 92 08/26/2021   TRIG 67 08/26/2021   CHOLHDL 2.6 08/26/2021    Significant Diagnostic Results in last 30 days:  No results found.  Assessment/Plan  1. Essential hypertension B/p elevated this visit though did not take her morning B/p medication.seems to be forgetting to take medication or takes twice sometimes per daughter.she will buy pill box for pt which has worked well in the past.  - advised to check B/p at home and notify provider if B/p > 140/90 will consider adding Amlodipine 5 mg tablet if still elevated on next visit in 1 week.  - continue on Olmesartan and Coreg  - TSH - COMPLETE METABOLIC PANEL WITH GFR - CBC with Differential/Platelet  2. Hyperlipidemia LDL goal <100 LDL at goal  - continue on simvastatin  - dietary modification and exercise advised  - Lipid panel  3. Prediabetes Lab Results  Component Value Date   HGBA1C 5.8 (H) 08/26/2021  Dietary modification and exercise advised  - Hemoglobin  A1c  4. Right ear impacted cerumen Right TM not visualized due to cerumen impaction - Advised to Instill debrox 6.5 otic solution 5 drops into each ear twice daily x 4 days then follow up for ear lavage.May apply cotton ball at bedtime to prevent drainage to pillow. - carbamide peroxide (DEBROX) 6.5 % OTIC solution; Place 5 drops into the right ear 2 (two) times daily.  Dispense: 15 mL; Refill: 0  5. Atrial fibrillation HR controlled  - continue on Coreg for HR control - Not on anticoagulant or ASA due to advance age  - continue to follow up with cardiology as directed.    Family/ staff Communication: Reviewed plan of care with patient verbalized understanding.   Labs/tests ordered:  - TSH - COMPLETE METABOLIC PANEL WITH GFR - CBC with Differential/Platelet - Hemoglobin A1c - Lipid panel  Next Appointment : Return in about 6 months (around 02/17/2023) for medical mangement of chronic issues. 1 week for right ear lavage and Blood pressure .   Caesar Bookman, NP

## 2022-08-18 LAB — CBC WITH DIFFERENTIAL/PLATELET
Absolute Monocytes: 432 cells/uL (ref 200–950)
Basophils Absolute: 22 cells/uL (ref 0–200)
Basophils Relative: 0.4 %
Eosinophils Absolute: 200 cells/uL (ref 15–500)
Eosinophils Relative: 3.7 %
HCT: 41.1 % (ref 35.0–45.0)
Hemoglobin: 13.5 g/dL (ref 11.7–15.5)
Lymphs Abs: 1129 cells/uL (ref 850–3900)
MCH: 30.1 pg (ref 27.0–33.0)
MCHC: 32.8 g/dL (ref 32.0–36.0)
MCV: 91.5 fL (ref 80.0–100.0)
MPV: 9.8 fL (ref 7.5–12.5)
Monocytes Relative: 8 %
Neutro Abs: 3618 cells/uL (ref 1500–7800)
Neutrophils Relative %: 67 %
Platelets: 232 10*3/uL (ref 140–400)
RBC: 4.49 10*6/uL (ref 3.80–5.10)
RDW: 12.6 % (ref 11.0–15.0)
Total Lymphocyte: 20.9 %
WBC: 5.4 10*3/uL (ref 3.8–10.8)

## 2022-08-18 LAB — COMPLETE METABOLIC PANEL WITH GFR
AG Ratio: 1.1 (calc) (ref 1.0–2.5)
ALT: 8 U/L (ref 6–29)
AST: 13 U/L (ref 10–35)
Albumin: 3.8 g/dL (ref 3.6–5.1)
Alkaline phosphatase (APISO): 95 U/L (ref 37–153)
BUN: 17 mg/dL (ref 7–25)
CO2: 27 mmol/L (ref 20–32)
Calcium: 9.2 mg/dL (ref 8.6–10.4)
Chloride: 105 mmol/L (ref 98–110)
Creat: 0.88 mg/dL (ref 0.60–0.95)
Globulin: 3.5 g/dL (calc) (ref 1.9–3.7)
Glucose, Bld: 98 mg/dL (ref 65–99)
Potassium: 4.3 mmol/L (ref 3.5–5.3)
Sodium: 140 mmol/L (ref 135–146)
Total Bilirubin: 0.4 mg/dL (ref 0.2–1.2)
Total Protein: 7.3 g/dL (ref 6.1–8.1)
eGFR: 64 mL/min/{1.73_m2} (ref >=60)

## 2022-08-18 LAB — LIPID PANEL
Cholesterol: 207 mg/dL — ABNORMAL HIGH (ref <200)
HDL: 57 mg/dL (ref >=50)
LDL Cholesterol (Calc): 131 mg/dL (calc) — ABNORMAL HIGH
Non-HDL Cholesterol (Calc): 150 mg/dL (calc) — ABNORMAL HIGH (ref <130)
Total CHOL/HDL Ratio: 3.6 (calc) (ref <5.0)
Triglycerides: 91 mg/dL (ref <150)

## 2022-08-18 LAB — TSH: TSH: 0.95 mIU/L (ref 0.40–4.50)

## 2022-08-18 LAB — HEMOGLOBIN A1C
Hgb A1c MFr Bld: 6.2 % of total Hgb — ABNORMAL HIGH (ref <5.7)
Mean Plasma Glucose: 131 mg/dL
eAG (mmol/L): 7.3 mmol/L

## 2022-08-20 ENCOUNTER — Telehealth: Payer: Self-pay

## 2022-08-20 NOTE — Telephone Encounter (Signed)
Blood pressure is within normal range.continue current medication.

## 2022-08-20 NOTE — Telephone Encounter (Signed)
Patient's daughter called stating that patient's blood pressure was 132/76. She states that she was told to let provider know what blood pressure reading is.  Message sent to Richarda Blade, NP

## 2022-08-24 ENCOUNTER — Encounter: Payer: Self-pay | Admitting: Family

## 2022-08-24 ENCOUNTER — Ambulatory Visit: Payer: Medicare Other | Admitting: Family

## 2022-08-24 VITALS — BP 140/80 | HR 88 | Temp 97.3°F | Resp 18 | Ht 60.0 in | Wt 170.0 lb

## 2022-08-24 DIAGNOSIS — I10 Essential (primary) hypertension: Secondary | ICD-10-CM | POA: Diagnosis not present

## 2022-08-24 DIAGNOSIS — H6121 Impacted cerumen, right ear: Secondary | ICD-10-CM | POA: Diagnosis not present

## 2022-08-24 DIAGNOSIS — R059 Cough, unspecified: Secondary | ICD-10-CM

## 2022-08-24 DIAGNOSIS — L308 Other specified dermatitis: Secondary | ICD-10-CM | POA: Diagnosis not present

## 2022-08-24 MED ORDER — MOMETASONE FUROATE 0.1 % EX CREA: 1.0000 | TOPICAL_CREAM | Freq: Every day | CUTANEOUS | 3 refills | Status: DC | PRN

## 2022-08-24 MED ORDER — BENZONATATE 100 MG PO CAPS: 100.0000 mg | ORAL_CAPSULE | Freq: Two times a day (BID) | ORAL | 0 refills | Status: DC | PRN

## 2022-08-24 NOTE — Progress Notes (Signed)
Provider: Richarda Blade FNP-C  Suprena Travaglini, Donalee Citrin, NP  Patient Care Team: Moniqua Engebretsen, Donalee Citrin, NP as PCP - General (Family Medicine) Maisie Fus, MD as PCP - Cardiology (Cardiology) Aris Lot, MD as Consulting Physician (Dermatology)  Extended Emergency Contact Information Primary Emergency Contact: Terance Hart [place] Phone: [phone] Relation: Daughter Secondary Emergency Contact: booker,melonie [place] Phone: [phone] Relation: Granddaughter  Code Status:  Full Code  Goals of care: Advanced Directive information    08/17/2022    8:47 AM  Advanced Directives  Does Patient Have a Medical Advance Directive? Yes  Type of Estate agent of [place];Living will;Out of facility DNR (pink MOST or yellow form)  Does patient want to make changes to medical advance directive? No - Patient declined  Copy of Healthcare Power of Attorney in Chart? No - copy requested     Chief Complaint  Patient presents with   Acute Visit    Patient is here for ear lavage and blood pressure check    HPI:  Pt is a 87 y.o. female seen today for an acute visit for evaluation of right ear lavage.she was here 08/17/2022 right cerumen impaction was noted. She was advised to instil debrox 6.5 % otic solution 5 drops into each ear twice daily x 4 days then follow up today for ear lavage.she is here with her daughter.states has used debrox as directed. She denies any fever,chills,ear pain or drainage.she does wears bilateral hearing aids.   Past Medical History:  Diagnosis Date   Eczema    Per [facility] new patient packet   Graves disease    Per [facility] new patient packet   High blood pressure    Per [facility] new patient packet   Hypertension    Past Surgical History:  Procedure Laterality Date   TOTAL ABDOMINAL HYSTERECTOMY Bilateral 1970's   TUBAL LIGATION      Allergies  Allergen Reactions   Codeine Nausea Only    unknown   Tramadol Nausea Only    Aspirin Rash    Outpatient Encounter Medications as of 08/24/2022  Medication Sig   acetaminophen (TYLENOL) 650 MG CR tablet Take 650 mg by mouth every 8 (eight) hours as needed for pain.   albuterol (VENTOLIN HFA) 108 (90 Base) MCG/ACT inhaler Inhale 2 puffs into the lungs every 4 (four) hours as needed for wheezing or shortness of breath (coughing fits).   carbamide peroxide (DEBROX) 6.5 % OTIC solution Place 5 drops into the right ear 2 (two) times daily.   carvedilol (COREG) 3.125 MG tablet Take 2 tablets (6.25 mg total) by mouth daily with breakfast AND 1 tablet (3.125 mg total) every evening.   cetirizine (ZYRTEC) 10 MG tablet Take 10 mg by mouth as needed for allergies.   Dextromethorphan-guaiFENesin (MUCINEX DM PO) Take 2 capsules by mouth as needed.   EPINEPHrine (EPIPEN 2-PAK) 0.3 mg/0.3 mL IJ SOAJ injection Inject 0.3 mg into the muscle as needed. Inject 0.3mg  intramuscular route once as needed.   fluticasone (FLONASE) 50 MCG/ACT nasal spray SHAKE LIQUID AND USE 1 SPRAY IN EACH NOSTRIL TWICE DAILY AS NEEDED FOR NASAL CONGESTION   meloxicam (MOBIC) 7.5 MG tablet Take 1 tablet (7.5 mg total) by mouth daily as needed for pain. May increase to two daily if ineffective and notify provider   montelukast (SINGULAIR) 10 MG tablet Take 1 tablet (10 mg total) by mouth at bedtime.   olmesartan (BENICAR) 40 MG tablet TAKE 1 TABLET(40 MG) BY MOUTH DAILY  simvastatin (ZOCOR) 20 MG tablet TAKE 1 TABLET(20 MG) BY MOUTH DAILY   Tiotropium Bromide Monohydrate (SPIRIVA RESPIMAT) 1.25 MCG/ACT AERS Inhale 2 puffs into the lungs daily.   [DISCONTINUED] benzonatate (TESSALON) 100 MG capsule Take 1 capsule (100 mg total) by mouth 2 (two) times daily as needed for cough.   [DISCONTINUED] mometasone (ELOCON) 0.1 % cream Apply 1 Application topically daily as needed.   benzonatate (TESSALON) 100 MG capsule Take 1 capsule (100 mg total) by mouth 2 (two) times daily as needed for cough.   mometasone (ELOCON)  0.1 % cream Apply 1 Application topically daily as needed.   Facility-Administered Encounter Medications as of 08/24/2022  Medication   cloNIDine (CATAPRES) tablet 0.1 mg    Review of Systems  Constitutional:  Negative for appetite change, chills, fatigue, fever and unexpected weight change.  HENT:  Positive for hearing loss. Negative for congestion, ear discharge, ear pain, facial swelling, nosebleeds, postnasal drip, rhinorrhea, sinus pressure, sinus pain, sneezing, sore throat, tinnitus and trouble swallowing.        Bilateral hearing aids  Eyes:  Negative for pain, discharge, redness, itching and visual disturbance.  Skin:  Negative for color change, pallor and rash.  Neurological:  Negative for dizziness, weakness, light-headedness and headaches.    Immunization History  Administered Date(s) Administered   Fluad Quad(high Dose 65+) 10/11/2018   Influenza, High Dose Seasonal PF 12/03/2015, 11/07/2019   Influenza,inj,Quad PF,6+ Mos 10/11/2018   Influenza-Unspecified 11/16/2017, 11/27/2020   PFIZER Comirnaty(Gray Top)Covid-19 Tri-Sucrose Vaccine 07/31/2020   PFIZER(Purple Top)SARS-COV-2 Vaccination 02/18/2019, 03/11/2019, 11/29/2019, 07/31/2020   Pneumococcal Conjugate-13 10/11/2018   Td 10/11/2018   Tdap 10/11/2018   Pertinent  Health Maintenance Due  Topic Date Due   INFLUENZA VACCINE  09/10/2022   DEXA SCAN  Completed      08/22/2021    9:06 AM 08/26/2021    9:28 AM 02/24/2022    3:01 PM 07/03/2022    1:08 PM 08/17/2022    8:46 AM  Fall Risk  Falls in the past year? 0 0 0 0 0  Was there an injury with Fall? 0 0 0 0 0  Fall Risk Category Calculator 0 0 0 0 0  Fall Risk Category (Retired) Low Low     (RETIRED) Patient Fall Risk Level Low fall risk Low fall risk     Patient at Risk for Falls Due to No Fall Risks No Fall Risks No Fall Risks No Fall Risks No Fall Risks  Fall risk Follow up Falls evaluation completed Falls evaluation completed Falls evaluation completed Falls  evaluation completed Falls evaluation completed   Functional Status Survey:    Vitals:   08/24/22 1109 08/24/22 1110  BP: (!) 140/78 (!) 140/80  Pulse: 88   Resp: 18   Temp: (!) 97.3 F (36.3 C)   SpO2: 94%   Weight: 170 lb (77.1 kg)   Height: 5' (1.524 m)    Body mass index is 33.2 kg/m. Physical Exam Vitals reviewed.  Constitutional:      General: She is not in acute distress.    Appearance: Normal appearance. She is normal weight. She is not ill-appearing or diaphoretic.  HENT:     Head: Normocephalic.     Right Ear: There is impacted cerumen.     Left Ear: Tympanic membrane, ear canal and external ear normal. There is no impacted cerumen.     Ears:     Comments: Right ear cerumen lavaged with warm water and hydrogen peroxide moderate amounts  of cerumen obtained.No instrument used.Tolerated procedure well.TM clear without any signs of infection.     Nose: Nose normal. No congestion or rhinorrhea.     Mouth/Throat:     Mouth: Mucous membranes are moist.     Pharynx: Oropharynx is clear. No oropharyngeal exudate or posterior oropharyngeal erythema.  Eyes:     General: No scleral icterus.       Right eye: No discharge.        Left eye: No discharge.     Conjunctiva/sclera: Conjunctivae normal.     Pupils: Pupils are equal, round, and reactive to light.  Neck:     Vascular: No carotid bruit.  Cardiovascular:     Rate and Rhythm: Normal rate and regular rhythm.     Pulses: Normal pulses.     Heart sounds: Normal heart sounds. No murmur heard.    No friction rub. No gallop.  Pulmonary:     Effort: Pulmonary effort is normal. No respiratory distress.     Breath sounds: Normal breath sounds. No wheezing, rhonchi or rales.  Chest:     Chest wall: No tenderness.  Abdominal:     General: Bowel sounds are normal.     Palpations: Abdomen is soft. There is no mass.     Tenderness: There is no abdominal tenderness. There is no right CVA tenderness, left CVA tenderness,  guarding or rebound.  Musculoskeletal:        General: No swelling or tenderness. Normal range of motion.     Cervical back: Normal range of motion. No rigidity or tenderness.     Right lower leg: No edema.     Left lower leg: No edema.  Lymphadenopathy:     Cervical: No cervical adenopathy.  Skin:    General: Skin is warm and dry.     Coloration: Skin is not pale.     Findings: No bruising, erythema, lesion or rash.  Neurological:     Mental Status: She is alert and oriented to person, place, and time.     Gait: Gait normal.  Psychiatric:        Mood and Affect: Mood normal.        Speech: Speech normal.        Behavior: Behavior normal.     Labs reviewed: Recent Labs    08/26/21 1039 08/17/22 0944  NA 139 140  K 5.1 4.3  CL 103 105  CO2 30 27  GLUCOSE 99 98  BUN 21 17  CREATININE 1.05* 0.88  CALCIUM 9.6 9.2   Recent Labs    08/26/21 1039 08/17/22 0944  AST 13 13  ALT 11 8  BILITOT 0.5 0.4  PROT 7.9 7.3   Recent Labs    08/26/21 1039 02/24/22 1546 08/17/22 0944  WBC 5.2 6.2 5.4  NEUTROABS 3,198 3,460 3,618  HGB 13.8 13.4 13.5  HCT 41.7 39.6 41.1  MCV 91.0 91.2 91.5  PLT 229 254 232   Lab Results  Component Value Date   TSH 0.95 08/17/2022   Lab Results  Component Value Date   HGBA1C 6.2 (H) 08/17/2022   Lab Results  Component Value Date   CHOL 207 (H) 08/17/2022   HDL 57 08/17/2022   LDLCALC 131 (H) 08/17/2022   TRIG 91 08/17/2022   CHOLHDL 3.6 08/17/2022    Significant Diagnostic Results in last 30 days:  No results found.  Assessment/Plan 1. Essential hypertension B/p has improved  - continue on Olmesartan and Coreg - continue to  monitor B/p if > 140/90   2. Right ear impacted cerumen Right ear cerumen lavaged with warm water and hydrogen peroxide moderate amounts of cerumen obtained.No instrument used.Tolerated procedure well.TM clear without any signs of infection.  3. Cough, unspecified type Cough has slightly  improved.request refill benzonatate - benzonatate (TESSALON) 100 MG capsule; Take 1 capsule (100 mg total) by mouth 2 (two) times daily as needed for cough.  Dispense: 20 capsule; Refill: 0  4. Other eczema Continue on mometasone  - mometasone (ELOCON) 0.1 % cream; Apply 1 Application topically daily as needed.  Dispense: 45 g; Refill: 3  Family/ staff Communication: Reviewed plan of care with patient verbalized understanding.   Labs/tests ordered: None   Next Appointment: Return if symptoms worsen or fail to improve.   Caesar Bookman, NP

## 2022-08-24 NOTE — Patient Instructions (Signed)
Please get shingles and Pneumonia vaccine at the pharmacy

## 2022-10-16 ENCOUNTER — Emergency Department (HOSPITAL_COMMUNITY): Payer: Medicare Other

## 2022-10-16 ENCOUNTER — Encounter (HOSPITAL_COMMUNITY): Payer: Self-pay | Admitting: Family Medicine

## 2022-10-16 ENCOUNTER — Observation Stay (HOSPITAL_COMMUNITY)
Admission: EM | Admit: 2022-10-16 | Discharge: 2022-10-17 | Disposition: A | Discharge status: Home / Self Care | Payer: Medicare Other | Attending: Emergency Medicine | Admitting: Emergency Medicine

## 2022-10-16 DIAGNOSIS — J219 Acute bronchiolitis, unspecified: Secondary | ICD-10-CM | POA: Insufficient documentation

## 2022-10-16 DIAGNOSIS — R0602 Shortness of breath: Secondary | ICD-10-CM

## 2022-10-16 DIAGNOSIS — I159 Secondary hypertension, unspecified: Secondary | ICD-10-CM | POA: Diagnosis not present

## 2022-10-16 DIAGNOSIS — I6782 Cerebral ischemia: Secondary | ICD-10-CM | POA: Diagnosis not present

## 2022-10-16 DIAGNOSIS — R062 Wheezing: Secondary | ICD-10-CM | POA: Diagnosis not present

## 2022-10-16 DIAGNOSIS — Z743 Need for continuous supervision: Secondary | ICD-10-CM | POA: Diagnosis not present

## 2022-10-16 DIAGNOSIS — I160 Hypertensive urgency: Secondary | ICD-10-CM | POA: Diagnosis not present

## 2022-10-16 DIAGNOSIS — M899 Disorder of bone, unspecified: Secondary | ICD-10-CM | POA: Diagnosis not present

## 2022-10-16 DIAGNOSIS — R6889 Other general symptoms and signs: Secondary | ICD-10-CM | POA: Diagnosis not present

## 2022-10-16 DIAGNOSIS — I110 Hypertensive heart disease with heart failure: Secondary | ICD-10-CM | POA: Diagnosis not present

## 2022-10-16 DIAGNOSIS — I509 Heart failure, unspecified: Secondary | ICD-10-CM | POA: Insufficient documentation

## 2022-10-16 DIAGNOSIS — J9801 Acute bronchospasm: Secondary | ICD-10-CM | POA: Diagnosis present

## 2022-10-16 DIAGNOSIS — Z79899 Other long term (current) drug therapy: Secondary | ICD-10-CM | POA: Insufficient documentation

## 2022-10-16 DIAGNOSIS — H903 Sensorineural hearing loss, bilateral: Secondary | ICD-10-CM | POA: Diagnosis present

## 2022-10-16 DIAGNOSIS — J9601 Acute respiratory failure with hypoxia: Secondary | ICD-10-CM | POA: Diagnosis not present

## 2022-10-16 DIAGNOSIS — I700 Atherosclerosis of aorta: Secondary | ICD-10-CM | POA: Diagnosis not present

## 2022-10-16 DIAGNOSIS — R404 Transient alteration of awareness: Secondary | ICD-10-CM | POA: Diagnosis not present

## 2022-10-16 DIAGNOSIS — Z1152 Encounter for screening for COVID-19: Secondary | ICD-10-CM | POA: Insufficient documentation

## 2022-10-16 DIAGNOSIS — G9389 Other specified disorders of brain: Secondary | ICD-10-CM | POA: Diagnosis not present

## 2022-10-16 DIAGNOSIS — Z87891 Personal history of nicotine dependence: Secondary | ICD-10-CM | POA: Insufficient documentation

## 2022-10-16 DIAGNOSIS — I10 Essential (primary) hypertension: Secondary | ICD-10-CM | POA: Diagnosis not present

## 2022-10-16 DIAGNOSIS — M7989 Other specified soft tissue disorders: Secondary | ICD-10-CM | POA: Diagnosis present

## 2022-10-16 DIAGNOSIS — R519 Headache, unspecified: Secondary | ICD-10-CM | POA: Diagnosis not present

## 2022-10-16 DIAGNOSIS — I517 Cardiomegaly: Secondary | ICD-10-CM | POA: Diagnosis not present

## 2022-10-16 DIAGNOSIS — G4489 Other headache syndrome: Secondary | ICD-10-CM | POA: Diagnosis not present

## 2022-10-16 DIAGNOSIS — R0689 Other abnormalities of breathing: Secondary | ICD-10-CM | POA: Diagnosis not present

## 2022-10-16 LAB — CBC
HCT: 46.8 % — ABNORMAL HIGH (ref 36.0–46.0)
Hemoglobin: 14.8 g/dL (ref 12.0–15.0)
MCH: 29.3 pg (ref 26.0–34.0)
MCHC: 31.6 g/dL (ref 30.0–36.0)
MCV: 92.7 fL (ref 80.0–100.0)
Platelets: 228 10*3/uL (ref 150–400)
RBC: 5.05 MIL/uL (ref 3.87–5.11)
RDW: 13.5 % (ref 11.5–15.5)
WBC: 8.7 10*3/uL (ref 4.0–10.5)
nRBC: 0 % (ref 0.0–0.2)

## 2022-10-16 LAB — COMPREHENSIVE METABOLIC PANEL
ALT: 16 U/L (ref 0–44)
AST: 20 U/L (ref 15–41)
Albumin: 4 g/dL (ref 3.5–5.0)
Alkaline Phosphatase: 111 U/L (ref 38–126)
Anion gap: 16 — ABNORMAL HIGH (ref 5–15)
BUN: 13 mg/dL (ref 8–23)
CO2: 26 mmol/L (ref 22–32)
Calcium: 9.7 mg/dL (ref 8.9–10.3)
Chloride: 102 mmol/L (ref 98–111)
Creatinine, Ser: 0.77 mg/dL (ref 0.44–1.00)
GFR, Estimated: >60 mL/min (ref >60)
Glucose, Bld: 128 mg/dL — ABNORMAL HIGH (ref 70–99)
Potassium: 4.1 mmol/L (ref 3.5–5.1)
Sodium: 144 mmol/L (ref 135–145)
Total Bilirubin: 0.5 mg/dL (ref 0.3–1.2)
Total Protein: 8.8 g/dL — ABNORMAL HIGH (ref 6.5–8.1)

## 2022-10-16 LAB — RESP PANEL BY RT-PCR (RSV, FLU A&B, COVID)  RVPGX2
Influenza A by PCR: NEGATIVE
Influenza B by PCR: NEGATIVE
Resp Syncytial Virus by PCR: NEGATIVE
SARS Coronavirus 2 by RT PCR: NEGATIVE

## 2022-10-16 LAB — BLOOD GAS, VENOUS
Acid-Base Excess: 3.7 mmol/L — ABNORMAL HIGH (ref 0.0–2.0)
Bicarbonate: 29.7 mmol/L — ABNORMAL HIGH (ref 20.0–28.0)
O2 Saturation: 64.6 %
Patient temperature: 37
pCO2, Ven: 49 mmHg (ref 44–60)
pH, Ven: 7.39 (ref 7.25–7.43)
pO2, Ven: 37 mmHg (ref 32–45)

## 2022-10-16 LAB — TROPONIN I (HIGH SENSITIVITY)
Troponin I (High Sensitivity): 16 ng/L (ref <18)
Troponin I (High Sensitivity): 22 ng/L — ABNORMAL HIGH (ref <18)

## 2022-10-16 LAB — BRAIN NATRIURETIC PEPTIDE: B Natriuretic Peptide: 126.5 pg/mL — ABNORMAL HIGH (ref 0.0–100.0)

## 2022-10-16 LAB — D-DIMER, QUANTITATIVE: D-Dimer, Quant: 0.63 ug{FEU}/mL — ABNORMAL HIGH (ref 0.00–0.50)

## 2022-10-16 MED ORDER — METHYLPREDNISOLONE SODIUM SUCC 125 MG IJ SOLR
125.0000 mg | INTRAMUSCULAR | Status: AC
  Administered 2022-10-16: 125 mg via INTRAVENOUS
  Filled 2022-10-16: qty 2

## 2022-10-16 MED ORDER — ALBUTEROL SULFATE (2.5 MG/3ML) 0.083% IN NEBU
2.5000 mg | INHALATION_SOLUTION | Freq: Once | RESPIRATORY_TRACT | Status: DC
  Filled 2022-10-16: qty 3

## 2022-10-16 MED ORDER — HYDRALAZINE HCL 25 MG PO TABS
25.0000 mg | ORAL_TABLET | Freq: Once | ORAL | Status: AC
  Administered 2022-10-16: 25 mg via ORAL
  Filled 2022-10-16: qty 1

## 2022-10-16 MED ORDER — IPRATROPIUM-ALBUTEROL 0.5-2.5 (3) MG/3ML IN SOLN
3.0000 mL | Freq: Once | RESPIRATORY_TRACT | Status: AC
  Administered 2022-10-16: 3 mL via RESPIRATORY_TRACT
  Filled 2022-10-16: qty 3

## 2022-10-16 NOTE — ED Provider Notes (Signed)
[place] EMERGENCY DEPARTMENT AT [facility] Provider Note   CSN: [id number] Arrival date & time: 10/16/22  1859     History  Chief Complaint  Patient presents with   Shortness of Breath    Pt coming from home with shob and wheezing. Pt has hx of asthma. Was hypertensive upon EMS arrival. Pt is a&ox4. Denies cp    Ashley Johns is a 87 y.o. female.  87 year old female history of mild to moderate AS, CHF with an EF of 50 to 55%, hypertension, and Graves' status post thyroidectomy who presents to the emergency department with headache, hypertension, and difficulty breathing.  Earlier today family saw the patient and Ashley Johns was complaining of a headache, swaying back-and-forth.  Also was complaining of shortness of breath and wheezing.  They noticed that Ashley Johns was somewhat sweaty as well.  Patient was also complaining of some dizziness.  EMS evaluated the patient and gave her some breathing treatments for her shortness of breath and wheezing.  Patient reports that Ashley Johns did take her morning blood pressure medication.  Denies any chest pain.  Says Ashley Johns has had 2 days of left lower extremity swelling as well.       Home Medications Prior to Admission medications   Medication Sig Start Date End Date Taking? Authorizing Provider  acetaminophen (TYLENOL) 650 MG CR tablet Take 650 mg by mouth every 8 (eight) hours as needed for pain.    [provider]  albuterol (VENTOLIN HFA) 108 (90 Base) MCG/ACT inhaler Inhale 2 puffs into the lungs every 4 (four) hours as needed for wheezing or shortness of breath (coughing fits). 08/17/22   Ngetich, [name] C, NP  benzonatate (TESSALON) 100 MG capsule Take 1 capsule (100 mg total) by mouth 2 (two) times daily as needed for cough. 08/24/22   Ngetich, [name] C, NP  carbamide peroxide (DEBROX) 6.5 % OTIC solution Place 5 drops into the right ear 2 (two) times daily. 08/17/22   Ngetich, [name] C, NP  carvedilol (COREG) 3.125 MG tablet Take 2 tablets  (6.25 mg total) by mouth daily with breakfast AND 1 tablet (3.125 mg total) every evening. 07/03/22   Fargo, Amy E, NP  cetirizine (ZYRTEC) 10 MG tablet Take 10 mg by mouth as needed for allergies.    [provider]  Dextromethorphan-guaiFENesin [facility] DM PO) Take 2 capsules by mouth as needed.    [provider]  EPINEPHrine (EPIPEN 2-PAK) 0.3 mg/0.3 mL IJ SOAJ injection Inject 0.3 mg into the muscle as needed. Inject 0.3mg  intramuscular route once as needed. 02/22/20   Ngetich, [name] C, NP  fluticasone (FLONASE) 50 MCG/ACT nasal spray SHAKE LIQUID AND USE 1 SPRAY IN EACH NOSTRIL TWICE DAILY AS NEEDED FOR NASAL CONGESTION 12/12/21   Ellamae Sia, DO  meloxicam (MOBIC) 7.5 MG tablet Take 1 tablet (7.5 mg total) by mouth daily as needed for pain. May increase to two daily if ineffective and notify provider 08/17/22   Ngetich, [name] C, NP  mometasone (ELOCON) 0.1 % cream Apply 1 Application topically daily as needed. 08/24/22   Ngetich, [name] C, NP  montelukast (SINGULAIR) 10 MG tablet Take 1 tablet (10 mg total) by mouth at bedtime. 07/03/22   Fargo, Amy E, NP  olmesartan (BENICAR) 40 MG tablet TAKE 1 TABLET(40 MG) BY MOUTH DAILY 06/15/22   Ngetich, [name] C, NP  simvastatin (ZOCOR) 20 MG tablet TAKE 1 TABLET(20 MG) BY MOUTH DAILY 10/14/21   Ngetich, Donalee Citrin, NP  Tiotropium Bromide Monohydrate (  SPIRIVA RESPIMAT) 1.25 MCG/ACT AERS Inhale 2 puffs into the lungs daily. 07/03/22   Fargo, Amy E, NP      Allergies    Codeine, Tramadol, and Aspirin    Review of Systems   Review of Systems  Physical Exam Updated Vital Signs BP (!) 176/90   Pulse 94   Temp 98.6 F (37 C) (Oral)   Resp (!) 33   SpO2 96%  Physical Exam Vitals and nursing note reviewed.  Constitutional:      General: Ashley Johns is not in acute distress.    Appearance: Ashley Johns is well-developed.  HENT:     Head: Normocephalic and atraumatic.     Right Ear: External ear normal.     Left Ear: External ear normal.     Nose: Nose  normal.  Eyes:     Extraocular Movements: Extraocular movements intact.     Conjunctiva/sclera: Conjunctivae normal.     Pupils: Pupils are equal, round, and reactive to light.     Comments: Pupils 4 mm bilaterally  Cardiovascular:     Rate and Rhythm: Regular rhythm. Tachycardia present.     Heart sounds: No murmur heard. Pulmonary:     Effort: Pulmonary effort is normal. No respiratory distress.     Breath sounds: Wheezing (Diffuse expiratory) present.  Abdominal:     General: Abdomen is flat.     Palpations: Abdomen is soft.  Musculoskeletal:     Cervical back: Normal range of motion and neck supple.     Right lower leg: Edema (1+) present.     Left lower leg: Edema (2+) present.  Skin:    General: Skin is warm and dry.  Neurological:     Mental Status: Ashley Johns is alert and oriented to person, place, and time. Mental status is at baseline.     Cranial Nerves: No cranial nerve deficit.     Sensory: No sensory deficit.     Motor: No weakness.  Psychiatric:        Mood and Affect: Mood normal.     ED Results / Procedures / Treatments   Labs (all labs ordered are listed, but only abnormal results are displayed) Labs Reviewed  COMPREHENSIVE METABOLIC PANEL - Abnormal; Notable for the following components:      Result Value   Glucose, Bld 128 (*)    Total Protein 8.8 (*)    Anion gap 16 (*)    All other components within normal limits  CBC - Abnormal; Notable for the following components:   HCT 46.8 (*)    All other components within normal limits  BRAIN NATRIURETIC PEPTIDE - Abnormal; Notable for the following components:   B Natriuretic Peptide 126.5 (*)    All other components within normal limits  D-DIMER, QUANTITATIVE - Abnormal; Notable for the following components:   D-Dimer, Quant 0.63 (*)    All other components within normal limits  BLOOD GAS, VENOUS - Abnormal; Notable for the following components:   Bicarbonate 29.7 (*)    Acid-Base Excess 3.7 (*)    All  other components within normal limits  TROPONIN I (HIGH SENSITIVITY) - Abnormal; Notable for the following components:   Troponin I (High Sensitivity) 22 (*)    All other components within normal limits  RESP PANEL BY RT-PCR (RSV, FLU A&B, COVID)  [id number]  TROPONIN I (HIGH SENSITIVITY)    EKG EKG Interpretation Date/Time:  Friday October 16 2022 19:00:33 EDT Ventricular Rate:  94 PR Interval:  195 QRS Duration:  83 QT Interval:  358 QTC Calculation: 448 R Axis:   64  Text Interpretation: Sinus rhythm Atrial premature complexes in couplets LAE, consider biatrial enlargement Left ventricular hypertrophy Confirmed by Vonita Moss [phone]) on 10/16/2022 8:59:36 PM  Radiology CT Head Wo Contrast  Result Date: 10/16/2022 CLINICAL DATA:  Headache and hypertension. EXAM: CT HEAD WITHOUT CONTRAST TECHNIQUE: Contiguous axial images were obtained from the base of the skull through the vertex without intravenous contrast. RADIATION DOSE REDUCTION: This exam was performed according to the departmental dose-optimization program which includes automated exposure control, adjustment of the mA and/or kV according to patient size and/or use of iterative reconstruction technique. COMPARISON:  None Available. FINDINGS: Brain: There is mild cerebral atrophy with widening of the extra-axial spaces and ventricular dilatation. There are areas of decreased attenuation within the white matter tracts of the supratentorial brain, consistent with microvascular disease changes. Vascular: No hyperdense vessel or unexpected calcification. Skull: A 3.2 cm x 1.2 cm x 1.4 cm mildly expansile, mixed lytic and sclerotic area is seen within the right frontal bone. Sinuses/Orbits: No acute finding. Other: None. IMPRESSION: 1. Generalized cerebral atrophy with chronic white matter small vessel ischemic changes. 2. No acute intracranial abnormality. 3. Mildly expansile, mixed lytic and sclerotic area within the right frontal bone.  Correlation with nonemergent follow-up MRI is recommended. Electronically Signed   By: Aram Candela M.D.   On: 10/16/2022 22:39   DG Chest Port 1 View  Result Date: 10/16/2022 CLINICAL DATA:  Shortness of breath EXAM: PORTABLE CHEST 1 VIEW COMPARISON:  01/11/2018 FINDINGS: Mild cardiomegaly. No acute airspace disease or effusion. Aortic atherosclerosis. No pneumothorax. Scoliosis of the spine. Mildly prominent hilar vessels, possible arterial hypertension IMPRESSION: Mild cardiomegaly. Electronically Signed   By: Jasmine Pang M.D.   On: 10/16/2022 20:32    Procedures Procedures    Medications Ordered in ED Medications  albuterol (PROVENTIL) (2.5 MG/3ML) 0.083% nebulizer solution 2.5 mg (has no administration in time range)  hydrALAZINE (APRESOLINE) tablet 25 mg (25 mg Oral Given 10/16/22 2000)  ipratropium-albuterol (DUONEB) 0.5-2.5 (3) MG/3ML nebulizer solution 3 mL (3 mLs Nebulization Given 10/16/22 1951)  methylPREDNISolone sodium succinate (SOLU-MEDROL) 125 mg/2 mL injection 125 mg (125 mg Intravenous Given 10/16/22 2031)    ED Course/ Medical Decision Making/ A&P Clinical Course as of 10/16/22 2332  Fri Oct 16, 2022  2256 Dr Antionette Char [RP]    Clinical Course User Index [RP] Rondel Baton, MD                                 Medical Decision Making Amount and/or Complexity of Data Reviewed Labs: ordered. Radiology: ordered.  Risk Prescription drug management. Decision regarding hospitalization.   EUSTACIA KUBAS is a 87 y.o. female with comorbidities that complicate the patient evaluation including  mild to moderate AS, CHF with an EF of 50 to 55%, hypertension, and Graves' status post thyroidectomy who presents to the emergency department with headache, hypertension, and difficulty breathing.   Initial Ddx:  Flash pulmonary edema, CHF exacerbation, COPD/asthma exacerbation, URI  MDM/Course:  Patient presents to the emergency department with hypertension and difficulty  breathing.  Also is reporting a headache.  Had diffuse wheezing on exam that was concerning for possible COPD/asthma exacerbation.  Was given steroids and DuoNebs.  Did have a new 2 L oxygen requirement.  Was concerned initially about a hypertensive emergency and gave her oral hydralazine with improvement of her  blood pressures from the 200s to the 170s and 180s.  Had a chest x-ray that did not show evidence of pulmonary edema.  BNP was just marginally elevated.  COVID and flu were negative.  Patient also was complaining of left leg swelling and with her shortness of breath a D-dimer was sent which was within age-adjusted limits so feel that DVT/PE is highly unlikely.  Did ultimately have a head CT as well with her headache and hypertension which did not show any evidence of ICH.  Upon re-evaluation patient's blood pressure remained improved but Ashley Johns had persistent oxygen requirements will admit to medicine for COPD exacerbation.  This patient presents to the ED for concern of complaints listed in HPI, this involves an extensive number of treatment options, and is a complaint that carries with it a high risk of complications and morbidity. Disposition including potential need for admission considered.   Dispo: Admit to Floor  Additional history obtained from daughter Records reviewed Outpatient Clinic Notes The following labs were independently interpreted: Chemistry and show no acute abnormality I independently reviewed the following imaging with scope of interpretation limited to determining acute life threatening conditions related to emergency care: CT Head and agree with the radiologist interpretation with the following exceptions: none I personally reviewed and interpreted cardiac monitoring: normal sinus rhythm  I personally reviewed and interpreted the pt's EKG: see above for interpretation  I have reviewed the patients home medications and made adjustments as needed Consults:  Hospitalist Social Determinants of health:  Elderly         Final Clinical Impression(s) / ED Diagnoses Final diagnoses:  Wheezing  Shortness of breath  Secondary hypertension    Rx / DC Orders ED Discharge Orders     None         Rondel Baton, MD 10/16/22 2332

## 2022-10-17 ENCOUNTER — Inpatient Hospital Stay (HOSPITAL_COMMUNITY): Payer: Medicare Other

## 2022-10-17 ENCOUNTER — Other Ambulatory Visit: Payer: Self-pay

## 2022-10-17 ENCOUNTER — Encounter (HOSPITAL_COMMUNITY): Payer: Self-pay | Admitting: Family Medicine

## 2022-10-17 DIAGNOSIS — R519 Headache, unspecified: Secondary | ICD-10-CM | POA: Diagnosis not present

## 2022-10-17 DIAGNOSIS — I2510 Atherosclerotic heart disease of native coronary artery without angina pectoris: Secondary | ICD-10-CM | POA: Diagnosis not present

## 2022-10-17 DIAGNOSIS — M899 Disorder of bone, unspecified: Secondary | ICD-10-CM | POA: Diagnosis present

## 2022-10-17 DIAGNOSIS — R918 Other nonspecific abnormal finding of lung field: Secondary | ICD-10-CM | POA: Diagnosis not present

## 2022-10-17 DIAGNOSIS — E0500 Thyrotoxicosis with diffuse goiter without thyrotoxic crisis or storm: Secondary | ICD-10-CM | POA: Diagnosis not present

## 2022-10-17 DIAGNOSIS — I10 Essential (primary) hypertension: Secondary | ICD-10-CM | POA: Diagnosis not present

## 2022-10-17 DIAGNOSIS — I509 Heart failure, unspecified: Secondary | ICD-10-CM | POA: Diagnosis not present

## 2022-10-17 DIAGNOSIS — I160 Hypertensive urgency: Secondary | ICD-10-CM | POA: Diagnosis not present

## 2022-10-17 DIAGNOSIS — J9801 Acute bronchospasm: Secondary | ICD-10-CM | POA: Diagnosis present

## 2022-10-17 DIAGNOSIS — M7989 Other specified soft tissue disorders: Secondary | ICD-10-CM | POA: Diagnosis present

## 2022-10-17 LAB — BASIC METABOLIC PANEL
Anion gap: 13 (ref 5–15)
BUN: 12 mg/dL (ref 8–23)
CO2: 22 mmol/L (ref 22–32)
Calcium: 8.8 mg/dL — ABNORMAL LOW (ref 8.9–10.3)
Chloride: 99 mmol/L (ref 98–111)
Creatinine, Ser: 0.87 mg/dL (ref 0.44–1.00)
GFR, Estimated: >60 mL/min (ref >60)
Glucose, Bld: 190 mg/dL — ABNORMAL HIGH (ref 70–99)
Potassium: 3.6 mmol/L (ref 3.5–5.1)
Sodium: 138 mmol/L (ref 135–145)

## 2022-10-17 LAB — CBC
HCT: 46.3 % — ABNORMAL HIGH (ref 36.0–46.0)
Hemoglobin: 15.1 g/dL — ABNORMAL HIGH (ref 12.0–15.0)
MCH: 29.4 pg (ref 26.0–34.0)
MCHC: 32.6 g/dL (ref 30.0–36.0)
MCV: 90.1 fL (ref 80.0–100.0)
Platelets: 218 10*3/uL (ref 150–400)
RBC: 5.14 MIL/uL — ABNORMAL HIGH (ref 3.87–5.11)
RDW: 13.5 % (ref 11.5–15.5)
WBC: 4 10*3/uL (ref 4.0–10.5)
nRBC: 0 % (ref 0.0–0.2)

## 2022-10-17 LAB — MAGNESIUM: Magnesium: 1.8 mg/dL (ref 1.7–2.4)

## 2022-10-17 MED ORDER — ENOXAPARIN SODIUM 40 MG/0.4ML IJ SOSY: 40.0000 mg | PREFILLED_SYRINGE | Freq: Every day | INTRAMUSCULAR | Status: DC

## 2022-10-17 MED ORDER — SENNOSIDES-DOCUSATE SODIUM 8.6-50 MG PO TABS: 1.0000 | ORAL_TABLET | Freq: Every evening | ORAL | Status: DC | PRN

## 2022-10-17 MED ORDER — GADOBUTROL 1 MMOL/ML IV SOLN
7.0000 mL | Freq: Once | INTRAVENOUS | Status: AC | PRN
  Administered 2022-10-17: 7 mL via INTRAVENOUS

## 2022-10-17 MED ORDER — ACETAMINOPHEN 650 MG RE SUPP: 650.0000 mg | Freq: Four times a day (QID) | RECTAL | Status: DC | PRN

## 2022-10-17 MED ORDER — CARVEDILOL 3.125 MG PO TABS: 3.1250 mg | ORAL_TABLET | Freq: Every evening | ORAL | Status: DC

## 2022-10-17 MED ORDER — ACETAMINOPHEN 325 MG PO TABS: 650.0000 mg | ORAL_TABLET | Freq: Four times a day (QID) | ORAL | Status: DC | PRN

## 2022-10-17 MED ORDER — AMLODIPINE BESYLATE 5 MG PO TABS: 5.0000 mg | ORAL_TABLET | Freq: Every day | ORAL | 1 refills | Status: DC

## 2022-10-17 MED ORDER — OXYCODONE HCL 5 MG PO TABS: 5.0000 mg | ORAL_TABLET | ORAL | Status: DC | PRN

## 2022-10-17 MED ORDER — AMLODIPINE BESYLATE 5 MG PO TABS
5.0000 mg | ORAL_TABLET | Freq: Every day | ORAL | Status: DC
  Administered 2022-10-17: 5 mg via ORAL
  Filled 2022-10-17: qty 1

## 2022-10-17 MED ORDER — IOHEXOL 350 MG/ML SOLN
75.0000 mL | Freq: Once | INTRAVENOUS | Status: AC | PRN
  Administered 2022-10-17: 75 mL via INTRAVENOUS

## 2022-10-17 MED ORDER — ONDANSETRON HCL 4 MG PO TABS: 4.0000 mg | ORAL_TABLET | Freq: Four times a day (QID) | ORAL | Status: DC | PRN

## 2022-10-17 MED ORDER — SIMVASTATIN 20 MG PO TABS: 20.0000 mg | ORAL_TABLET | Freq: Every day | ORAL | Status: DC

## 2022-10-17 MED ORDER — ALBUTEROL SULFATE (2.5 MG/3ML) 0.083% IN NEBU: 2.5000 mg | INHALATION_SOLUTION | RESPIRATORY_TRACT | Status: DC | PRN

## 2022-10-17 MED ORDER — PREDNISONE 20 MG PO TABS
40.0000 mg | ORAL_TABLET | Freq: Every day | ORAL | Status: DC
  Administered 2022-10-17: 40 mg via ORAL
  Filled 2022-10-17: qty 2

## 2022-10-17 MED ORDER — CARVEDILOL 6.25 MG PO TABS
6.2500 mg | ORAL_TABLET | Freq: Every day | ORAL | Status: DC
  Administered 2022-10-17: 6.25 mg via ORAL
  Filled 2022-10-17: qty 1

## 2022-10-17 MED ORDER — ONDANSETRON HCL 4 MG/2ML IJ SOLN: 4.0000 mg | Freq: Four times a day (QID) | INTRAMUSCULAR | Status: DC | PRN

## 2022-10-17 MED ORDER — SODIUM CHLORIDE 0.9% FLUSH: 3.0000 mL | Freq: Two times a day (BID) | INTRAVENOUS | Status: DC

## 2022-10-17 MED ORDER — ATORVASTATIN CALCIUM 20 MG PO TABS: 20.0000 mg | ORAL_TABLET | Freq: Every day | ORAL | 1 refills | Status: DC

## 2022-10-17 MED ORDER — LABETALOL HCL 5 MG/ML IV SOLN
10.0000 mg | INTRAVENOUS | Status: DC | PRN
  Administered 2022-10-17: 10 mg via INTRAVENOUS
  Filled 2022-10-17: qty 4

## 2022-10-17 MED ORDER — ATORVASTATIN CALCIUM 10 MG PO TABS
20.0000 mg | ORAL_TABLET | Freq: Every day | ORAL | Status: DC
  Administered 2022-10-17: 20 mg via ORAL
  Filled 2022-10-17: qty 2

## 2022-10-17 MED ORDER — IRBESARTAN 150 MG PO TABS
300.0000 mg | ORAL_TABLET | Freq: Every day | ORAL | Status: DC
  Administered 2022-10-17: 300 mg via ORAL
  Filled 2022-10-17: qty 2

## 2022-10-17 NOTE — Care Management Obs Status (Signed)
MEDICARE OBSERVATION STATUS NOTIFICATION   Patient Details  Name: Ashley Johns MRN: [id number] Date of Birth: 06/30/1935   Medicare Observation Status Notification Given:  Yes    Ronny Bacon, RN 10/17/2022, 11:51 AM

## 2022-10-17 NOTE — H&P (Signed)
History and Physical    Ashley Johns ZOX:[id number] DOB: [birth year] DOA: 10/16/2022  PCP: Ashley Bookman, NP   Patient coming from: Home   Chief Complaint: Headache, SOB, left leg swelling  HPI: Ashley Johns is a pleasant 87 y.o. female with medical history significant for hypertension, Graves' disease, and prior smoking history who presents with headache, shortness of breath, and left leg swelling.  Patient reports that she became more short of breath than usual 2 days ago while trying to go up some steps.  Since then, she has been increasingly short of breath and has had a cough that is sometimes productive of clear sputum.  She has also noticed that her left leg has been swelling recently.  Family was not aware that she had been experiencing shortness of breath but noted that she appeared to be in pain today, complained of a headache, and shared with her family that she had been short of breath and wheezing.  EMS was called and administered breathing treatments prior to arrival in the ED.  Patient reports history of intermittent right frontal headache going back more than 60 years.  The headache she presented with has resolved by time of admission.  ED Course: Upon arrival to the ED, patient is found to be afebrile and saturating upper 80s on room air with tachypnea, slightly elevated heart rate, and blood pressure as high as 208/132.  EKG demonstrates sinus rhythm with PAC and LVH.  Head CT is negative for acute intracranial abnormality but notable for mixed lytic and sclerotic area involving the right frontal bone.  Labs are most notable for normal WBC, negative respiratory virus panel, D-dimer 0.63, and BNP 127.   She was started on supplemental oxygen and treated with IV Solu-Medrol, hydralazine, albuterol, and DuoNeb in the ED.  Review of Systems:  All other systems reviewed and apart from HPI, are negative.  Past Medical History:  Diagnosis Date   Eczema    Per [facility] new  patient packet   Graves disease    Per [facility] new patient packet   High blood pressure    Per [facility] new patient packet   Hypertension     Past Surgical History:  Procedure Laterality Date   TOTAL ABDOMINAL HYSTERECTOMY Bilateral 1970's   TUBAL LIGATION      Social History:   reports that she has quit smoking. She has never used smokeless tobacco. She reports that she does not drink alcohol and does not use drugs.  Allergies  Allergen Reactions   Codeine Nausea Only    unknown   Tramadol Nausea Only   Aspirin Rash    Family History  Problem Relation Age of Onset   Tuberculosis Mother    Hypertension Father      Prior to Admission medications   Medication Sig Start Date End Date Taking? Authorizing Provider  acetaminophen (TYLENOL) 650 MG CR tablet Take 650 mg by mouth every 8 (eight) hours as needed for pain.    [provider]  albuterol (VENTOLIN HFA) 108 (90 Base) MCG/ACT inhaler Inhale 2 puffs into the lungs every 4 (four) hours as needed for wheezing or shortness of breath (coughing fits). 08/17/22   Johns, Ashley C, NP  benzonatate (TESSALON) 100 MG capsule Take 1 capsule (100 mg total) by mouth 2 (two) times daily as needed for cough. 08/24/22   Johns, Ashley C, NP  carbamide peroxide (DEBROX) 6.5 % OTIC solution Place 5 drops into the right ear 2 (two) times  daily. 08/17/22   Johns, Ashley C, NP  carvedilol (COREG) 3.125 MG tablet Take 2 tablets (6.25 mg total) by mouth daily with breakfast AND 1 tablet (3.125 mg total) every evening. 07/03/22   Johns, Ashley E, NP  cetirizine (ZYRTEC) 10 MG tablet Take 10 mg by mouth as needed for allergies.    [provider]  Dextromethorphan-guaiFENesin [facility] DM PO) Take 2 capsules by mouth as needed.    [provider]  EPINEPHrine (EPIPEN 2-PAK) 0.3 mg/0.3 mL IJ SOAJ injection Inject 0.3 mg into the muscle as needed. Inject 0.3mg  intramuscular route once as needed. 02/22/20   Johns, Ashley C, NP  fluticasone  (FLONASE) 50 MCG/ACT nasal spray SHAKE LIQUID AND USE 1 SPRAY IN EACH NOSTRIL TWICE DAILY AS NEEDED FOR NASAL CONGESTION 12/12/21   Ashley Sia, DO  meloxicam (MOBIC) 7.5 MG tablet Take 1 tablet (7.5 mg total) by mouth daily as needed for pain. May increase to two daily if ineffective and notify provider 08/17/22   Johns, Ashley C, NP  mometasone (ELOCON) 0.1 % cream Apply 1 Application topically daily as needed. 08/24/22   Johns, Ashley C, NP  montelukast (SINGULAIR) 10 MG tablet Take 1 tablet (10 mg total) by mouth at bedtime. 07/03/22   Johns, Ashley E, NP  olmesartan (BENICAR) 40 MG tablet TAKE 1 TABLET(40 MG) BY MOUTH DAILY 06/15/22   Johns, Ashley C, NP  simvastatin (ZOCOR) 20 MG tablet TAKE 1 TABLET(20 MG) BY MOUTH DAILY 10/14/21   Johns, Ashley C, NP  Tiotropium Bromide Monohydrate (SPIRIVA RESPIMAT) 1.25 MCG/ACT AERS Inhale 2 puffs into the lungs daily. 07/03/22   Ashley Heir, NP    Physical Exam: Vitals:   10/16/22 2315 10/16/22 2330 10/16/22 2345 10/17/22 0000  BP: (!) 187/95 (!) 200/99 (!) 201/97 (!) 184/97  Pulse: 83 91 98 83  Resp: (!) 32 (!) 31 (!) 30 (!) 23  Temp:      TempSrc:      SpO2: 97% 96% 96% 96%    Constitutional: NAD, calm  Eyes: PERTLA, lids and conjunctivae normal ENMT: Mucous membranes are moist. Posterior pharynx clear of any exudate or lesions.   Neck: supple, no masses  Respiratory: Prolonged expiratory phase, wheezes. No accessory muscle use.  Cardiovascular: S1 & S2 heard, regular rate and rhythm. No JVD. Abdomen: No distension, no tenderness, soft. Bowel sounds active.  Musculoskeletal: no clubbing / cyanosis. No joint deformity upper and lower extremities.   Skin: no significant rashes, lesions, ulcers. Warm, dry, well-perfused. Neurologic: Gross hearing deficit, CN 2-12 grossly intact otherwise. Moving all extremities. Alert and oriented.  Psychiatric: Pleasant. Cooperative.    Labs and Imaging on Admission: I have personally reviewed following labs and  imaging studies  CBC: Recent Labs  Lab 10/16/22 1952  WBC 8.7  HGB 14.8  HCT 46.8*  MCV 92.7  PLT 228   Basic Metabolic Panel: Recent Labs  Lab 10/16/22 1952  NA 144  K 4.1  CL 102  CO2 26  GLUCOSE 128*  BUN 13  CREATININE 0.77  CALCIUM 9.7   GFR: CrCl cannot be calculated (Unknown ideal weight.). Liver Function Tests: Recent Labs  Lab 10/16/22 1952  AST 20  ALT 16  ALKPHOS 111  BILITOT 0.5  PROT 8.8*  ALBUMIN 4.0   No results for input(s): "LIPASE", "AMYLASE" in the last 168 hours. No results for input(s): "AMMONIA" in the last 168 hours. Coagulation Profile: No results for input(s): "INR", "PROTIME" in the last 168 hours. Cardiac  Enzymes: No results for input(s): "CKTOTAL", "CKMB", "CKMBINDEX", "TROPONINI" in the last 168 hours. BNP (last 3 results) No results for input(s): "PROBNP" in the last 8760 hours. HbA1C: No results for input(s): "HGBA1C" in the last 72 hours. CBG: No results for input(s): "GLUCAP" in the last 168 hours. Lipid Profile: No results for input(s): "CHOL", "HDL", "LDLCALC", "TRIG", "CHOLHDL", "LDLDIRECT" in the last 72 hours. Thyroid Function Tests: No results for input(s): "TSH", "T4TOTAL", "FREET4", "T3FREE", "THYROIDAB" in the last 72 hours. Anemia Panel: No results for input(s): "VITAMINB12", "FOLATE", "FERRITIN", "TIBC", "IRON", "RETICCTPCT" in the last 72 hours. Urine analysis:    Component Value Date/Time   BILIRUBINUR Negative 08/15/2021 1015   PROTEINUR Negative 08/15/2021 1015   UROBILINOGEN negative (A) 08/15/2021 1015   NITRITE Negative 08/15/2021 1015   LEUKOCYTESUR Large (3+) (A) 08/15/2021 1015   Sepsis Labs: @LABRCNTIP (procalcitonin:4,lacticidven:4) ) Recent Results (from the past 240 hour(s))  Resp panel by RT-PCR (RSV, Flu A&B, Covid) Anterior Nasal Swab     Status: None   Collection Time: 10/16/22  8:52 PM   Specimen: Anterior Nasal Swab  Result Value Ref Range Status   SARS Coronavirus 2 by RT PCR  NEGATIVE NEGATIVE Final   Influenza A by PCR NEGATIVE NEGATIVE Final   Influenza B by PCR NEGATIVE NEGATIVE Final    Comment: (NOTE) The Xpert Xpress SARS-CoV-2/FLU/RSV plus assay is intended as an aid in the diagnosis of influenza from Nasopharyngeal swab specimens and should not be used as a sole basis for treatment. Nasal washings and aspirates are unacceptable for Xpert Xpress SARS-CoV-2/FLU/RSV testing.  Fact Sheet for Patients: BloggerCourse.com  Fact Sheet for Healthcare Providers: SeriousBroker.it  This test is not yet approved or cleared by the [place] FDA and has been authorized for detection and/or diagnosis of SARS-CoV-2 by FDA under an Emergency Use Authorization (EUA). This EUA will remain in effect (meaning this test can be used) for the duration of the COVID-19 declaration under Section 564(b)(1) of the Act, 21 U.S.C. section 360bbb-3(b)(1), unless the authorization is terminated or revoked.     Resp Syncytial Virus by PCR NEGATIVE NEGATIVE Final    Comment: (NOTE) Fact Sheet for Patients: BloggerCourse.com  Fact Sheet for Healthcare Providers: SeriousBroker.it  This test is not yet approved or cleared by the [place] FDA and has been authorized for detection and/or diagnosis of SARS-CoV-2 by FDA under an Emergency Use Authorization (EUA). This EUA will remain in effect (meaning this test can be used) for the duration of the COVID-19 declaration under Section 564(b)(1) of the Act, 21 U.S.C. section 360bbb-3(b)(1), unless the authorization is terminated or revoked.  Performed at [facility], [address], [place], [place] [address]      Radiological Exams on Admission: CT Head Wo Contrast  Result Date: 10/16/2022 CLINICAL DATA:  Headache and hypertension. EXAM: CT HEAD WITHOUT CONTRAST TECHNIQUE: Contiguous axial images were obtained from  the base of the skull through the vertex without intravenous contrast. RADIATION DOSE REDUCTION: This exam was performed according to the departmental dose-optimization program which includes automated exposure control, adjustment of the mA and/or kV according to patient size and/or use of iterative reconstruction technique. COMPARISON:  None Available. FINDINGS: Brain: There is mild cerebral atrophy with widening of the extra-axial spaces and ventricular dilatation. There are areas of decreased attenuation within the white matter tracts of the supratentorial brain, consistent with microvascular disease changes. Vascular: No hyperdense vessel or unexpected calcification. Skull: A 3.2 cm x 1.2 cm x 1.4 cm  mildly expansile, mixed lytic and sclerotic area is seen within the right frontal bone. Sinuses/Orbits: No acute finding. Other: None. IMPRESSION: 1. Generalized cerebral atrophy with chronic white matter small vessel ischemic changes. 2. No acute intracranial abnormality. 3. Mildly expansile, mixed lytic and sclerotic area within the right frontal bone. Correlation with nonemergent follow-up MRI is recommended. Electronically Signed   By: Aram Candela M.D.   On: 10/16/2022 22:39   DG Chest Port 1 View  Result Date: 10/16/2022 CLINICAL DATA:  Shortness of breath EXAM: PORTABLE CHEST 1 VIEW COMPARISON:  01/11/2018 FINDINGS: Mild cardiomegaly. No acute airspace disease or effusion. Aortic atherosclerosis. No pneumothorax. Scoliosis of the spine. Mildly prominent hilar vessels, possible arterial hypertension IMPRESSION: Mild cardiomegaly. Electronically Signed   By: Jasmine Pang M.D.   On: 10/16/2022 20:32    EKG: Independently reviewed. Sinus rhythm, PAC, LVH.   Assessment/Plan   1. Acute hypoxic respiratory failure; wheezing  - No acute findings noted on CXR; recent LLE swelling raises concern for PE; prominent wheezing in ED; BNP slightly elevated but appears euvolemic  - Check CTA chest, continue  systemic steroid, as-needed albuterol, and as-needed supplemental O2    2. Hypertensive urgency  - BP severely elevated in ED and improved with hydralazine  - Give evening dose of Coreg now, continue ARB, use labetalol as needed    3. Skull lesion  - Noted on head CT  - Patient reports intermittent pain in this area for ~60 years  - Obtain MRI for further characterization     DVT prophylaxis: Lovenox  Code Status: Full  Level of Care: Level of care: Progressive Family Communication: Son and daughter at bedside  Disposition Plan:  Patient is from: home  Anticipated d/c is to: TBD Anticipated d/c date is: 10/20/22  Patient currently: Pending CTA chest, MRI head, improved/stable respiratory status  Consults called: None  Admission status: Inpatient     Briscoe Deutscher, MD [facility]  10/17/2022, 12:18 AM

## 2022-10-17 NOTE — Plan of Care (Signed)
  Problem: Education: Goal: Knowledge of disease or condition will improve Outcome: Progressing Goal: Knowledge of the prescribed therapeutic regimen will improve Outcome: Progressing   Problem: Activity: Goal: Ability to tolerate increased activity will improve Outcome: Progressing   Problem: Respiratory: Goal: Ability to maintain a clear airway will improve Outcome: Progressing Goal: Levels of oxygenation will improve Outcome: Progressing Goal: Ability to maintain adequate ventilation will improve Outcome: Progressing

## 2022-10-17 NOTE — ED Notes (Signed)
Pt on 3 liters of nasal 02

## 2022-10-17 NOTE — Care Management Obs Status (Signed)
MEDICARE OBSERVATION STATUS NOTIFICATION   Patient Details  Name: DYNASTIE DEMAIO MRN: [id number] Date of Birth: 06/30/1935   Medicare Observation Status Notification Given:  Yes    Ronny Bacon, RN 10/17/2022, 11:51 AM

## 2022-10-17 NOTE — Discharge Summary (Addendum)
Physician Discharge Summary  Ashley Johns ZOX:[id number] DOB: 07-10-1935 DOA: 10/16/2022  PCP: Ashley Bookman, NP  Admit date: 10/16/2022 Discharge date: 10/17/2022  Admitted From: Home Disposition:  Home  Recommendations for Outpatient Follow-up:  Follow up with PCP in 1 week Recommended DASH diet, to keep a blood pressure log and check blood pressure daily for the next 2 weeks. Recommend outpatient head CT in 3 months to follow-up on brain MRI result of right frontal bone lesion, likely vascular in nature. Follow up on incidental CT findings:  -Two tiny nodules in the right upper lobe, largest 4 mm. No follow-up needed if patient is low-risk (and has no known or suspected primary neoplasm). Non-contrast chest CT can be considered in 12 months if patient is high-risk -Mildly enlarged heterogeneous thyroid with nodules, largest 1.6cm. Nonemergent ultrasound follow-up is recommended.   Discharge Condition: Stable CODE STATUS: Full code Diet recommendation: Heart healthy diet  Brief/Interim Summary: From H&P: Ashley Johns is a pleasant 87 y.o. female with medical history significant for hypertension, Graves' disease, and prior smoking history who presents with headache, shortness of breath, and left leg swelling.   Patient reports that she became more short of breath than usual 2 days ago while trying to go up some steps.  Since then, she has been increasingly short of breath and has had a cough that is sometimes productive of clear sputum.  She has also noticed that her left leg has been swelling recently.   Family was not aware that she had been experiencing shortness of breath but noted that she appeared to be in pain today, complained of a headache, and shared with her family that she had been short of breath and wheezing.  EMS was called and administered breathing treatments prior to arrival in the ED.   Patient reports history of intermittent right frontal headache going back more  than 60 years.  The headache she presented with has resolved by time of admission.   ED Course: Upon arrival to the ED, patient is found to be afebrile and saturating upper 80s on room air with tachypnea, slightly elevated heart rate, and blood pressure as high as 208/132.  EKG demonstrates sinus rhythm with PAC and LVH.  Head CT is negative for acute intracranial abnormality but notable for mixed lytic and sclerotic area involving the right frontal bone.  Labs are most notable for normal WBC, negative respiratory virus panel, D-dimer 0.63, and BNP 127.    She was started on supplemental oxygen and treated with IV Solu-Medrol, hydralazine, albuterol, and DuoNeb in the ED.  Subjective on day of discharge: Patient underwent MRI brain with results as below.  Recommended for outpatient repeat CT head.  Her blood pressure had improved on Coreg and ARB.  Norvasc was added.  Pharmacy recommended changing simvastatin to atorvastatin with concomitant use of Norvasc.  Discussed with patient and family regarding low-sodium diet, DASH diet, checking daily blood pressure.  Patient was feeling much better, denied worsening shortness of breath, no wheezing on examination and was discharged home in stable and improved condition  Discharge Diagnoses:   Principal Problem:   Hypertensive urgency Active Problems:   Essential hypertension   Sensorineural hearing loss (SNHL) of both ears   Acute respiratory failure with hypoxia (HCC)   Skull lesion   Left leg swelling   Bronchospasm   Discharge Instructions  Discharge Instructions     Call MD for:  difficulty breathing, headache or visual disturbances   Complete by:  As directed    Call MD for:  extreme fatigue   Complete by: As directed    Call MD for:  persistant dizziness or light-headedness   Complete by: As directed    Call MD for:  persistant nausea and vomiting   Complete by: As directed    Call MD for:  severe uncontrolled pain   Complete by: As  directed    Call MD for:  temperature >100.4   Complete by: As directed    Diet - low sodium heart healthy   Complete by: As directed    Discharge instructions   Complete by: As directed    1. Follow up with PCP in 1 week 2. Recommended DASH diet, keep a blood pressure log and check blood pressure daily in the morning in sitting and comfortable and calm position for the next 2 weeks. 3. Recommend outpatient head CT in 3 months to follow-up on brain MRI result of right frontal bone lesion, likely vascular in nature. 4. Simvastatin was changed to Atorvastatin due to interaction with Amlodipine.   You were cared for by a hospitalist during your hospital stay. If you have any questions about your discharge medications or the care you received while you were in the hospital after you are discharged, you can call the unit and ask to speak with the hospitalist on call if the hospitalist that took care of you is not available. Once you are discharged, your primary care physician will handle any further medical issues. Please note that NO REFILLS for any discharge medications will be authorized once you are discharged, as it is imperative that you return to your primary care physician (or establish a relationship with a primary care physician if you do not have one) for your aftercare needs so that they can reassess your need for medications and monitor your lab values.   Increase activity slowly   Complete by: As directed       Allergies as of 10/17/2022       Reactions   Codeine Nausea Only   unknown   Tramadol Nausea Only   Aspirin Rash        Medication List     STOP taking these medications    simvastatin 20 MG tablet Commonly known as: ZOCOR       TAKE these medications    acetaminophen 650 MG CR tablet Commonly known as: TYLENOL Take 650 mg by mouth every 8 (eight) hours as needed for pain.   albuterol 108 (90 Base) MCG/ACT inhaler Commonly known as: Ventolin HFA Inhale  2 puffs into the lungs every 4 (four) hours as needed for wheezing or shortness of breath (coughing fits).   amLODipine 5 MG tablet Commonly known as: NORVASC Take 1 tablet (5 mg total) by mouth daily.   atorvastatin 20 MG tablet Commonly known as: LIPITOR Take 1 tablet (20 mg total) by mouth daily.   benzonatate 100 MG capsule Commonly known as: TESSALON Take 1 capsule (100 mg total) by mouth 2 (two) times daily as needed for cough.   carvedilol 3.125 MG tablet Commonly known as: COREG Take 2 tablets (6.25 mg total) by mouth daily with breakfast AND 1 tablet (3.125 mg total) every evening.   cetirizine 10 MG tablet Commonly known as: ZYRTEC Take 10 mg by mouth as needed for allergies.   Debrox 6.5 % OTIC solution Generic drug: carbamide peroxide Place 5 drops into the right ear 2 (two) times daily.   EPINEPHrine 0.3 mg/0.3  mL Soaj injection Commonly known as: EpiPen 2-Pak Inject 0.3 mg into the muscle as needed. Inject 0.3mg  intramuscular route once as needed.   fluticasone 50 MCG/ACT nasal spray Commonly known as: FLONASE SHAKE LIQUID AND USE 1 SPRAY IN EACH NOSTRIL TWICE DAILY AS NEEDED FOR NASAL CONGESTION   meloxicam 7.5 MG tablet Commonly known as: MOBIC Take 1 tablet (7.5 mg total) by mouth daily as needed for pain. May increase to two daily if ineffective and notify provider   mometasone 0.1 % cream Commonly known as: ELOCON Apply 1 Application topically daily as needed.   montelukast 10 MG tablet Commonly known as: SINGULAIR Take 1 tablet (10 mg total) by mouth at bedtime.   MUCINEX DM PO Take 2 capsules by mouth as needed.   olmesartan 40 MG tablet Commonly known as: BENICAR TAKE 1 TABLET(40 MG) BY MOUTH DAILY   Spiriva Respimat 1.25 MCG/ACT Aers Generic drug: Tiotropium Bromide Monohydrate Inhale 2 puffs into the lungs daily.        Follow-up Information     Ngetich, [name] C, NP. Schedule an appointment as soon as possible for a visit in 1  week(s).   Specialty: Family Medicine Contact information: [address] [place] [address] [phone]                Allergies  Allergen Reactions   Codeine Nausea Only    unknown   Tramadol Nausea Only   Aspirin Rash       Procedures/Studies: MR BRAIN W WO CONTRAST  Result Date: 10/17/2022 CLINICAL DATA:  87 year old female with headache, hypertension. Indeterminate right frontal bone lesion on head CT yesterday. EXAM: MRI HEAD WITHOUT AND WITH CONTRAST TECHNIQUE: Multiplanar, multiecho pulse sequences of the brain and surrounding structures were obtained without and with intravenous contrast. CONTRAST:  7mL GADAVIST GADOBUTROL 1 MMOL/ML IV SOLN COMPARISON:  Head CT yesterday. FINDINGS: Brain: Cerebral volume is within normal limits for age. No restricted diffusion to suggest acute infarction. No midline shift, mass effect, evidence of mass lesion, ventriculomegaly, extra-axial collection or acute intracranial hemorrhage. Cervicomedullary junction and pituitary are within normal limits. Largely normal for age gray and white matter signal. Mild, also vague nonspecific periventricular white matter T2 and FLAIR hyperintensity. No cortical encephalomalacia. No convincing chronic cerebral blood products on SWI. Deep gray nuclei, brainstem and cerebellum appear negative. No abnormal gray or white matter enhancement. And no dural thickening, including underlying a right frontal bone lesion further described below (series 14, image 26). Vascular: Major intracranial vascular flow voids are preserved. Following contrast the major dural venous sinuses are enhancing and appear to be patent. Skull and upper cervical spine: Background bone marrow signal is within normal limits. There is a mildly expansive, T1 hypointense, T2 hyperintense and stippled (series 5, image 21) expansile lesion of the right frontal bone corresponding to the CT finding. This enhances intensely following contrast (series  13, image 42) and encompasses 43 by 12 x 57 mm (AP by transverse by CC). This does have heterogeneous diffusion signal. But also has fairly circumscribed margins with the adjacent bone. No overlying abnormality of the scalp soft tissue. No similar bone lesion is identified elsewhere in the head. Sinuses/Orbits: Postoperative changes to both globes. Otherwise negative. Other: Mastoids are clear. Visible internal auditory structures appear normal. IMPRESSION: 1. Right frontal bone mildly expansile lesion corresponding to the CT finding is highly vascular, up to 5.7 cm long axis. But no associated dural thickening or other aggressive features. And otherwise  visible bone marrow signal is normal. This is indeterminate but assuming no known malignancy then a benign vascular lesion of bone (especially hemangioma) is favored. Surveillance with noncontrast Head CT recommended to document stability (such as initial follow-up in 3 months unless clinically indicated sooner). 2. No superimposed acute intracranial abnormality with largely normal for age MRI appearance of the brain. Electronically Signed   By: Odessa Fleming M.D.   On: 10/17/2022 09:39   CT Angio Chest Pulmonary Embolism (PE) W or WO Contrast  Result Date: 10/17/2022 CLINICAL DATA:  Positive D-dimer with suspected pulmonary embolism. History of CHF with 50-55% ejection fraction, previous Graves disease status post thyroidectomy. Shortness of breath on presentation. EXAM: CT ANGIOGRAPHY CHEST WITH CONTRAST TECHNIQUE: Multidetector CT imaging of the chest was performed using the standard protocol during bolus administration of intravenous contrast. Multiplanar CT image reconstructions and MIPs were obtained to evaluate the vascular anatomy. RADIATION DOSE REDUCTION: This exam was performed according to the departmental dose-optimization program which includes automated exposure control, adjustment of the mA and/or kV according to patient size and/or use of iterative  reconstruction technique. CONTRAST:  75mL OMNIPAQUE IOHEXOL 350 MG/ML SOLN COMPARISON:  Limited comparison available with CTA abdomen and pelvis with contrast 07/20/2010. Portable chest today, PA Lat chest 01/11/2018. FINDINGS: Cardiovascular: There is mild panchamber cardiomegaly. No findings of acute right heart strain. There are scattered calcific plaques in the aorta and great vessels. Calcifications are scattered across the aortic valve leaflets. There is no aortic aneurysm, stenosis or dissection. The great vessels are widely patent with minimal scattered calcifications. Pulmonary veins are nondistended. The pulmonary trunk is prominent measuring 3 cm indicating arterial hypertension but borderline prominence of both main arteries. There is no evidence of arterial embolus. There are patchy three-vessel coronary artery calcifications. There is no pericardial effusion. Mediastinum/Nodes: Mildly enlarged heterogeneous thyroid with scattered dystrophic calcifications and nodules. Largest focal nodule is in the right lobe measuring 1.6 cm. Nonemergent ultrasound follow-up is recommended. Axillary spaces are clear. There are calcified lymph nodes in the subcarinal and precarinal mediastinum and right paratracheal space. A few slightly enlarged lymph nodes are noted with a right hilar lymph node of 1.3 cm in short axis on 5:60, precarinal lymph node 1.2 cm in short axis on 5:54, and arch left periaortic lymph node measuring 1.1 cm on 5:48. There is no bulky, encasing or further adenopathy. Small hiatal hernia. Unremarkable thoracic esophagus. No filling defect in thoracic trachea and main bronchi. Lungs/Pleura: There are mild centrilobular and paraseptal emphysematous changes in the upper lobes. Scattered areas of subpleural reticulation in the lung bases are similar to twelve years ago, with mild bilateral lower lobe medial basal traction bronchiectasis and paraspinal scarring increased somewhat in the interval. No  bronchial plugging is seen or evidence of focal pneumonia. There are scattered linear scar-like opacities. There is a 4 mm right upper lobe pleural-based nodule anteriorly on 6:33, and a 3 mm nodule laterally in the base of the right upper lobe on 6:56. There are no further nodules. No pleural effusion, thickening or pneumothorax. Upper Abdomen: No acute abnormality. Abdominal aortic atherosclerosis. Musculoskeletal: Mild reverse S shaped thoracic scoliosis, spondylosis and degenerative disc disease multiple levels. No acute or suspicious osseous findings are seen. There is no mass in the visualized chest wall. Review of the MIP images confirms the above findings. IMPRESSION: 1. No acute chest CT or CTA findings. 2. Cardiomegaly with aortic and coronary artery atherosclerosis. 3. Prominent pulmonary trunk indicating arterial hypertension but no evidence  of arterial embolus. 4. Chronic changes in the lung bases. 5. Two tiny nodules in the right upper lobe, largest 4 mm. No follow-up needed if patient is low-risk (and has no known or suspected primary neoplasm). Non-contrast chest CT can be considered in 12 months if patient is high-risk. This recommendation follows the consensus statement: Guidelines for Management of Incidental Pulmonary Nodules Detected on CT Images: From the Fleischner Society 2017; Radiology 2017; 284:228-243. 6. Few mildly enlarged mediastinal and right hilar lymph nodes. 7. Mildly enlarged heterogeneous thyroid with nodules, largest 1.6 cm. Nonemergent ultrasound follow-up is recommended. 8. Small hiatal hernia. 9. Emphysema. Emphysema (ICD10-J43.9). Electronically Signed   By: Almira Bar M.D.   On: 10/17/2022 01:58   CT Head Wo Contrast  Result Date: 10/16/2022 CLINICAL DATA:  Headache and hypertension. EXAM: CT HEAD WITHOUT CONTRAST TECHNIQUE: Contiguous axial images were obtained from the base of the skull through the vertex without intravenous contrast. RADIATION DOSE REDUCTION: This  exam was performed according to the departmental dose-optimization program which includes automated exposure control, adjustment of the mA and/or kV according to patient size and/or use of iterative reconstruction technique. COMPARISON:  None Available. FINDINGS: Brain: There is mild cerebral atrophy with widening of the extra-axial spaces and ventricular dilatation. There are areas of decreased attenuation within the white matter tracts of the supratentorial brain, consistent with microvascular disease changes. Vascular: No hyperdense vessel or unexpected calcification. Skull: A 3.2 cm x 1.2 cm x 1.4 cm mildly expansile, mixed lytic and sclerotic area is seen within the right frontal bone. Sinuses/Orbits: No acute finding. Other: None. IMPRESSION: 1. Generalized cerebral atrophy with chronic white matter small vessel ischemic changes. 2. No acute intracranial abnormality. 3. Mildly expansile, mixed lytic and sclerotic area within the right frontal bone. Correlation with nonemergent follow-up MRI is recommended. Electronically Signed   By: Aram Candela M.D.   On: 10/16/2022 22:39   DG Chest Port 1 View  Result Date: 10/16/2022 CLINICAL DATA:  Shortness of breath EXAM: PORTABLE CHEST 1 VIEW COMPARISON:  01/11/2018 FINDINGS: Mild cardiomegaly. No acute airspace disease or effusion. Aortic atherosclerosis. No pneumothorax. Scoliosis of the spine. Mildly prominent hilar vessels, possible arterial hypertension IMPRESSION: Mild cardiomegaly. Electronically Signed   By: Jasmine Pang M.D.   On: 10/16/2022 20:32       Discharge Exam: Vitals:   10/17/22 0455 10/17/22 0537  BP: (!) 182/90 (!) 142/76  Pulse:  81  Resp:    Temp:    SpO2:  93%    General: Pt is alert, awake, not in acute distress Cardiovascular: RRR, S1/S2 +, no edema Respiratory: CTA bilaterally, no wheezing, no rhonchi, no respiratory distress, no conversational dyspnea  Abdominal: Soft, NT, ND, bowel sounds + Extremities: no edema,  no cyanosis Psych: Normal mood and affect, stable judgement and insight     The results of significant diagnostics from this hospitalization (including imaging, microbiology, ancillary and laboratory) are listed below for reference.     Microbiology: Recent Results (from the past 240 hour(s))  Resp panel by RT-PCR (RSV, Flu A&B, Covid) Anterior Nasal Swab     Status: None   Collection Time: 10/16/22  8:52 PM   Specimen: Anterior Nasal Swab  Result Value Ref Range Status   SARS Coronavirus 2 by RT PCR NEGATIVE NEGATIVE Final   Influenza A by PCR NEGATIVE NEGATIVE Final   Influenza B by PCR NEGATIVE NEGATIVE Final    Comment: (NOTE) The Xpert Xpress SARS-CoV-2/FLU/RSV plus assay is intended as an aid  in the diagnosis of influenza from Nasopharyngeal swab specimens and should not be used as a sole basis for treatment. Nasal washings and aspirates are unacceptable for Xpert Xpress SARS-CoV-2/FLU/RSV testing.  Fact Sheet for Patients: BloggerCourse.com  Fact Sheet for Healthcare Providers: SeriousBroker.it  This test is not yet approved or cleared by the [place] FDA and has been authorized for detection and/or diagnosis of SARS-CoV-2 by FDA under an Emergency Use Authorization (EUA). This EUA will remain in effect (meaning this test can be used) for the duration of the COVID-19 declaration under Section 564(b)(1) of the Act, 21 U.S.C. section 360bbb-3(b)(1), unless the authorization is terminated or revoked.     Resp Syncytial Virus by PCR NEGATIVE NEGATIVE Final    Comment: (NOTE) Fact Sheet for Patients: BloggerCourse.com  Fact Sheet for Healthcare Providers: SeriousBroker.it  This test is not yet approved or cleared by the [place] FDA and has been authorized for detection and/or diagnosis of SARS-CoV-2 by FDA under an Emergency Use Authorization (EUA). This EUA  will remain in effect (meaning this test can be used) for the duration of the COVID-19 declaration under Section 564(b)(1) of the Act, 21 U.S.C. section 360bbb-3(b)(1), unless the authorization is terminated or revoked.  Performed at [facility], [address] [place], [place] [address]      Labs: BNP (last 3 results) Recent Labs    10/16/22 1952  BNP 126.5*   Basic Metabolic Panel: Recent Labs  Lab 10/16/22 1952 10/17/22 0424  NA 144 138  K 4.1 3.6  CL 102 99  CO2 26 22  GLUCOSE 128* 190*  BUN 13 12  CREATININE 0.77 0.87  CALCIUM 9.7 8.8*  MG  --  1.8   Liver Function Tests: Recent Labs  Lab 10/16/22 1952  AST 20  ALT 16  ALKPHOS 111  BILITOT 0.5  PROT 8.8*  ALBUMIN 4.0   No results for input(s): "LIPASE", "AMYLASE" in the last 168 hours. No results for input(s): "AMMONIA" in the last 168 hours. CBC: Recent Labs  Lab 10/16/22 1952 10/17/22 0424  WBC 8.7 4.0  HGB 14.8 15.1*  HCT 46.8* 46.3*  MCV 92.7 90.1  PLT 228 218   Cardiac Enzymes: No results for input(s): "CKTOTAL", "CKMB", "CKMBINDEX", "TROPONINI" in the last 168 hours. BNP: Invalid input(s): "POCBNP" CBG: No results for input(s): "GLUCAP" in the last 168 hours. D-Dimer Recent Labs    10/16/22 1952  DDIMER 0.63*   Hgb A1c No results for input(s): "HGBA1C" in the last 72 hours. Lipid Profile No results for input(s): "CHOL", "HDL", "LDLCALC", "TRIG", "CHOLHDL", "LDLDIRECT" in the last 72 hours. Thyroid function studies No results for input(s): "TSH", "T4TOTAL", "T3FREE", "THYROIDAB" in the last 72 hours.  Invalid input(s): "FREET3" Anemia work up No results for input(s): "VITAMINB12", "FOLATE", "FERRITIN", "TIBC", "IRON", "RETICCTPCT" in the last 72 hours. Urinalysis    Component Value Date/Time   BILIRUBINUR Negative 08/15/2021 1015   PROTEINUR Negative 08/15/2021 1015   UROBILINOGEN negative (A) 08/15/2021 1015   NITRITE Negative 08/15/2021 1015   LEUKOCYTESUR Large  (3+) (A) 08/15/2021 1015   Sepsis Labs Recent Labs  Lab 10/16/22 1952 10/17/22 0424  WBC 8.7 4.0   Microbiology Recent Results (from the past 240 hour(s))  Resp panel by RT-PCR (RSV, Flu A&B, Covid) Anterior Nasal Swab     Status: None   Collection Time: 10/16/22  8:52 PM   Specimen: Anterior Nasal Swab  Result Value Ref Range Status   SARS Coronavirus 2 by RT  PCR NEGATIVE NEGATIVE Final   Influenza A by PCR NEGATIVE NEGATIVE Final   Influenza B by PCR NEGATIVE NEGATIVE Final    Comment: (NOTE) The Xpert Xpress SARS-CoV-2/FLU/RSV plus assay is intended as an aid in the diagnosis of influenza from Nasopharyngeal swab specimens and should not be used as a sole basis for treatment. Nasal washings and aspirates are unacceptable for Xpert Xpress SARS-CoV-2/FLU/RSV testing.  Fact Sheet for Patients: BloggerCourse.com  Fact Sheet for Healthcare Providers: SeriousBroker.it  This test is not yet approved or cleared by the [place] FDA and has been authorized for detection and/or diagnosis of SARS-CoV-2 by FDA under an Emergency Use Authorization (EUA). This EUA will remain in effect (meaning this test can be used) for the duration of the COVID-19 declaration under Section 564(b)(1) of the Act, 21 U.S.C. section 360bbb-3(b)(1), unless the authorization is terminated or revoked.     Resp Syncytial Virus by PCR NEGATIVE NEGATIVE Final    Comment: (NOTE) Fact Sheet for Patients: BloggerCourse.com  Fact Sheet for Healthcare Providers: SeriousBroker.it  This test is not yet approved or cleared by the [place] FDA and has been authorized for detection and/or diagnosis of SARS-CoV-2 by FDA under an Emergency Use Authorization (EUA). This EUA will remain in effect (meaning this test can be used) for the duration of the COVID-19 declaration under Section 564(b)(1) of the Act,  21 U.S.C. section 360bbb-3(b)(1), unless the authorization is terminated or revoked.  Performed at [facility], [address] [place], [place] [address]      Patient was seen and examined on the day of discharge and was found to be in stable condition. Time coordinating discharge: 40 minutes including assessment and coordination of care, as well as examination of the patient.   SIGNED:  Noralee Stain, DO [facility] 10/17/2022, 11:40 AM

## 2022-10-17 NOTE — ED Notes (Signed)
[facility] called they are ready for this pt

## 2022-10-17 NOTE — Care Management CC44 (Signed)
Condition Code 44 Documentation Completed  Patient Details  Name: Ashley Johns MRN: [id number] Date of Birth: 1935-12-03   Condition Code 44 given:  Yes Patient signature on Condition Code 44 notice:  Yes Documentation of 2 MD's agreement:  Yes Code 44 added to claim:  Yes    Ronny Bacon, RN 10/17/2022, 11:51 AM

## 2022-10-17 NOTE — ED Notes (Signed)
ED TO INPATIENT HANDOFF REPORT  ED Nurse Name and Phone #: [phone]  S Name/Age/Gender Ashley Johns 87 y.o. female Room/Bed: 006C/006C  Code Status   Code Status: Full Code  Home/SNF/Other Home Patient oriented to: self, place, time, and situation Is this baseline? Yes   Triage Complete: Triage complete  Chief Complaint Acute respiratory failure with hypoxia (HCC) [J96.01]  Triage Note No notes on file   Allergies Allergies  Allergen Reactions   Codeine Nausea Only    unknown   Tramadol Nausea Only   Aspirin Rash    Level of Care/Admitting Diagnosis ED Disposition     ED Disposition  Admit   Condition  --   Comment  Hospital Area: [facility] [[id number]]  Level of Care: Progressive [102]  Admit to Progressive based on following criteria: RESPIRATORY PROBLEMS hypoxemic/hypercapnic respiratory failure that is responsive to NIPPV (BiPAP) or High Flow Nasal Cannula (6-80 lpm). Frequent assessment/intervention, no > Q2 hrs < Q4 hrs, to maintain oxygenation and pulmonary hygiene.  May admit patient to [facility] or [facility] if equivalent level of care is available:: Yes  Covid Evaluation: Confirmed COVID Negative  Diagnosis: Acute respiratory failure with hypoxia [facility]) [[id number]]  Admitting Physician: Briscoe Deutscher [[id number]]  Attending Physician: Briscoe Deutscher [[id number]]  Certification:: I certify this patient will need inpatient services for at least 2 midnights  Expected Medical Readiness: 10/18/2022          B Medical/Surgery History Past Medical History:  Diagnosis Date   Eczema    Per [facility] new patient packet   Graves disease    Per [facility] new patient packet   High blood pressure    Per [facility] new patient packet   Hypertension    Past Surgical History:  Procedure Laterality Date   TOTAL ABDOMINAL HYSTERECTOMY Bilateral 1970's   TUBAL LIGATION       A IV Location/Drains/Wounds Patient Lines/Drains/Airways Status     Active  Line/Drains/Airways     Name Placement date Placement time Site Days   Peripheral IV 10/16/22 20 G Right Antecubital 10/16/22  1938  Antecubital  1            Intake/Output Last 24 hours No intake or output data in the 24 hours ending 10/17/22 0226  Labs/Imaging Results for orders placed or performed during the hospital encounter of 10/16/22 (from the past 48 hour(s))  Comprehensive metabolic panel     Status: Abnormal   Collection Time: 10/16/22  7:52 PM  Result Value Ref Range   Sodium 144 135 - 145 mmol/L   Potassium 4.1 3.5 - 5.1 mmol/L   Chloride 102 98 - 111 mmol/L   CO2 26 22 - 32 mmol/L   Glucose, Bld 128 (H) 70 - 99 mg/dL    Comment: Glucose reference range applies only to samples taken after fasting for at least 8 hours.   BUN 13 8 - 23 mg/dL   Creatinine, Ser 6.57 0.44 - 1.00 mg/dL   Calcium 9.7 8.9 - 84.6 mg/dL   Total Protein 8.8 (H) 6.5 - 8.1 g/dL   Albumin 4.0 3.5 - 5.0 g/dL   AST 20 15 - 41 U/L   ALT 16 0 - 44 U/L   Alkaline Phosphatase 111 38 - 126 U/L   Total Bilirubin 0.5 0.3 - 1.2 mg/dL   GFR, Estimated >96 >29 mL/min    Comment: (NOTE) Calculated using the CKD-EPI Creatinine Equation (2021)    Anion gap 16 (H)  5 - 15    Comment: Performed at [facility], [address], [place], [place] [address]  CBC     Status: Abnormal   Collection Time: 10/16/22  7:52 PM  Result Value Ref Range   WBC 8.7 4.0 - 10.5 K/uL   RBC 5.05 3.87 - 5.11 MIL/uL   Hemoglobin 14.8 12.0 - 15.0 g/dL   HCT 40.3 (H) 47.4 - 25.9 %   MCV 92.7 80.0 - 100.0 fL   MCH 29.3 26.0 - 34.0 pg   MCHC 31.6 30.0 - 36.0 g/dL   RDW 56.3 87.5 - 64.3 %   Platelets 228 150 - 400 K/uL   nRBC 0.0 0.0 - 0.2 %    Comment: Performed at [facility], [address] [place], [place] [address]  Brain natriuretic peptide     Status: Abnormal   Collection Time: 10/16/22  7:52 PM  Result Value Ref Range   B Natriuretic Peptide 126.5 (H) 0.0 - 100.0 pg/mL    Comment: Performed at [facility], [address], [place], [place] [address]  Troponin I (High Sensitivity)     Status: None   Collection Time: 10/16/22  7:52 PM  Result Value Ref Range   Troponin I (High Sensitivity) 16 <18 ng/L    Comment: (NOTE) Elevated high sensitivity troponin I (hsTnI) values and significant  changes across serial measurements may suggest ACS but many other  chronic and acute conditions are known to elevate hsTnI results.  Refer to the "Links" section for chest pain algorithms and additional  guidance. Performed at [facility], [address] [place], [place] [address]   D-dimer, quantitative     Status: Abnormal   Collection Time: 10/16/22  7:52 PM  Result Value Ref Range   D-Dimer, Quant 0.63 (H) 0.00 - 0.50 ug/mL-FEU    Comment: (NOTE) At the manufacturer cut-off value of 0.5 g/mL FEU, this assay has a negative predictive value of 95-100%.This assay is intended for use in conjunction with a clinical pretest probability (PTP) assessment model to exclude pulmonary embolism (PE) and deep venous thrombosis (DVT) in outpatients suspected of PE or DVT. Results should be correlated with clinical presentation. Performed at [facility], [address] [place], [place] [address]   Blood gas, venous     Status: Abnormal   Collection Time: 10/16/22  7:52 PM  Result Value Ref Range   pH, Ven 7.39 7.25 - 7.43   pCO2, Ven 49 44 - 60 mmHg   pO2, Ven 37 32 - 45 mmHg   Bicarbonate 29.7 (H) 20.0 - 28.0 mmol/L   Acid-Base Excess 3.7 (H) 0.0 - 2.0 mmol/L   O2 Saturation 64.6 %   Patient temperature 37.0     Comment: Performed at [facility], [address] [place], [place] [address]  Resp panel by RT-PCR (RSV, Flu A&B, Covid) Anterior Nasal Swab     Status: None   Collection Time: 10/16/22  8:52 PM   Specimen: Anterior Nasal Swab  Result Value Ref Range   SARS Coronavirus 2 by RT PCR NEGATIVE NEGATIVE   Influenza A by PCR NEGATIVE NEGATIVE   Influenza B by PCR  NEGATIVE NEGATIVE    Comment: (NOTE) The Xpert Xpress SARS-CoV-2/FLU/RSV plus assay is intended as an aid in the diagnosis of influenza from Nasopharyngeal swab specimens and should not be used as a sole basis for treatment. Nasal washings and aspirates are unacceptable for  Xpert Xpress SARS-CoV-2/FLU/RSV testing.  Fact Sheet for Patients: BloggerCourse.com  Fact Sheet for Healthcare Providers: SeriousBroker.it  This test is not yet approved or cleared by the [place] FDA and has been authorized for detection and/or diagnosis of SARS-CoV-2 by FDA under an Emergency Use Authorization (EUA). This EUA will remain in effect (meaning this test can be used) for the duration of the COVID-19 declaration under Section 564(b)(1) of the Act, 21 U.S.C. section 360bbb-3(b)(1), unless the authorization is terminated or revoked.     Resp Syncytial Virus by PCR NEGATIVE NEGATIVE    Comment: (NOTE) Fact Sheet for Patients: BloggerCourse.com  Fact Sheet for Healthcare Providers: SeriousBroker.it  This test is not yet approved or cleared by the [place] FDA and has been authorized for detection and/or diagnosis of SARS-CoV-2 by FDA under an Emergency Use Authorization (EUA). This EUA will remain in effect (meaning this test can be used) for the duration of the COVID-19 declaration under Section 564(b)(1) of the Act, 21 U.S.C. section 360bbb-3(b)(1), unless the authorization is terminated or revoked.  Performed at [facility], [address] [place], [place] [address]   Troponin I (High Sensitivity)     Status: Abnormal   Collection Time: 10/16/22  9:41 PM  Result Value Ref Range   Troponin I (High Sensitivity) 22 (H) <18 ng/L    Comment: (NOTE) Elevated high sensitivity troponin I (hsTnI) values and significant  changes across serial measurements may suggest ACS but many  other  chronic and acute conditions are known to elevate hsTnI results.  Refer to the "Links" section for chest pain algorithms and additional  guidance. Performed at [facility], [address], [place], [place] [address]    CT Angio Chest Pulmonary Embolism (PE) W or WO Contrast  Result Date: 10/17/2022 CLINICAL DATA:  Positive D-dimer with suspected pulmonary embolism. History of CHF with 50-55% ejection fraction, previous Graves disease status post thyroidectomy. Shortness of breath on presentation. EXAM: CT ANGIOGRAPHY CHEST WITH CONTRAST TECHNIQUE: Multidetector CT imaging of the chest was performed using the standard protocol during bolus administration of intravenous contrast. Multiplanar CT image reconstructions and MIPs were obtained to evaluate the vascular anatomy. RADIATION DOSE REDUCTION: This exam was performed according to the departmental dose-optimization program which includes automated exposure control, adjustment of the mA and/or kV according to patient size and/or use of iterative reconstruction technique. CONTRAST:  75mL OMNIPAQUE IOHEXOL 350 MG/ML SOLN COMPARISON:  Limited comparison available with CTA abdomen and pelvis with contrast 07/20/2010. Portable chest today, PA Lat chest 01/11/2018. FINDINGS: Cardiovascular: There is mild panchamber cardiomegaly. No findings of acute right heart strain. There are scattered calcific plaques in the aorta and great vessels. Calcifications are scattered across the aortic valve leaflets. There is no aortic aneurysm, stenosis or dissection. The great vessels are widely patent with minimal scattered calcifications. Pulmonary veins are nondistended. The pulmonary trunk is prominent measuring 3 cm indicating arterial hypertension but borderline prominence of both main arteries. There is no evidence of arterial embolus. There are patchy three-vessel coronary artery calcifications. There is no pericardial effusion. Mediastinum/Nodes: Mildly  enlarged heterogeneous thyroid with scattered dystrophic calcifications and nodules. Largest focal nodule is in the right lobe measuring 1.6 cm. Nonemergent ultrasound follow-up is recommended. Axillary spaces are clear. There are calcified lymph nodes in the subcarinal and precarinal mediastinum and right paratracheal space. A few slightly enlarged lymph nodes are noted with a right hilar lymph node of 1.3 cm in short axis on 5:60, precarinal  lymph node 1.2 cm in short axis on 5:54, and arch left periaortic lymph node measuring 1.1 cm on 5:48. There is no bulky, encasing or further adenopathy. Small hiatal hernia. Unremarkable thoracic esophagus. No filling defect in thoracic trachea and main bronchi. Lungs/Pleura: There are mild centrilobular and paraseptal emphysematous changes in the upper lobes. Scattered areas of subpleural reticulation in the lung bases are similar to twelve years ago, with mild bilateral lower lobe medial basal traction bronchiectasis and paraspinal scarring increased somewhat in the interval. No bronchial plugging is seen or evidence of focal pneumonia. There are scattered linear scar-like opacities. There is a 4 mm right upper lobe pleural-based nodule anteriorly on 6:33, and a 3 mm nodule laterally in the base of the right upper lobe on 6:56. There are no further nodules. No pleural effusion, thickening or pneumothorax. Upper Abdomen: No acute abnormality. Abdominal aortic atherosclerosis. Musculoskeletal: Mild reverse S shaped thoracic scoliosis, spondylosis and degenerative disc disease multiple levels. No acute or suspicious osseous findings are seen. There is no mass in the visualized chest wall. Review of the MIP images confirms the above findings. IMPRESSION: 1. No acute chest CT or CTA findings. 2. Cardiomegaly with aortic and coronary artery atherosclerosis. 3. Prominent pulmonary trunk indicating arterial hypertension but no evidence of arterial embolus. 4. Chronic changes in the  lung bases. 5. Two tiny nodules in the right upper lobe, largest 4 mm. No follow-up needed if patient is low-risk (and has no known or suspected primary neoplasm). Non-contrast chest CT can be considered in 12 months if patient is high-risk. This recommendation follows the consensus statement: Guidelines for Management of Incidental Pulmonary Nodules Detected on CT Images: From the Fleischner Society 2017; Radiology 2017; 284:228-243. 6. Few mildly enlarged mediastinal and right hilar lymph nodes. 7. Mildly enlarged heterogeneous thyroid with nodules, largest 1.6 cm. Nonemergent ultrasound follow-up is recommended. 8. Small hiatal hernia. 9. Emphysema. Emphysema (ICD10-J43.9). Electronically Signed   By: Almira Bar M.D.   On: 10/17/2022 01:58   CT Head Wo Contrast  Result Date: 10/16/2022 CLINICAL DATA:  Headache and hypertension. EXAM: CT HEAD WITHOUT CONTRAST TECHNIQUE: Contiguous axial images were obtained from the base of the skull through the vertex without intravenous contrast. RADIATION DOSE REDUCTION: This exam was performed according to the departmental dose-optimization program which includes automated exposure control, adjustment of the mA and/or kV according to patient size and/or use of iterative reconstruction technique. COMPARISON:  None Available. FINDINGS: Brain: There is mild cerebral atrophy with widening of the extra-axial spaces and ventricular dilatation. There are areas of decreased attenuation within the white matter tracts of the supratentorial brain, consistent with microvascular disease changes. Vascular: No hyperdense vessel or unexpected calcification. Skull: A 3.2 cm x 1.2 cm x 1.4 cm mildly expansile, mixed lytic and sclerotic area is seen within the right frontal bone. Sinuses/Orbits: No acute finding. Other: None. IMPRESSION: 1. Generalized cerebral atrophy with chronic white matter small vessel ischemic changes. 2. No acute intracranial abnormality. 3. Mildly expansile, mixed  lytic and sclerotic area within the right frontal bone. Correlation with nonemergent follow-up MRI is recommended. Electronically Signed   By: Aram Candela M.D.   On: 10/16/2022 22:39   DG Chest Port 1 View  Result Date: 10/16/2022 CLINICAL DATA:  Shortness of breath EXAM: PORTABLE CHEST 1 VIEW COMPARISON:  01/11/2018 FINDINGS: Mild cardiomegaly. No acute airspace disease or effusion. Aortic atherosclerosis. No pneumothorax. Scoliosis of the spine. Mildly prominent hilar vessels, possible arterial hypertension IMPRESSION: Mild cardiomegaly. Electronically Signed  By: Jasmine Pang M.D.   On: 10/16/2022 20:32    Pending Labs Unresulted Labs (From admission, onward)     Start     Ordered   10/24/22 0500  Creatinine, serum  (enoxaparin (LOVENOX)    CrCl >/= 30 ml/min)  Weekly,   R     Comments: while on enoxaparin therapy    10/17/22 0016   10/17/22 0500  Basic metabolic panel  Daily,   R      10/17/22 0016   10/17/22 0500  Magnesium  Tomorrow morning,   R        10/17/22 0016   10/17/22 0500  CBC  Daily,   R      10/17/22 0016            Vitals/Pain Today's Vitals   10/17/22 0015 10/17/22 0045 10/17/22 0115 10/17/22 0225  BP: (!) 185/90 134/76 (!) 141/76   Pulse: 88 87 81   Resp: (!) 27 (!) 23 (!) 21   Temp:    97.7 F (36.5 C)  TempSrc:      SpO2: 96% 94% 94%   PainSc:        Isolation Precautions No active isolations  Medications Medications  carvedilol (COREG) tablet 6.25 mg (has no administration in time range)    And  carvedilol (COREG) tablet 3.125 mg (has no administration in time range)  irbesartan (AVAPRO) tablet 300 mg (has no administration in time range)  simvastatin (ZOCOR) tablet 20 mg (has no administration in time range)  enoxaparin (LOVENOX) injection 40 mg (has no administration in time range)  sodium chloride flush (NS) 0.9 % injection 3 mL (has no administration in time range)  acetaminophen (TYLENOL) tablet 650 mg (has no administration in  time range)    Or  acetaminophen (TYLENOL) suppository 650 mg (has no administration in time range)  oxyCODONE (Oxy IR/ROXICODONE) immediate release tablet 5 mg (has no administration in time range)  senna-docusate (Senokot-S) tablet 1 tablet (has no administration in time range)  ondansetron (ZOFRAN) tablet 4 mg (has no administration in time range)    Or  ondansetron (ZOFRAN) injection 4 mg (has no administration in time range)  predniSONE (DELTASONE) tablet 40 mg (has no administration in time range)  albuterol (PROVENTIL) (2.5 MG/3ML) 0.083% nebulizer solution 2.5 mg (has no administration in time range)  labetalol (NORMODYNE) injection 10 mg (has no administration in time range)  hydrALAZINE (APRESOLINE) tablet 25 mg (25 mg Oral Given 10/16/22 2000)  ipratropium-albuterol (DUONEB) 0.5-2.5 (3) MG/3ML nebulizer solution 3 mL (3 mLs Nebulization Given 10/16/22 1951)  methylPREDNISolone sodium succinate (SOLU-MEDROL) 125 mg/2 mL injection 125 mg (125 mg Intravenous Given 10/16/22 2031)  iohexol (OMNIPAQUE) 350 MG/ML injection 75 mL (75 mLs Intravenous Contrast Given 10/17/22 0134)    Mobility walks with device     Focused Assessments Cardiac Assessment Handoff:    No results found for: "CKTOTAL", "CKMB", "CKMBINDEX", "TROPONINI" Lab Results  Component Value Date   DDIMER 0.63 (H) 10/16/2022   Does the Patient currently have chest pain? No    R Recommendations: See Admitting Provider Note  Report given to:   Additional Notes:

## 2022-10-23 ENCOUNTER — Encounter: Payer: Self-pay | Admitting: Family

## 2022-10-23 ENCOUNTER — Ambulatory Visit (INDEPENDENT_AMBULATORY_CARE_PROVIDER_SITE_OTHER): Payer: Medicare Other | Admitting: Family

## 2022-10-23 VITALS — BP 110/80 | HR 74 | Temp 97.6°F | Resp 14 | Ht 60.0 in | Wt 166.0 lb

## 2022-10-23 DIAGNOSIS — E785 Hyperlipidemia, unspecified: Secondary | ICD-10-CM

## 2022-10-23 DIAGNOSIS — Z23 Encounter for immunization: Secondary | ICD-10-CM | POA: Diagnosis not present

## 2022-10-23 DIAGNOSIS — I4891 Unspecified atrial fibrillation: Secondary | ICD-10-CM

## 2022-10-23 DIAGNOSIS — I10 Essential (primary) hypertension: Secondary | ICD-10-CM

## 2022-10-23 DIAGNOSIS — G939 Disorder of brain, unspecified: Secondary | ICD-10-CM | POA: Diagnosis not present

## 2022-10-23 NOTE — Progress Notes (Unsigned)
Provider: Richarda Blade FNP-C  Mujahid Jalomo, Donalee Citrin, NP  Patient Care Team: Aayana Reinertsen, Donalee Citrin, NP as PCP - General (Family Medicine) Maisie Fus, MD as PCP - Cardiology (Cardiology) Aris Lot, MD as Consulting Physician (Dermatology)  Extended Emergency Contact Information Primary Emergency Contact: Terance [place] [employer] Phone: [phone] Relation: Daughter Secondary Emergency Contact: booker,melonie Mobile Phone: [phone] Relation: Granddaughter  Code Status: Full Code  Goals of care: Advanced Directive information    10/23/2022    9:33 AM  Advanced Directives  Does Patient Have a Medical Advance Directive? No     No chief complaint on file.   HPI:  Pt is a 87 y.o. female seen today for an acute visit for hospital follow up   Past Medical History:  Diagnosis Date   Eczema    Per [facility] new patient packet   Graves disease    Per [facility] new patient packet   High blood pressure    Per [facility] new patient packet   Hypertension    Past Surgical History:  Procedure Laterality Date   TOTAL ABDOMINAL HYSTERECTOMY Bilateral 1970's   TUBAL LIGATION      Allergies  Allergen Reactions   Codeine Nausea Only    unknown   Tramadol Nausea Only   Aspirin Rash    Outpatient Encounter Medications as of 10/23/2022  Medication Sig   acetaminophen (TYLENOL) 650 MG CR tablet Take 650 mg by mouth every 8 (eight) hours as needed for pain.   albuterol (VENTOLIN HFA) 108 (90 Base) MCG/ACT inhaler Inhale 2 puffs into the lungs every 4 (four) hours as needed for wheezing or shortness of breath (coughing fits).   amLODipine (NORVASC) 5 MG tablet Take 1 tablet (5 mg total) by mouth daily.   atorvastatin (LIPITOR) 20 MG tablet Take 1 tablet (20 mg total) by mouth daily.   benzonatate (TESSALON) 100 MG capsule Take 1 capsule (100 mg total) by mouth 2 (two) times daily as needed for cough.   carbamide peroxide (DEBROX) 6.5 % OTIC solution Place 5 drops  into the right ear 2 (two) times daily.   carvedilol (COREG) 3.125 MG tablet Take 2 tablets (6.25 mg total) by mouth daily with breakfast AND 1 tablet (3.125 mg total) every evening.   cetirizine (ZYRTEC) 10 MG tablet Take 10 mg by mouth as needed for allergies.   Dextromethorphan-guaiFENesin (MUCINEX DM PO) Take 2 capsules by mouth as needed.   EPINEPHrine (EPIPEN 2-PAK) 0.3 mg/0.3 mL IJ SOAJ injection Inject 0.3 mg into the muscle as needed. Inject 0.3mg  intramuscular route once as needed.   fluticasone (FLONASE) 50 MCG/ACT nasal spray SHAKE LIQUID AND USE 1 SPRAY IN EACH NOSTRIL TWICE DAILY AS NEEDED FOR NASAL CONGESTION   meloxicam (MOBIC) 7.5 MG tablet Take 1 tablet (7.5 mg total) by mouth daily as needed for pain. May increase to two daily if ineffective and notify provider   mometasone (ELOCON) 0.1 % cream Apply 1 Application topically daily as needed.   montelukast (SINGULAIR) 10 MG tablet Take 1 tablet (10 mg total) by mouth at bedtime.   olmesartan (BENICAR) 40 MG tablet TAKE 1 TABLET(40 MG) BY MOUTH DAILY   Tiotropium Bromide Monohydrate (SPIRIVA RESPIMAT) 1.25 MCG/ACT AERS Inhale 2 puffs into the lungs daily.   No facility-administered encounter medications on file as of 10/23/2022.    Review of Systems  Constitutional:  Negative for appetite change, chills, fatigue, fever and unexpected weight change.  HENT:  Negative for congestion, dental problem,  ear discharge, ear pain, facial swelling, hearing loss, nosebleeds, postnasal drip, rhinorrhea, sinus pressure, sinus pain, sneezing, sore throat, tinnitus and trouble swallowing.   Eyes:  Negative for pain, discharge, redness, itching and visual disturbance.  Respiratory:  Negative for cough, chest tightness, shortness of breath and wheezing.   Cardiovascular:  Negative for chest pain, palpitations and leg swelling.  Gastrointestinal:  Negative for abdominal distention, abdominal pain, blood in stool, constipation, diarrhea, nausea and  vomiting.  Endocrine: Negative for cold intolerance, heat intolerance, polydipsia, polyphagia and polyuria.  Genitourinary:  Negative for difficulty urinating, dysuria, flank pain, frequency and urgency.  Musculoskeletal:  Negative for arthralgias, back pain, gait problem, joint swelling, myalgias, neck pain and neck stiffness.  Skin:  Negative for color change, pallor, rash and wound.  Neurological:  Negative for dizziness, syncope, speech difficulty, weakness, light-headedness, numbness and headaches.  Hematological:  Does not bruise/bleed easily.  Psychiatric/Behavioral:  Negative for agitation, behavioral problems, confusion, hallucinations, self-injury, sleep disturbance and suicidal ideas. The patient is not nervous/anxious.     Immunization History  Administered Date(s) Administered   Fluad Quad(high Dose 65+) 10/11/2018   Influenza, High Dose Seasonal PF 12/03/2015, 11/07/2019   Influenza,inj,Quad PF,6+ Mos 10/11/2018   Influenza-Unspecified 11/16/2017, 11/27/2020   PFIZER Comirnaty(Gray Top)Covid-19 Tri-Sucrose Vaccine 07/31/2020   PFIZER(Purple Top)SARS-COV-2 Vaccination 02/18/2019, 03/11/2019, 11/29/2019, 07/31/2020   Pneumococcal Conjugate-13 10/11/2018   Td 10/11/2018   Tdap 10/11/2018   Pertinent  Health Maintenance Due  Topic Date Due   INFLUENZA VACCINE  09/10/2022   DEXA SCAN  Completed      08/22/2021    9:06 AM 08/26/2021    9:28 AM 02/24/2022    3:01 PM 07/03/2022    1:08 PM 08/17/2022    8:46 AM  Fall Risk  Falls in the past year? 0 0 0 0 0  Was there an injury with Fall? 0 0 0 0 0  Fall Risk Category Calculator 0 0 0 0 0  Fall Risk Category (Retired) Low Low     (RETIRED) Patient Fall Risk Level Low fall risk Low fall risk     Patient at Risk for Falls Due to No Fall Risks No Fall Risks No Fall Risks No Fall Risks No Fall Risks  Fall risk Follow up Falls evaluation completed Falls evaluation completed Falls evaluation completed Falls evaluation completed Falls  evaluation completed   Functional Status Survey:    Vitals:   10/23/22 0933  BP: 110/80  Pulse: 74  Resp: 14  Temp: 97.6 F (36.4 C)  Weight: 166 lb (75.3 kg)  Height: 5' (1.524 m)   Body mass index is 32.42 kg/m. Physical Exam Vitals reviewed.  Constitutional:      General: She is not in acute distress.    Appearance: Normal appearance. She is obese. She is not ill-appearing or diaphoretic.  HENT:     Head: Normocephalic.     Right Ear: Tympanic membrane, ear canal and external ear normal. There is no impacted cerumen.     Left Ear: Tympanic membrane, ear canal and external ear normal. There is no impacted cerumen.     Nose: Nose normal. No congestion or rhinorrhea.     Mouth/Throat:     Mouth: Mucous membranes are moist.     Pharynx: Oropharynx is clear. No oropharyngeal exudate or posterior oropharyngeal erythema.  Eyes:     General: No scleral icterus.       Right eye: No discharge.        Left eye:  No discharge.     Extraocular Movements: Extraocular movements intact.     Conjunctiva/sclera: Conjunctivae normal.     Pupils: Pupils are equal, round, and reactive to light.  Neck:     Vascular: No carotid bruit.  Cardiovascular:     Rate and Rhythm: Normal rate and regular rhythm.     Pulses: Normal pulses.     Heart sounds: Normal heart sounds. No murmur heard.    No friction rub. No gallop.  Pulmonary:     Effort: Pulmonary effort is normal. No respiratory distress.     Breath sounds: Normal breath sounds. No wheezing, rhonchi or rales.  Chest:     Chest wall: No tenderness.  Abdominal:     General: Bowel sounds are normal. There is no distension.     Palpations: Abdomen is soft. There is no mass.     Tenderness: There is no abdominal tenderness. There is no right CVA tenderness, left CVA tenderness, guarding or rebound.  Musculoskeletal:        General: No swelling or tenderness. Normal range of motion.     Cervical back: Normal range of motion. No rigidity  or tenderness.     Right lower leg: No edema.     Left lower leg: No edema.  Lymphadenopathy:     Cervical: No cervical adenopathy.  Skin:    General: Skin is warm and dry.     Coloration: Skin is not pale.     Findings: No bruising, erythema, lesion or rash.  Neurological:     Mental Status: She is alert and oriented to person, place, and time.     Cranial Nerves: No cranial nerve deficit.     Sensory: No sensory deficit.     Motor: No weakness.     Coordination: Coordination normal.     Gait: Gait normal.  Psychiatric:        Mood and Affect: Mood normal.        Speech: Speech normal.        Behavior: Behavior normal.        Thought Content: Thought content normal.        Judgment: Judgment normal.     Labs reviewed: Recent Labs    08/17/22 0944 10/16/22 1952 10/17/22 0424  NA 140 144 138  K 4.3 4.1 3.6  CL 105 102 99  CO2 27 26 22   GLUCOSE 98 128* 190*  BUN 17 13 12   CREATININE 0.88 0.77 0.87  CALCIUM 9.2 9.7 8.8*  MG  --   --  1.8   Recent Labs    08/17/22 0944 10/16/22 1952  AST 13 20  ALT 8 16  ALKPHOS  --  111  BILITOT 0.4 0.5  PROT 7.3 8.8*  ALBUMIN  --  4.0   Recent Labs    02/24/22 1546 08/17/22 0944 10/16/22 1952 10/17/22 0424  WBC 6.2 5.4 8.7 4.0  NEUTROABS 3,460 3,618  --   --   HGB 13.4 13.5 14.8 15.1*  HCT 39.6 41.1 46.8* 46.3*  MCV 91.2 91.5 92.7 90.1  PLT 254 232 228 218   Lab Results  Component Value Date   TSH 0.95 08/17/2022   Lab Results  Component Value Date   HGBA1C 6.2 (H) 08/17/2022   Lab Results  Component Value Date   CHOL 207 (H) 08/17/2022   HDL 57 08/17/2022   LDLCALC 131 (H) 08/17/2022   TRIG 91 08/17/2022   CHOLHDL 3.6 08/17/2022    Significant  Diagnostic Results in last 30 days:  MR BRAIN W WO CONTRAST  Result Date: 10/17/2022 CLINICAL DATA:  87 year old female with headache, hypertension. Indeterminate right frontal bone lesion on head CT yesterday. EXAM: MRI HEAD WITHOUT AND WITH CONTRAST TECHNIQUE:  Multiplanar, multiecho pulse sequences of the brain and surrounding structures were obtained without and with intravenous contrast. CONTRAST:  7mL GADAVIST GADOBUTROL 1 MMOL/ML IV SOLN COMPARISON:  Head CT yesterday. FINDINGS: Brain: Cerebral volume is within normal limits for age. No restricted diffusion to suggest acute infarction. No midline shift, mass effect, evidence of mass lesion, ventriculomegaly, extra-axial collection or acute intracranial hemorrhage. Cervicomedullary junction and pituitary are within normal limits. Largely normal for age gray and white matter signal. Mild, also vague nonspecific periventricular white matter T2 and FLAIR hyperintensity. No cortical encephalomalacia. No convincing chronic cerebral blood products on SWI. Deep gray nuclei, brainstem and cerebellum appear negative. No abnormal gray or white matter enhancement. And no dural thickening, including underlying a right frontal bone lesion further described below (series 14, image 26). Vascular: Major intracranial vascular flow voids are preserved. Following contrast the major dural venous sinuses are enhancing and appear to be patent. Skull and upper cervical spine: Background bone marrow signal is within normal limits. There is a mildly expansive, T1 hypointense, T2 hyperintense and stippled (series 5, image 21) expansile lesion of the right frontal bone corresponding to the CT finding. This enhances intensely following contrast (series 13, image 42) and encompasses 43 by 12 x 57 mm (AP by transverse by CC). This does have heterogeneous diffusion signal. But also has fairly circumscribed margins with the adjacent bone. No overlying abnormality of the scalp soft tissue. No similar bone lesion is identified elsewhere in the head. Sinuses/Orbits: Postoperative changes to both globes. Otherwise negative. Other: Mastoids are clear. Visible internal auditory structures appear normal. IMPRESSION: 1. Right frontal bone mildly expansile  lesion corresponding to the CT finding is highly vascular, up to 5.7 cm long axis. But no associated dural thickening or other aggressive features. And otherwise visible bone marrow signal is normal. This is indeterminate but assuming no known malignancy then a benign vascular lesion of bone (especially hemangioma) is favored. Surveillance with noncontrast Head CT recommended to document stability (such as initial follow-up in 3 months unless clinically indicated sooner). 2. No superimposed acute intracranial abnormality with largely normal for age MRI appearance of the brain. Electronically Signed   By: Odessa Fleming M.D.   On: 10/17/2022 09:39   CT Angio Chest Pulmonary Embolism (PE) W or WO Contrast  Result Date: 10/17/2022 CLINICAL DATA:  Positive D-dimer with suspected pulmonary embolism. History of CHF with 50-55% ejection fraction, previous Graves disease status post thyroidectomy. Shortness of breath on presentation. EXAM: CT ANGIOGRAPHY CHEST WITH CONTRAST TECHNIQUE: Multidetector CT imaging of the chest was performed using the standard protocol during bolus administration of intravenous contrast. Multiplanar CT image reconstructions and MIPs were obtained to evaluate the vascular anatomy. RADIATION DOSE REDUCTION: This exam was performed according to the departmental dose-optimization program which includes automated exposure control, adjustment of the mA and/or kV according to patient size and/or use of iterative reconstruction technique. CONTRAST:  75mL OMNIPAQUE IOHEXOL 350 MG/ML SOLN COMPARISON:  Limited comparison available with CTA abdomen and pelvis with contrast 07/20/2010. Portable chest today, PA Lat chest 01/11/2018. FINDINGS: Cardiovascular: There is mild panchamber cardiomegaly. No findings of acute right heart strain. There are scattered calcific plaques in the aorta and great vessels. Calcifications are scattered across the aortic valve  leaflets. There is no aortic aneurysm, stenosis or  dissection. The great vessels are widely patent with minimal scattered calcifications. Pulmonary veins are nondistended. The pulmonary trunk is prominent measuring 3 cm indicating arterial hypertension but borderline prominence of both main arteries. There is no evidence of arterial embolus. There are patchy three-vessel coronary artery calcifications. There is no pericardial effusion. Mediastinum/Nodes: Mildly enlarged heterogeneous thyroid with scattered dystrophic calcifications and nodules. Largest focal nodule is in the right lobe measuring 1.6 cm. Nonemergent ultrasound follow-up is recommended. Axillary spaces are clear. There are calcified lymph nodes in the subcarinal and precarinal mediastinum and right paratracheal space. A few slightly enlarged lymph nodes are noted with a right hilar lymph node of 1.3 cm in short axis on 5:60, precarinal lymph node 1.2 cm in short axis on 5:54, and arch left periaortic lymph node measuring 1.1 cm on 5:48. There is no bulky, encasing or further adenopathy. Small hiatal hernia. Unremarkable thoracic esophagus. No filling defect in thoracic trachea and main bronchi. Lungs/Pleura: There are mild centrilobular and paraseptal emphysematous changes in the upper lobes. Scattered areas of subpleural reticulation in the lung bases are similar to twelve years ago, with mild bilateral lower lobe medial basal traction bronchiectasis and paraspinal scarring increased somewhat in the interval. No bronchial plugging is seen or evidence of focal pneumonia. There are scattered linear scar-like opacities. There is a 4 mm right upper lobe pleural-based nodule anteriorly on 6:33, and a 3 mm nodule laterally in the base of the right upper lobe on 6:56. There are no further nodules. No pleural effusion, thickening or pneumothorax. Upper Abdomen: No acute abnormality. Abdominal aortic atherosclerosis. Musculoskeletal: Mild reverse S shaped thoracic scoliosis, spondylosis and degenerative disc  disease multiple levels. No acute or suspicious osseous findings are seen. There is no mass in the visualized chest wall. Review of the MIP images confirms the above findings. IMPRESSION: 1. No acute chest CT or CTA findings. 2. Cardiomegaly with aortic and coronary artery atherosclerosis. 3. Prominent pulmonary trunk indicating arterial hypertension but no evidence of arterial embolus. 4. Chronic changes in the lung bases. 5. Two tiny nodules in the right upper lobe, largest 4 mm. No follow-up needed if patient is low-risk (and has no known or suspected primary neoplasm). Non-contrast chest CT can be considered in 12 months if patient is high-risk. This recommendation follows the consensus statement: Guidelines for Management of Incidental Pulmonary Nodules Detected on CT Images: From the Fleischner Society 2017; Radiology 2017; 284:228-243. 6. Few mildly enlarged mediastinal and right hilar lymph nodes. 7. Mildly enlarged heterogeneous thyroid with nodules, largest 1.6 cm. Nonemergent ultrasound follow-up is recommended. 8. Small hiatal hernia. 9. Emphysema. Emphysema (ICD10-J43.9). Electronically Signed   By: Almira Bar M.D.   On: 10/17/2022 01:58   CT Head Wo Contrast  Result Date: 10/16/2022 CLINICAL DATA:  Headache and hypertension. EXAM: CT HEAD WITHOUT CONTRAST TECHNIQUE: Contiguous axial images were obtained from the base of the skull through the vertex without intravenous contrast. RADIATION DOSE REDUCTION: This exam was performed according to the departmental dose-optimization program which includes automated exposure control, adjustment of the mA and/or kV according to patient size and/or use of iterative reconstruction technique. COMPARISON:  None Available. FINDINGS: Brain: There is mild cerebral atrophy with widening of the extra-axial spaces and ventricular dilatation. There are areas of decreased attenuation within the white matter tracts of the supratentorial brain, consistent with  microvascular disease changes. Vascular: No hyperdense vessel or unexpected calcification. Skull: A 3.2 cm x 1.2 cm  x 1.4 cm mildly expansile, mixed lytic and sclerotic area is seen within the right frontal bone. Sinuses/Orbits: No acute finding. Other: None. IMPRESSION: 1. Generalized cerebral atrophy with chronic white matter small vessel ischemic changes. 2. No acute intracranial abnormality. 3. Mildly expansile, mixed lytic and sclerotic area within the right frontal bone. Correlation with nonemergent follow-up MRI is recommended. Electronically Signed   By: Aram Candela M.D.   On: 10/16/2022 22:39   DG Chest Port 1 View  Result Date: 10/16/2022 CLINICAL DATA:  Shortness of breath EXAM: PORTABLE CHEST 1 VIEW COMPARISON:  01/11/2018 FINDINGS: Mild cardiomegaly. No acute airspace disease or effusion. Aortic atherosclerosis. No pneumothorax. Scoliosis of the spine. Mildly prominent hilar vessels, possible arterial hypertension IMPRESSION: Mild cardiomegaly. Electronically Signed   By: Jasmine Pang M.D.   On: 10/16/2022 20:32    Assessment/Plan There are no diagnoses linked to this encounter.   Family/ staff Communication: Reviewed plan of care with patient  Labs/tests ordered: None   Next Appointment:   Caesar Bookman, NP

## 2022-10-24 LAB — CBC WITH DIFFERENTIAL/PLATELET
Absolute Monocytes: 602 {cells}/uL (ref 200–950)
Basophils Absolute: 28 {cells}/uL (ref 0–200)
Basophils Relative: 0.4 %
Eosinophils Absolute: 259 {cells}/uL (ref 15–500)
Eosinophils Relative: 3.7 %
HCT: 41.2 % (ref 35.0–45.0)
Hemoglobin: 13.4 g/dL (ref 11.7–15.5)
Lymphs Abs: 1610 {cells}/uL (ref 850–3900)
MCH: 30.2 pg (ref 27.0–33.0)
MCHC: 32.5 g/dL (ref 32.0–36.0)
MCV: 93 fL (ref 80.0–100.0)
MPV: 10 fL (ref 7.5–12.5)
Monocytes Relative: 8.6 %
Neutro Abs: 4501 {cells}/uL (ref 1500–7800)
Neutrophils Relative %: 64.3 %
Platelets: 255 10*3/uL (ref 140–400)
RBC: 4.43 10*6/uL (ref 3.80–5.10)
RDW: 12.5 % (ref 11.0–15.0)
Total Lymphocyte: 23 %
WBC: 7 10*3/uL (ref 3.8–10.8)

## 2022-10-24 LAB — BASIC METABOLIC PANEL
BUN: 24 mg/dL (ref 7–25)
CO2: 24 mmol/L (ref 20–32)
Calcium: 9.3 mg/dL (ref 8.6–10.4)
Chloride: 105 mmol/L (ref 98–110)
Creat: 0.86 mg/dL (ref 0.60–0.95)
Glucose, Bld: 102 mg/dL — ABNORMAL HIGH (ref 65–99)
Potassium: 4.1 mmol/L (ref 3.5–5.3)
Sodium: 141 mmol/L (ref 135–146)

## 2022-12-12 ENCOUNTER — Other Ambulatory Visit: Payer: Medicare Other

## 2022-12-15 ENCOUNTER — Other Ambulatory Visit: Payer: Self-pay | Admitting: Family

## 2022-12-15 DIAGNOSIS — I10 Essential (primary) hypertension: Secondary | ICD-10-CM

## 2023-01-22 ENCOUNTER — Other Ambulatory Visit: Payer: Medicare Other

## 2023-01-26 ENCOUNTER — Ambulatory Visit: Payer: Medicare Other | Attending: Internal Medicine | Admitting: Internal Medicine

## 2023-01-26 VITALS — BP 180/98 | HR 75 | Ht 60.0 in | Wt 170.0 lb

## 2023-01-26 DIAGNOSIS — R002 Palpitations: Secondary | ICD-10-CM | POA: Diagnosis not present

## 2023-01-26 DIAGNOSIS — I10 Essential (primary) hypertension: Secondary | ICD-10-CM | POA: Diagnosis not present

## 2023-01-26 MED ORDER — AMLODIPINE BESYLATE 10 MG PO TABS
10.0000 mg | ORAL_TABLET | Freq: Every day | ORAL | 3 refills | Status: AC
Start: 2023-01-26 — End: ?

## 2023-01-26 MED ORDER — CARVEDILOL 6.25 MG PO TABS: 6.2500 mg | ORAL_TABLET | Freq: Two times a day (BID) | ORAL | 3 refills | Status: AC

## 2023-01-26 MED ORDER — OLMESARTAN MEDOXOMIL 40 MG PO TABS: 40.0000 mg | ORAL_TABLET | Freq: Every day | ORAL | 3 refills | Status: DC

## 2023-01-26 NOTE — Patient Instructions (Addendum)
Medication Instructions:  Increase Norvasc 10 mg  *If you need a refill on your cardiac medications before your next appointment, please call your pharmacy*   Lab Work: None needed  If you have labs (blood work) drawn today and your tests are completely normal, you will receive your results only by: MyChart Message (if you have MyChart) OR A paper copy in the mail If you have any lab test that is abnormal or we need to change your treatment, we will call you to review the results.   Testing/Procedures: None    Follow-Up: At [facility], you and your health needs are our priority.  As part of our continuing mission to provide you with exceptional heart care, we have created designated Provider Care Teams.  These Care Teams include your primary Cardiologist (physician) and Advanced Practice Providers (APPs -  Physician Assistants and Nurse Practitioners) who all work together to provide you with the care you need, when you need it.    Your next appointment:   6 months   Provider:   Maisie Fus, MD     Other Instructions Pharmacy appointment in 3 months for HTN  Please keep a BP log 1 week prior to the pharmacy appointment

## 2023-01-26 NOTE — Progress Notes (Signed)
Cardiology Office Note:    Date:  01/26/2023   ID:  Ashley Johns, DOB May 28, 1935, MRN [id number]  PCP:  Ashley Bookman, NP   [facility] Providers Cardiologist:  Ashley Fus, MD     Referring MD: Ashley Bookman, NP   No chief complaint on file. DOE  History of Present Illness:    Ashley Johns is a 87 y.o. female with a hx of graves dx s/p thyroidecotmy, htn referral from Ashley Johns referral for SOB for 2-3 months  She notes SOB with walking upstairs. She then rests. Just walking to the garbage can she gets SOB. She did note some chest pressure with walking. Last week it was worse. She's had a lot of coughing. Daughter notes she has seasonal allergies. No LE edema. She's lost weight. She denies orthopnea, PND.  Has not seen a cardiologist. No hx of stress test.  No cath. TSH is normal.  Former smoker. She started in her 20s-30s.   She comes in with her daughter. They are from here. She lived in [place] briefly many years ago  EKG 04/23/2021-NSR,PACs  Interim Hx: For DOE, spect myoview was completed which was low risk. Her TTE showed EF 50-55, RV was normal, mild-moderate MR (no intrinsic valve dx), Aortic mean gradient 10 mmHg with a calcified valve. Blood pressure elevated today 160/90 ; typically systolics < 140 mmHg and normal DBP. She was stressed this AM. She still has SOB. She denies PND, orthopnea, no LE edema. If she travels at a fast pace with her mask that's when she notes it. If she walks normally it is fine. She can get from her car to church without issues. [facility].   Interim 01/19/2022 She notes some palpitations yesterday and some on Saturday. She was stumbling.  Was walking when it was warmer outside. It's more challenging when it is cold. She feels better when she walks. No syncope.   Interim 03/03/2022 She was seen by her primary provider for dizziness and returns after 1 month. She noted it with  turning too quickly. Her BP is normal.  Her daughter made this appointment because she had dizziness with changing position. She was also having some SOB. She feels better now.    Interim hx 01/26/2023 She was September of this year.  She presented with headache shortness of breath and left leg swelling.  She reports she loss consciousness. She was satting in the upper 80s in the emergency room and tachypneic, her blood pressure was 208/132.  BNP only 127.  Chest CT was negative for any acute findings. ECG significant for PVCs and LVH.  She had a head CT which was negative for acute stroke.  She had no signs of infection. She was managed with nebs and IV Solu-Medrol.  No report of arrhythmia. She was only there for day and then discharged. Today her blood pressure is high again; however she is quite distressed because one of her church members has passed away.   Past Medical History:  Diagnosis Date   Eczema    Per [facility] new patient packet   Graves disease    Per [facility] new patient packet   High blood pressure    Per [facility] new patient packet   Hypertension     Past Surgical History:  Procedure Laterality Date   TOTAL ABDOMINAL HYSTERECTOMY Bilateral 1970's   TUBAL LIGATION      Current Medications:  No outpatient medications have been marked as taking for the 01/26/23 encounter (Appointment) with Ashley Fus, MD.     Allergies:   Codeine, Tramadol, and Aspirin   Social History   Socioeconomic History   Marital status: Widowed    Spouse name: Not on file   Number of children: Not on file   Years of education: Not on file   Highest education level: Not on file  Occupational History   Not on file  Tobacco Use   Smoking status: Former   Smokeless tobacco: Never   Tobacco comments:    Smoked for 6 years, Quit at age 16  Vaping Use   Vaping status: Never Used  Substance and Sexual Activity   Alcohol use: Never   Drug use: Never   Sexual activity: Not Currently  Other Topics  Concern   Not on file  Social History Narrative   Diet      Do you drink/eat things with caffeine: Yes      Marital Status: Widowed  What year were you married? 1972      Do you live in a house, apartment, assisted living, condo, trailer, etc.? Townhouse      Is it one or more stories? 2      How many persons live in your home? 3         Do you have any pets in your home?(please list): Yes, dog and fish      Highest level of education completed: High School      Current or past profession: Day care owner, foster parent      Do you exercise?: sometimes Type and how often: Walking      Living Will? No      DNR form? No  If not, do you wish to discuss one?: Yes      POA/HPOA forms? No      Difficulty bathing or dressing yourself? No      Difficulty preparing food or eating? No      Difficulty managing medications? No      Difficulty managing your finances? No      Difficulty affording your medications? No                     Social Drivers of Corporate investment banker Strain: Low Risk  (12/09/2017)   Overall Financial Resource Strain (CARDIA)    Difficulty of Paying Living Expenses: Not hard at all  Food Insecurity: No Food Insecurity (10/17/2022)   Hunger Vital Sign    Worried About Running Out of Food in the Last Year: Never true    Ran Out of Food in the Last Year: Never true  Transportation Needs: No Transportation Needs (10/17/2022)   PRAPARE - Administrator, Civil Service (Medical): No    Lack of Transportation (Non-Medical): No  Physical Activity: Unknown (09/08/2018)   Exercise Vital Sign    Days of Exercise per Week: 1 day    Minutes of Exercise per Session: Not on file  Stress: No Stress Concern Present (12/09/2017)   [employer] of Occupational Health - Occupational Stress Questionnaire    Feeling of Stress : Not at all  Social Connections: Not on file     Family History: The patient's family history includes Hypertension in  her father; Tuberculosis in her mother.  ROS:   Please see the history of present illness.     All other systems reviewed and are negative.  EKGs/Labs/Other Studies Reviewed:    The following studies were reviewed today:   EKG:  EKG is  ordered today.  The ekg ordered today demonstrates   05/01/2021- NSR, PACs  01/19/2022- NSR with 1st degree AV block    05/21/2021- SPECT,  low risk study. No scar or inducible ischemia  05/21/2021- EF ~55%, noted hypokinesis of the inferolateral wall, don't appreciate significant hypokinesis. SPECT was nl. Aortic valve is calcified  meand gradient of 10 mmHg , mild to moderate AS. MR appears more mild  [place] 01/19/2022- Brief SVT, minimal PVCs  Recent Labs: 08/17/2022: TSH 0.95 10/16/2022: ALT 16; B Natriuretic Peptide 126.5 10/17/2022: Magnesium 1.8 10/23/2022: BUN 24; Creat 0.86; Hemoglobin 13.4; Platelets 255; Potassium 4.1; Sodium 141   Recent Lipid Panel    Component Value Date/Time   CHOL 207 (H) 08/17/2022 0944   TRIG 91 08/17/2022 0944   HDL 57 08/17/2022 0944   CHOLHDL 3.6 08/17/2022 0944   LDLCALC 131 (H) 08/17/2022 0944     Risk Assessment/Calculations:           Physical Exam:    VS:  There were no vitals filed for this visit.    Wt Readings from Last 3 Encounters:  10/23/22 166 lb (75.3 kg)  10/17/22 164 lb 9.6 oz (74.7 kg)  08/24/22 170 lb (77.1 kg)     GEN:  Well nourished, well developed in no acute distress HEENT: Normal NECK: No JVD; No carotid bruits LYMPHATICS: No lymphadenopathy CARDIAC: RRR, SEM RUSB, rubs, gallops RESPIRATORY:  Clear to auscultation without rales, wheezing or rhonchi  ABDOMEN: Soft, non-tender, non-distended MUSCULOSKELETAL:  No edema; No deformity  SKIN: Warm and dry NEUROLOGIC:  Alert and oriented x 3 PSYCHIATRIC:  Normal affect   ASSESSMENT:   Prior DOE Had normal prior Lexiscan stress in April 2023 Recently hospitalized and managed with nebulizers. No hx of COPD.   Although she did not have any distinctive signs on her imaging and has no history.  Her BNP was low, not diagnosed with heart failure  #HTN: typically controlled at home. Was on atenolol,  switched to coreg 6.25 mg BID.  Continue olmesartan 40 mg daily. Will increase her  norvasc 5 mg daily.  Recommended ambulatory BP monitoring  #Dizziness: related to BPPV. If persistent and not related to position, can consider carotid duplex  #Palpitations: minimal SVT, PVCs. Continue coreg  #Aortic Stenosis/Mild-Moderate: . Echo showed mild-moderate MR and mild-mod AS. E/e' 11 mildly elevated. Suspect this is related to her aortic stenosis. She does not report any syncopal events. Her mean gradient was 10 mmHg but the valve is calcified; may not have captured the true peak velocity.   #Mild MR: follow up echo surveillance in 2-3 years    HLD: continue lipitor 20 mg daily  PLAN:    In order of problems listed above:   Increase norvasc 5 to 10 mg daily Pharmacy visit for HTN in 3 months: BP log 1 week prior, 1 hr after taking medications Follow up in 6 months  Medication Adjustments/Labs and Tests Ordered: Current medicines are reviewed at length with the patient today.  Concerns regarding medicines are outlined above.  No orders of the defined types were placed in this encounter.  No orders of the defined types were placed in this encounter.   There are no Patient Instructions on file for this visit.   Signed, Ashley Fus, MD  01/26/2023 10:03 AM    [facility]

## 2023-02-17 ENCOUNTER — Ambulatory Visit (INDEPENDENT_AMBULATORY_CARE_PROVIDER_SITE_OTHER): Payer: Medicare Other | Admitting: Family

## 2023-02-17 ENCOUNTER — Encounter: Payer: Self-pay | Admitting: Family

## 2023-02-17 VITALS — BP 154/90 | HR 76 | Temp 97.4°F | Resp 20 | Ht 60.0 in | Wt 173.8 lb

## 2023-02-17 DIAGNOSIS — L308 Other specified dermatitis: Secondary | ICD-10-CM

## 2023-02-17 DIAGNOSIS — R059 Cough, unspecified: Secondary | ICD-10-CM

## 2023-02-17 DIAGNOSIS — I10 Essential (primary) hypertension: Secondary | ICD-10-CM

## 2023-02-17 DIAGNOSIS — E785 Hyperlipidemia, unspecified: Secondary | ICD-10-CM | POA: Diagnosis not present

## 2023-02-17 DIAGNOSIS — R7303 Prediabetes: Secondary | ICD-10-CM

## 2023-02-17 MED ORDER — MOMETASONE FUROATE 0.1 % EX CREA: 1.0000 | TOPICAL_CREAM | Freq: Every day | CUTANEOUS | 3 refills | Status: DC | PRN

## 2023-02-17 MED ORDER — BENZONATATE 100 MG PO CAPS: 100.0000 mg | ORAL_CAPSULE | Freq: Two times a day (BID) | ORAL | 0 refills | Status: DC | PRN

## 2023-02-17 MED ORDER — ATORVASTATIN CALCIUM 20 MG PO TABS: 20.0000 mg | ORAL_TABLET | Freq: Every day | ORAL | 1 refills | Status: DC

## 2023-02-17 NOTE — Progress Notes (Signed)
 Provider: Roxan Plough FNP-C   Shandelle Borrelli, Roxan BROCKS, NP  Patient Care Team: Cambreigh Dearing, Roxan BROCKS, NP as PCP - General (Family Medicine) Alvan Ronal BRAVO, MD as PCP - Cardiology (Cardiology) Lynnell Nottingham, MD as Consulting Physician (Dermatology)  Extended Emergency Contact Information Primary Emergency Contact: [name],[name]  [place] Mobile Phone: [phone] Relation: Daughter Secondary Emergency Contact: booker,melonie Mobile Phone: [phone] Relation: Granddaughter  Code Status:  Full Code  Goals of care: Advanced Directive information    10/23/2022    9:33 AM  Advanced Directives  Does Patient Have a Medical Advance Directive? No     Chief Complaint  Patient presents with   Medical Management of Chronic Issues    Patient presents today for a 6 month follow-up   Quality Metric Gaps    AWV,pneumonia, zoster, COVID#6    HPI:  Pt is a 88 y.o. female seen today for 6 months for medical management of chronic diseases. She is here with her daughter who provides additional HPI information.states has been coughing lately.No exposure to sick person.denies any fever,chills,runny nose or body aches.No shortness of breath,wheezing or edema on lower extremities.she does require albuterol  inhaler if going up the steps or rushing to get upstairs when the phone rings. Has had 3 lbs weight gain since last visit.states might be from eating chocolate though does not eat everyday.   Hypertension - checks Blood pressure sometimes at home readings varies SBP 160's -170's but latest was 130's depending on what she eats or snacks on.Has been trying to avoid salty chips.she denies any headache,dizziness,vision changes,fatigue,chest tightness,palpitation,chest pain or shortness of breath.   Immunization - up to date except due for PNA,shingles and COVID-19 vaccine.    Past Medical History:  Diagnosis Date   Eczema    Per [facility] new patient packet   Graves disease    Per [facility]  new patient packet   High blood pressure    Per [facility] new patient packet   Hypertension    Past Surgical History:  Procedure Laterality Date   TOTAL ABDOMINAL HYSTERECTOMY Bilateral 1970's   TUBAL LIGATION      Allergies  Allergen Reactions   Codeine Nausea Only    unknown   Tramadol Nausea Only   Aspirin Rash    Allergies as of 02/17/2023       Reactions   Codeine Nausea Only   unknown   Tramadol Nausea Only   Aspirin Rash        Medication List        Accurate as of February 17, 2023  9:41 AM. If you have any questions, ask your nurse or doctor.          acetaminophen  650 MG CR tablet Commonly known as: TYLENOL  Take 650 mg by mouth every 8 (eight) hours as needed for pain.   albuterol  108 (90 Base) MCG/ACT inhaler Commonly known as: Ventolin  HFA Inhale 2 puffs into the lungs every 4 (four) hours as needed for wheezing or shortness of breath (coughing fits).   amLODipine  10 MG tablet Commonly known as: NORVASC  Take 1 tablet (10 mg total) by mouth daily.   atorvastatin  20 MG tablet Commonly known as: LIPITOR Take 1 tablet (20 mg total) by mouth daily.   benzonatate  100 MG capsule Commonly known as: TESSALON  Take 1 capsule (100 mg total) by mouth 2 (two) times daily as needed for cough.   carvedilol  6.25 MG tablet Commonly known as: COREG  Take 1 tablet (6.25 mg total) by mouth  2 (two) times daily.   cetirizine  10 MG tablet Commonly known as: ZYRTEC  Take 10 mg by mouth as needed for allergies.   Debrox 6.5 % OTIC solution Generic drug: carbamide peroxide Place 5 drops into the right ear 2 (two) times daily.   EPINEPHrine  0.3 mg/0.3 mL Soaj injection Commonly known as: EpiPen  2-Pak Inject 0.3 mg into the muscle as needed. Inject 0.3mg  intramuscular route once as needed.   fluticasone  50 MCG/ACT nasal spray Commonly known as: FLONASE  SHAKE LIQUID AND USE 1 SPRAY IN EACH NOSTRIL TWICE DAILY AS NEEDED FOR NASAL CONGESTION   meloxicam  7.5 MG  tablet Commonly known as: MOBIC  Take 1 tablet (7.5 mg total) by mouth daily as needed for pain. May increase to two daily if ineffective and notify provider   mometasone  0.1 % cream Commonly known as: ELOCON  Apply 1 Application topically daily as needed.   montelukast  10 MG tablet Commonly known as: SINGULAIR  Take 1 tablet (10 mg total) by mouth at bedtime.   MUCINEX  DM PO Take 2 capsules by mouth as needed.   olmesartan  40 MG tablet Commonly known as: BENICAR  Take 1 tablet (40 mg total) by mouth daily.   Spiriva  Respimat 1.25 MCG/ACT Aers Generic drug: Tiotropium Bromide Monohydrate  Inhale 2 puffs into the lungs daily.        Review of Systems  Constitutional:  Negative for appetite change, chills, fatigue, fever and unexpected weight change.  HENT:  Positive for hearing loss. Negative for congestion, dental problem, ear discharge, ear pain, facial swelling, nosebleeds, postnasal drip, rhinorrhea, sinus pressure, sinus pain, sneezing, sore throat, tinnitus and trouble swallowing.   Eyes:  Negative for pain, discharge, redness, itching and visual disturbance.  Respiratory:  Positive for cough. Negative for chest tightness, shortness of breath and wheezing.   Cardiovascular:  Negative for chest pain, palpitations and leg swelling.  Gastrointestinal:  Negative for abdominal distention, abdominal pain, blood in stool, constipation, diarrhea, nausea and vomiting.  Endocrine: Negative for cold intolerance, heat intolerance, polydipsia, polyphagia and polyuria.  Genitourinary:  Negative for difficulty urinating, dysuria, flank pain, frequency and urgency.  Musculoskeletal:  Negative for arthralgias, back pain, gait problem, joint swelling, myalgias, neck pain and neck stiffness.  Skin:  Negative for color change, pallor, rash and wound.  Neurological:  Negative for dizziness, syncope, speech difficulty, weakness, light-headedness, numbness and headaches.  Hematological:  Does not  bruise/bleed easily.  Psychiatric/Behavioral:  Negative for agitation, behavioral problems, confusion, hallucinations, self-injury, sleep disturbance and suicidal ideas. The patient is not nervous/anxious.     Immunization History  Administered Date(s) Administered   Fluad Quad(high Dose 65+) 10/11/2018   Fluad Trivalent(High Dose 65+) 10/23/2022   Influenza, High Dose Seasonal PF 12/03/2015, 11/07/2019   Influenza,inj,Quad PF,6+ Mos 10/11/2018   Influenza-Unspecified 11/16/2017, 11/27/2020   PFIZER Comirnaty(Gray Top)Covid-19 Tri-Sucrose Vaccine 07/31/2020   PFIZER(Purple Top)SARS-COV-2 Vaccination 02/18/2019, 03/11/2019, 11/29/2019, 07/31/2020   Pneumococcal Conjugate-13 10/11/2018   Td 10/11/2018   Tdap 10/11/2018   Pertinent  Health Maintenance Due  Topic Date Due   INFLUENZA VACCINE  Completed   DEXA SCAN  Completed      08/22/2021    9:06 AM 08/26/2021    9:28 AM 02/24/2022    3:01 PM 07/03/2022    1:08 PM 08/17/2022    8:46 AM  Fall Risk  Falls in the past year? 0 0 0 0 0  Was there an injury with Fall? 0 0 0 0 0  Fall Risk Category Calculator 0 0 0 0 0  Fall Risk Category (Retired) Low Low     (RETIRED) Patient Fall Risk Level Low fall risk Low fall risk     Patient at Risk for Falls Due to No Fall Risks No Fall Risks No Fall Risks No Fall Risks No Fall Risks  Fall risk Follow up Falls evaluation completed Falls evaluation completed Falls evaluation completed Falls evaluation completed Falls evaluation completed   Functional Status Survey:    Vitals:   02/17/23 0926 02/17/23 0931  BP: (!) 150/90 (!) 154/90  Pulse: 76   Resp: 20   Temp: (!) 97.4 F (36.3 C)   SpO2: 92%   Weight: 173 lb 12.8 oz (78.8 kg)   Height: 5' (1.524 m)    Body mass index is 33.94 kg/m. Physical Exam Vitals reviewed.  Constitutional:      General: She is not in acute distress.    Appearance: Normal appearance. She is obese. She is not ill-appearing or diaphoretic.  HENT:     Head:  Normocephalic.     Right Ear: Tympanic membrane, ear canal and external ear normal. There is no impacted cerumen.     Left Ear: Tympanic membrane, ear canal and external ear normal. There is no impacted cerumen.     Ears:     Comments: Bilateral hearing aids in place     Nose: Nose normal. No congestion or rhinorrhea.     Mouth/Throat:     Mouth: Mucous membranes are moist.     Pharynx: Oropharynx is clear. No oropharyngeal exudate or posterior oropharyngeal erythema.  Eyes:     General: No scleral icterus.       Right eye: No discharge.        Left eye: No discharge.     Extraocular Movements: Extraocular movements intact.     Conjunctiva/sclera: Conjunctivae normal.     Pupils: Pupils are equal, round, and reactive to light.  Neck:     Vascular: No carotid bruit.  Cardiovascular:     Rate and Rhythm: Normal rate and regular rhythm.     Pulses: Normal pulses.     Heart sounds: Normal heart sounds. No murmur heard.    No friction rub. No gallop.  Pulmonary:     Effort: Pulmonary effort is normal. No respiratory distress.     Breath sounds: Normal breath sounds. No wheezing, rhonchi or rales.  Chest:     Chest wall: No tenderness.  Abdominal:     General: Bowel sounds are normal. There is no distension.     Palpations: Abdomen is soft. There is no mass.     Tenderness: There is no abdominal tenderness. There is no right CVA tenderness, left CVA tenderness, guarding or rebound.  Musculoskeletal:        General: No swelling or tenderness. Normal range of motion.     Cervical back: Normal range of motion. No rigidity or tenderness.     Right lower leg: No edema.     Left lower leg: No edema.  Lymphadenopathy:     Cervical: No cervical adenopathy.  Skin:    General: Skin is warm and dry.     Coloration: Skin is not pale.     Findings: No bruising, erythema, lesion or rash.  Neurological:     Mental Status: She is alert and oriented to person, place, and time.     Cranial  Nerves: No cranial nerve deficit.     Sensory: No sensory deficit.     Motor: No weakness.  Coordination: Coordination normal.     Gait: Gait normal.  Psychiatric:        Mood and Affect: Mood normal.        Speech: Speech normal.        Behavior: Behavior normal.        Thought Content: Thought content normal.        Judgment: Judgment normal.     Labs reviewed: Recent Labs    10/16/22 1952 10/17/22 0424 10/23/22 1049  NA 144 138 141  K 4.1 3.6 4.1  CL 102 99 105  CO2 26 22 24   GLUCOSE 128* 190* 102*  BUN 13 12 24   CREATININE 0.77 0.87 0.86  CALCIUM  9.7 8.8* 9.3  MG  --  1.8  --    Recent Labs    08/17/22 0944 10/16/22 1952  AST 13 20  ALT 8 16  ALKPHOS  --  111  BILITOT 0.4 0.5  PROT 7.3 8.8*  ALBUMIN  --  4.0   Recent Labs    02/24/22 1546 08/17/22 0944 10/16/22 1952 10/17/22 0424 10/23/22 1049  WBC 6.2 5.4 8.7 4.0 7.0  NEUTROABS 3,460 3,618  --   --  4,501  HGB 13.4 13.5 14.8 15.1* 13.4  HCT 39.6 41.1 46.8* 46.3* 41.2  MCV 91.2 91.5 92.7 90.1 93.0  PLT 254 232 228 218 255   Lab Results  Component Value Date   TSH 0.95 08/17/2022   Lab Results  Component Value Date   HGBA1C 6.2 (H) 08/17/2022   Lab Results  Component Value Date   CHOL 207 (H) 08/17/2022   HDL 57 08/17/2022   LDLCALC 131 (H) 08/17/2022   TRIG 91 08/17/2022   CHOLHDL 3.6 08/17/2022    Significant Diagnostic Results in last 30 days:  No results found.  Assessment/Plan 1. Essential hypertension (Primary) Blood pressure slightly elevated on arrival but has improved blood compared to previous visit.  Takes carvedilol  as needed. -Continue on olmesartan , amlodipine  and carvedilol  -Advised to notify provider if blood pressure consistently above 140/90 -Advised to reduce salt intake -Continue to follow-up in the cardiology as scheduled - Complete Metabolic Panel with eGFR - CBC With Differential/Platelet  2. Prediabetes Lab Results  Component Value Date   HGBA1C 6.2  (H) 08/17/2022  -Advised to reduce sweets and sugary foods or snacks -Exercise as tolerated - Hemoglobin A1c  3. Hyperlipidemia LDL goal <100 Previous LDL 131 -Continue on atorvastatin  and dietary modifications - Complete Metabolic Panel with eGFR - CBC With Differential/Platelet - Lipid Panel - atorvastatin  (LIPITOR) 20 MG tablet; Take 1 tablet (20 mg total) by mouth daily.  Dispense: 90 tablet; Refill: 1  4. Cough, unspecified type Afebrile -Bilateral lungs clear to auscultation -Continue on montelukast  and albuterol  -Notify provider if symptoms worsen or fail to improve - benzonatate  (TESSALON ) 100 MG capsule; Take 1 capsule (100 mg total) by mouth 2 (two) times daily as needed for cough.  Dispense: 20 capsule; Refill: 0  5. Other eczema Has improved on mometasone  - mometasone  (ELOCON ) 0.1 % cream; Apply 1 Application topically daily as needed.  Dispense: 45 g; Refill: 3  Family/ staff Communication: Reviewed plan of care with patient and daughter verbalized understanding  Labs/tests ordered:  - Complete Metabolic Panel with eGFR - CBC With Differential/Platelet - Lipid Panel - Hemoglobin A1c  Next Appointment : Return in about 6 months (around 08/17/2023) for medical mangement of chronic issues..   Keauna Brasel C Marlaine Arey, NP

## 2023-02-18 LAB — COMPLETE METABOLIC PANEL WITH GFR
AG Ratio: 1.2 (calc) (ref 1.0–2.5)
ALT: 11 U/L (ref 6–29)
AST: 13 U/L (ref 10–35)
Albumin: 4.3 g/dL (ref 3.6–5.1)
Alkaline phosphatase (APISO): 123 U/L (ref 37–153)
BUN: 20 mg/dL (ref 7–25)
CO2: 28 mmol/L (ref 20–32)
Calcium: 9.5 mg/dL (ref 8.6–10.4)
Chloride: 102 mmol/L (ref 98–110)
Creat: 0.85 mg/dL (ref 0.60–0.95)
Globulin: 3.5 g/dL (ref 1.9–3.7)
Glucose, Bld: 99 mg/dL (ref 65–99)
Potassium: 4.5 mmol/L (ref 3.5–5.3)
Sodium: 140 mmol/L (ref 135–146)
Total Bilirubin: 0.4 mg/dL (ref 0.2–1.2)
Total Protein: 7.8 g/dL (ref 6.1–8.1)
eGFR: 67 mL/min/{1.73_m2} (ref >=60)

## 2023-02-18 LAB — CBC WITH DIFFERENTIAL/PLATELET
Absolute Lymphocytes: 1579 {cells}/uL (ref 850–3900)
Absolute Monocytes: 504 {cells}/uL (ref 200–950)
Basophils Absolute: 22 {cells}/uL (ref 0–200)
Basophils Relative: 0.4 %
Eosinophils Absolute: 269 {cells}/uL (ref 15–500)
Eosinophils Relative: 4.8 %
HCT: 42.1 % (ref 35.0–45.0)
Hemoglobin: 13.7 g/dL (ref 11.7–15.5)
MCH: 30.1 pg (ref 27.0–33.0)
MCHC: 32.5 g/dL (ref 32.0–36.0)
MCV: 92.5 fL (ref 80.0–100.0)
MPV: 10.2 fL (ref 7.5–12.5)
Monocytes Relative: 9 %
Neutro Abs: 3226 {cells}/uL (ref 1500–7800)
Neutrophils Relative %: 57.6 %
Platelets: 260 10*3/uL (ref 140–400)
RBC: 4.55 10*6/uL (ref 3.80–5.10)
RDW: 12.5 % (ref 11.0–15.0)
Total Lymphocyte: 28.2 %
WBC: 5.6 10*3/uL (ref 3.8–10.8)

## 2023-02-18 LAB — LIPID PANEL
Cholesterol: 219 mg/dL — ABNORMAL HIGH (ref <200)
HDL: 69 mg/dL (ref >=50)
LDL Cholesterol (Calc): 134 mg/dL — ABNORMAL HIGH
Non-HDL Cholesterol (Calc): 150 mg/dL — ABNORMAL HIGH (ref <130)
Total CHOL/HDL Ratio: 3.2 (calc) (ref <5.0)
Triglycerides: 69 mg/dL (ref <150)

## 2023-02-18 LAB — HEMOGLOBIN A1C
Hgb A1c MFr Bld: 6.3 %{Hb} — ABNORMAL HIGH (ref <5.7)
Mean Plasma Glucose: 134 mg/dL
eAG (mmol/L): 7.4 mmol/L

## 2023-03-17 ENCOUNTER — Encounter: Payer: Medicare Other | Admitting: Family

## 2023-03-17 NOTE — Progress Notes (Signed)
   This encounter was created in error - please disregard. No show

## 2023-03-25 ENCOUNTER — Other Ambulatory Visit (HOSPITAL_COMMUNITY): Payer: Self-pay

## 2023-03-27 ENCOUNTER — Other Ambulatory Visit: Payer: Self-pay | Admitting: Orthopedic Surgery

## 2023-03-27 DIAGNOSIS — R062 Wheezing: Secondary | ICD-10-CM

## 2023-03-31 ENCOUNTER — Other Ambulatory Visit: Payer: Self-pay | Admitting: Family

## 2023-03-31 DIAGNOSIS — R059 Cough, unspecified: Secondary | ICD-10-CM

## 2023-03-31 NOTE — Telephone Encounter (Signed)
 Pharmacy requested refill.  ?Pended Rx and sent to [facility] for approval.  ?

## 2023-04-14 ENCOUNTER — Ambulatory Visit: Payer: Self-pay | Admitting: Family

## 2023-04-14 ENCOUNTER — Encounter: Payer: Self-pay | Admitting: Sports Medicine

## 2023-04-14 ENCOUNTER — Ambulatory Visit (INDEPENDENT_AMBULATORY_CARE_PROVIDER_SITE_OTHER): Admitting: Sports Medicine

## 2023-04-14 ENCOUNTER — Ambulatory Visit
Admission: RE | Admit: 2023-04-14 | Discharge: 2023-04-14 | Disposition: A | Discharge status: Home / Self Care | Source: Ambulatory Visit | Attending: Sports Medicine

## 2023-04-14 VITALS — BP 118/74 | HR 84 | Temp 97.4°F | Resp 16 | Ht 60.0 in | Wt 173.0 lb

## 2023-04-14 DIAGNOSIS — R062 Wheezing: Secondary | ICD-10-CM

## 2023-04-14 DIAGNOSIS — R051 Acute cough: Secondary | ICD-10-CM | POA: Diagnosis not present

## 2023-04-14 DIAGNOSIS — R059 Cough, unspecified: Secondary | ICD-10-CM | POA: Diagnosis not present

## 2023-04-14 MED ORDER — BENZONATATE 200 MG PO CAPS
200.0000 mg | ORAL_CAPSULE | Freq: Two times a day (BID) | ORAL | 0 refills | Status: DC | PRN
Start: 2023-04-14 — End: 2023-04-16

## 2023-04-14 MED ORDER — ALBUTEROL SULFATE HFA 108 (90 BASE) MCG/ACT IN AERS
2.0000 | INHALATION_SPRAY | Freq: Four times a day (QID) | RESPIRATORY_TRACT | 2 refills | Status: DC | PRN
Start: 2023-04-14 — End: 2023-05-27

## 2023-04-14 NOTE — Telephone Encounter (Signed)
 Message routed to provider Dr.Veludandi who is scheduled to see patient today.

## 2023-04-14 NOTE — Telephone Encounter (Addendum)
 Copied from CRM [id number]. Topic: Clinical - Red Word Triage >> Apr 14, 2023  8:04 AM Hamdi H wrote: [facility] that prompted transfer to Nurse Triage: Increased cough that causes blood pressure to rise. They can't get good numbers on her oxygen levels.  Chief Complaint: cough Symptoms: coughing spells, weakness due to coughing, congestion Frequency: yesterday Pertinent Negatives: Patient denies fever Disposition: [] ED /[] Urgent Care (no appt availability in office) / [x] Appointment(In office/virtual)/ []  [facility]/ [] Home Care/ [] Refused Recommended Disposition /[] [facility]/ []  Follow-up with PCP Additional Notes: using albuterol as needed  Reason for Disposition  SEVERE coughing spells (e.g., whooping sound after coughing, vomiting after coughing)  Answer Assessment - Initial Assessment Questions 1. ONSET: "When did the cough begin?"      yesterday 2. SEVERITY: "How bad is the cough today?"      Severe coughing spells with some breathing issues - "breathing light" 3. SPUTUM: "Describe the color of your sputum" (none, dry cough; clear, white, yellow, green)     no 4. HEMOPTYSIS: "Are you coughing up any blood?" If so ask: "How much?" (flecks, streaks, tablespoons, etc.)     no 5. DIFFICULTY BREATHING: "Are you having difficulty breathing?" If Yes, ask: "How bad is it?" (e.g., mild, moderate, severe)    - MILD: No SOB at rest, mild SOB with walking, speaks normally in sentences, can lie down, no retractions, pulse < 100.    - MODERATE: SOB at rest, SOB with minimal exertion and prefers to sit, cannot lie down flat, speaks in phrases, mild retractions, audible wheezing, pulse 100-120.    - SEVERE: Very SOB at rest, speaks in single words, struggling to breathe, sitting hunched forward, retractions, pulse > 120      mild 6. FEVER: "Do you have a fever?" If Yes, ask: "What is your temperature, how was it measured, and when did it start?"     no 7. CARDIAC HISTORY:  "Do you have any history of heart disease?" (e.g., heart attack, congestive heart failure)     no 8. LUNG HISTORY: "Do you have any history of lung disease?"  (e.g., pulmonary embolus, asthma, emphysema)     No 9. PE RISK FACTORS: "Do you have a history of blood clots?" (or: recent major surgery, recent prolonged travel, bedridden)     no 10. OTHER SYMPTOMS: "Do you have any other symptoms?" (e.g., runny nose, wheezing, chest pain)       Wheezing yesterday and none today 11. PREGNANCY: "Is there any chance you are pregnant?" "When was your last menstrual period?"       N/a 12. TRAVEL: "Have you traveled out of the country in the last month?" (e.g., travel history, exposures)       N/a  Protocols used: Cough - Acute Non-Productive-A-AH

## 2023-04-14 NOTE — Progress Notes (Signed)
 Careteam: Patient Care Team: Ngetich, Donalee Citrin, NP as PCP - General (Family Medicine) Maisie Fus, MD as PCP - Cardiology (Cardiology) Aris Lot, MD as Consulting Physician (Dermatology)  PLACE OF SERVICE:  [facility]  Advanced Directive information Does Patient Have a Medical Advance Directive?: Yes, Type of Advance Directive: Healthcare Power of [place];Living will, Does patient want to make changes to medical advance directive?: No - Patient declined  Allergies  Allergen Reactions   Codeine Nausea Only    unknown   Tramadol Nausea Only   Aspirin Rash    Chief Complaint  Patient presents with   Acute Visit    Patient complains of cough.      Discussed the use of AI scribe software for clinical note transcription with the patient, who gave verbal consent to proceed.  History of Present Illness   Ashley Johns is an 88 year old female who presents with a persistent cough for two weeks. She is accompanied by her daughter.  She has been experiencing a persistent cough for approximately two weeks, which worsened significantly after exposure to a dusty room, to which she is highly allergic. The cough is productive with gray-colored phlegm. No fever is present, but she notes increased fatigue, especially after coughing.  She reports a change in her breathing, feeling more tired when walking or performing daily activities. She does not walk regularly but feels tired when she does. She elevates her head while sleeping and experiences tiredness when climbing stairs. No runny nose, congestion, sinus pain, ear pain, diarrhea, stomach pain, body aches, sore throat, or significant headaches, although she had a headache this morning, possibly due to coughing.  She has been using Mucinex DM since last week, along with honey and hot tea, and applies Vicks and a warm towel to her chest at night. She has an albuterol inhaler, which is nearly empty.  She mentions a history of  smoking in her twenties         Review of Systems:  Review of Systems  Constitutional:  Negative for chills and fever.  HENT:  Negative for congestion and sore throat.   Eyes:  Negative for double vision.  Respiratory:  Positive for cough and shortness of breath (exertional). Negative for sputum production.   Cardiovascular:  Negative for chest pain, palpitations and leg swelling.  Gastrointestinal:  Negative for abdominal pain, heartburn and nausea.  Genitourinary:  Negative for dysuria, frequency and hematuria.  Musculoskeletal:  Negative for falls and myalgias.  Neurological:  Negative for dizziness, sensory change and focal weakness.   Negative unless indicated in HPI.   Past Medical History:  Diagnosis Date   Eczema    Per [facility] new patient packet   Graves disease    Per [facility] new patient packet   High blood pressure    Per [facility] new patient packet   Hypertension    Past Surgical History:  Procedure Laterality Date   TOTAL ABDOMINAL HYSTERECTOMY Bilateral 1970's   TUBAL LIGATION     Social History:   reports that she has quit smoking. She has never used smokeless tobacco. She reports that she does not drink alcohol and does not use drugs.  Family History  Problem Relation Age of Onset   Tuberculosis Mother    Hypertension Father     Medications: Patient's Medications  New Prescriptions   ALBUTEROL (VENTOLIN HFA) 108 (90 BASE) MCG/ACT INHALER    Inhale 2 puffs into the lungs every 6 (six) hours as  needed for wheezing or shortness of breath.   BENZONATATE (TESSALON) 200 MG CAPSULE    Take 1 capsule (200 mg total) by mouth 2 (two) times daily as needed for cough.  Previous Medications   ACETAMINOPHEN (TYLENOL) 650 MG CR TABLET    Take 650 mg by mouth every 8 (eight) hours as needed for pain.   ALBUTEROL (VENTOLIN HFA) 108 (90 BASE) MCG/ACT INHALER    Inhale 2 puffs into the lungs every 4 (four) hours as needed for wheezing or shortness of breath (coughing fits).    AMLODIPINE (NORVASC) 10 MG TABLET    Take 1 tablet (10 mg total) by mouth daily.   ATORVASTATIN (LIPITOR) 20 MG TABLET    Take 1 tablet (20 mg total) by mouth daily.   CARVEDILOL (COREG) 6.25 MG TABLET    Take 1 tablet (6.25 mg total) by mouth 2 (two) times daily.   CETIRIZINE (ZYRTEC) 10 MG TABLET    Take 10 mg by mouth as needed for allergies.   DEXTROMETHORPHAN-GUAIFENESIN (MUCINEX DM PO)    Take 2 capsules by mouth as needed.   EPINEPHRINE (EPIPEN 2-PAK) 0.3 MG/0.3 ML IJ SOAJ INJECTION    Inject 0.3 mg into the muscle as needed. Inject 0.3mg  intramuscular route once as needed.   FLUTICASONE (FLONASE) 50 MCG/ACT NASAL SPRAY    SHAKE LIQUID AND USE 1 SPRAY IN EACH NOSTRIL TWICE DAILY AS NEEDED FOR NASAL CONGESTION   MELOXICAM (MOBIC) 7.5 MG TABLET    Take 1 tablet (7.5 mg total) by mouth daily as needed for pain. May increase to two daily if ineffective and notify provider   MOMETASONE (ELOCON) 0.1 % CREAM    Apply 1 Application topically daily as needed.   MONTELUKAST (SINGULAIR) 10 MG TABLET    TAKE 1 TABLET(10 MG) BY MOUTH AT BEDTIME   OLMESARTAN (BENICAR) 40 MG TABLET    Take 1 tablet (40 mg total) by mouth daily.   TIOTROPIUM BROMIDE MONOHYDRATE (SPIRIVA RESPIMAT) 1.25 MCG/ACT AERS    Inhale 2 puffs into the lungs daily.  Modified Medications   No medications on file  Discontinued Medications   BENZONATATE (TESSALON) 100 MG CAPSULE    TAKE 1 CAPSULE(100 MG) BY MOUTH TWICE DAILY AS NEEDED FOR COUGH    Physical Exam: Vitals:   04/14/23 0947  BP: 118/74  Pulse: 84  Resp: 16  Temp: (!) 97.4 F (36.3 C)  SpO2: 97%  Weight: 173 lb (78.5 kg)  Height: 5' (1.524 m)   Body mass index is 33.79 kg/m. BP Readings from Last 3 Encounters:  04/14/23 118/74  02/17/23 (!) 154/90  01/26/23 (!) 180/98   Wt Readings from Last 3 Encounters:  04/14/23 173 lb (78.5 kg)  02/17/23 173 lb 12.8 oz (78.8 kg)  01/26/23 170 lb (77.1 kg)    Physical Exam Constitutional:      Appearance: Normal  appearance.  HENT:     Head: Normocephalic and atraumatic.  Cardiovascular:     Rate and Rhythm: Normal rate and regular rhythm.  Pulmonary:     Effort: Pulmonary effort is normal. No respiratory distress.     Breath sounds: Normal breath sounds. No wheezing (intermittent expiratory wheezing , crackles lower lung fields).  Abdominal:     General: Bowel sounds are normal. There is no distension.     Tenderness: There is no abdominal tenderness. There is no guarding or rebound.     Comments:    Musculoskeletal:        General: No  swelling or tenderness.  Neurological:     Mental Status: She is alert. Mental status is at baseline.     Sensory: No sensory deficit.     Motor: No weakness.     Labs reviewed: Basic Metabolic Panel: Recent Labs    08/17/22 0944 10/16/22 1952 10/17/22 0424 10/23/22 1049 02/17/23 1005  NA 140   < > 138 141 140  K 4.3   < > 3.6 4.1 4.5  CL 105   < > 99 105 102  CO2 27   < > 22 24 28   GLUCOSE 98   < > 190* 102* 99  BUN 17   < > 12 24 20   CREATININE 0.88   < > 0.87 0.86 0.85  CALCIUM 9.2   < > 8.8* 9.3 9.5  MG  --   --  1.8  --   --   TSH 0.95  --   --   --   --    < > = values in this interval not displayed.   Liver Function Tests: Recent Labs    08/17/22 0944 10/16/22 1952 02/17/23 1005  AST 13 20 13   ALT 8 16 11   ALKPHOS  --  111  --   BILITOT 0.4 0.5 0.4  PROT 7.3 8.8* 7.8  ALBUMIN  --  4.0  --    No results for input(s): "LIPASE", "AMYLASE" in the last 8760 hours. No results for input(s): "AMMONIA" in the last 8760 hours. CBC: Recent Labs    08/17/22 0944 10/16/22 1952 10/17/22 0424 10/23/22 1049 02/17/23 1005  WBC 5.4   < > 4.0 7.0 5.6  NEUTROABS 3,618  --   --  4,501 3,226  HGB 13.5   < > 15.1* 13.4 13.7  HCT 41.1   < > 46.3* 41.2 42.1  MCV 91.5   < > 90.1 93.0 92.5  PLT 232   < > 218 255 260   < > = values in this interval not displayed.   Lipid Panel: Recent Labs    08/17/22 0944 02/17/23 1005  CHOL 207* 219*   HDL 57 69  LDLCALC 131* 134*  TRIG 91 69  CHOLHDL 3.6 3.2   TSH: Recent Labs    08/17/22 0944  TSH 0.95   A1C: Lab Results  Component Value Date   HGBA1C 6.3 (H) 02/17/2023    Assessment and Plan     Cough  Lungs  intermittent wheezing and basal crackles Will get chest x ray  Vitals stable Pt does not appear to be in distress Able to speak in full sentences O2 sat 97% on RA   Order chest x-ray. Will order tessalon  - Advise salt water gargling and honey.       Wheezing Intermittent wheezing Will order albuterol prn for wheezing

## 2023-04-16 ENCOUNTER — Other Ambulatory Visit: Payer: Self-pay | Admitting: Family

## 2023-04-16 DIAGNOSIS — R051 Acute cough: Secondary | ICD-10-CM

## 2023-04-16 NOTE — Telephone Encounter (Signed)
 Refer to medication list. Last refill date noted. Prescription was sent to [facility] on [address] Is this the pharmacy patient went to? Please advise.

## 2023-04-16 NOTE — Telephone Encounter (Signed)
 Copied from CRM [id number]. Topic: Clinical - Prescription Issue >> Apr 16, 2023  2:18 PM Alvino Blood C wrote: Reason for CRM: Patient called and states the following medications were not at the pharmacy. benzonatate (TESSALON) 200 MG capsule

## 2023-04-20 ENCOUNTER — Telehealth: Payer: Self-pay

## 2023-04-20 MED ORDER — BENZONATATE 200 MG PO CAPS
200.0000 mg | ORAL_CAPSULE | Freq: Two times a day (BID) | ORAL | 0 refills | Status: DC | PRN
Start: 2023-04-20 — End: 2023-05-24

## 2023-04-20 NOTE — Telephone Encounter (Signed)
 Copied from CRM [id number]. Topic: Clinical - Medication Question >> Apr 20, 2023  4:03 PM Corin V wrote: Reason for CRM: Patients is stating that her cough is not getting any better. She is wanting to know if the cough medication can be taken more than twice per day or if there is an additional medication she can take. Please call back at [phone]. Please Advise   Message sent to Ngetich, Donalee Citrin, NP

## 2023-04-20 NOTE — Telephone Encounter (Signed)
 Patient is requesting more Tessalon capsules. Medication pend and sent to PCP Ngetich, Donalee Citrin, NP

## 2023-04-21 NOTE — Telephone Encounter (Signed)
 Recommend mucinex 600 mg tablet one by mouth twice daily x 10 days. Increase water intake.

## 2023-04-22 NOTE — Telephone Encounter (Signed)
 Left message on voicemail for patient to return call when available

## 2023-04-26 ENCOUNTER — Ambulatory Visit: Payer: Medicare Other

## 2023-04-26 ENCOUNTER — Ambulatory Visit: Payer: Medicare Other | Attending: Cardiology

## 2023-04-26 NOTE — Progress Notes (Deleted)
 Office Visit    Patient Name: Ashley Johns Date of Encounter: 04/26/2023  Primary Care Provider:  Caesar Bookman, NP Primary Cardiologist:  Maisie Fus, MD  Chief Complaint    Hypertension  Significant Past Medical History                    Allergies  Allergen Reactions   Codeine Nausea Only    unknown   Tramadol Nausea Only   Aspirin Rash    History of Present Illness    Ashley Johns is a 88 y.o. female patient of Dr Carolan Clines, in the office today for hypertension evaluation.  Patient was seen in December by Dr. Wyline Mood, with a pressure of 180/98.  Carvedilol was increased to 6.25 and amlodipine to 5.   She was asked to monitor home readings and return in 3 months for follow up.  Patient noted that her home readings were much more in normal range than what she had in office.    Blood Pressure Goal:  130/80  Current Medications:  olmesartan 40 mg every day , amlodipine 5 mg every day, carvedilol 6.25 mg bid   Previously tried:  atenolol  Family Hx:     Social Hx:      Tobacco:  Alcohol:  Caffeine: Diet:      Exercise:   Home BP readings:      Adherence Assessment  Do you ever forget to take your medication? [] Yes [] No  Do you ever skip doses due to side effects? [] Yes [] No  Do you have trouble affording your medicines? [] Yes [] No  Are you ever unable to pick up your medication due to transportation difficulties? [] Yes [] No  Do you ever stop taking your medications because you don't believe they are helping? [] Yes [] No  Do you check your weight daily? [] Yes [] No   Adherence strategy: ***  Barriers to obtaining medications: ***     Accessory Clinical Findings    Lab Results  Component Value Date   CREATININE 0.85 02/17/2023   BUN 20 02/17/2023   NA 140 02/17/2023   K 4.5 02/17/2023   CL 102 02/17/2023   CO2 28 02/17/2023   Lab Results  Component Value Date   ALT 11 02/17/2023   AST 13 02/17/2023   ALKPHOS 111  10/16/2022   BILITOT 0.4 02/17/2023   Lab Results  Component Value Date   HGBA1C 6.3 (H) 02/17/2023    Home Medications    Current Outpatient Medications  Medication Sig Dispense Refill   acetaminophen (TYLENOL) 650 MG CR tablet Take 650 mg by mouth every 8 (eight) hours as needed for pain.     albuterol (VENTOLIN HFA) 108 (90 Base) MCG/ACT inhaler Inhale 2 puffs into the lungs every 4 (four) hours as needed for wheezing or shortness of breath (coughing fits). 18 g 1   albuterol (VENTOLIN HFA) 108 (90 Base) MCG/ACT inhaler Inhale 2 puffs into the lungs every 6 (six) hours as needed for wheezing or shortness of breath. 8 g 2   amLODipine (NORVASC) 10 MG tablet Take 1 tablet (10 mg total) by mouth daily. 180 tablet 3   atorvastatin (LIPITOR) 20 MG tablet Take 1 tablet (20 mg total) by mouth daily. 90 tablet 1   benzonatate (TESSALON) 200 MG capsule Take 1 capsule (200 mg total) by mouth 2 (two) times daily as needed for cough. 20 capsule 0   carvedilol (COREG) 6.25 MG tablet Take 1 tablet (6.25 mg total)  by mouth 2 (two) times daily. 180 tablet 3   cetirizine (ZYRTEC) 10 MG tablet Take 10 mg by mouth as needed for allergies.     Dextromethorphan-guaiFENesin (MUCINEX DM PO) Take 2 capsules by mouth as needed.     EPINEPHrine (EPIPEN 2-PAK) 0.3 mg/0.3 mL IJ SOAJ injection Inject 0.3 mg into the muscle as needed. Inject 0.3mg  intramuscular route once as needed. 1 each 1   fluticasone (FLONASE) 50 MCG/ACT nasal spray SHAKE LIQUID AND USE 1 SPRAY IN EACH NOSTRIL TWICE DAILY AS NEEDED FOR NASAL CONGESTION 16 g 5   meloxicam (MOBIC) 7.5 MG tablet Take 1 tablet (7.5 mg total) by mouth daily as needed for pain. May increase to two daily if ineffective and notify provider 60 tablet 1   mometasone (ELOCON) 0.1 % cream Apply 1 Application topically daily as needed. 45 g 3   montelukast (SINGULAIR) 10 MG tablet TAKE 1 TABLET(10 MG) BY MOUTH AT BEDTIME 90 tablet 1   olmesartan (BENICAR) 40 MG tablet Take 1  tablet (40 mg total) by mouth daily. 90 tablet 3   Tiotropium Bromide Monohydrate (SPIRIVA RESPIMAT) 1.25 MCG/ACT AERS Inhale 2 puffs into the lungs daily. 4 g 5   No current facility-administered medications for this visit.     No BP recorded.  {Refresh Note OR Click here to enter BP  :1}***   Assessment & Plan    No problem-specific Assessment & Plan notes found for this encounter.   Phillips Hay PharmD CPP [facility]  [address] [place], [place] [address] [phone]

## 2023-05-21 NOTE — Progress Notes (Signed)
 Cardiology Office Note:  .   Date:  05/24/2023  ID:  Ashley Johns, DOB 28-Apr-1935, MRN [id number] PCP: Estil Heman, NP  [facility] Providers Cardiologist:  Bridgette Campus, MD    Patient Profile: .      PMH Hypertension Graves disease s/p thyroidectomy Palpitations Aortic valve calcification with mild to moderate stenosis Mitral valve regurgitation  Referred to cardiology and seen by Dr. Amanda Jungling 04/2021 for shortness of breath.  She reported for the previous 2 to 3 months she will get SOB with walking upstairs.  She did note some chest pressure with walking.  Symptoms resolved with rest.  No history of previous cardiac testing.  No evidence of volume overload on exam.  BP was elevated.  She was on olmesartan 40 mg daily and atenolol which was changed to carvedilol.  Echo 05/21/2021 revealed mild LV dysfunction with LVEF 50 to 55%, G1 DD, moderate hypokinesis of left ventricle, basal-mid inferolateral wall, normal RV, mild to moderate MR, moderate calcification of aortic valve with mild to moderate stenosis with mean gradient 10 mmHg.  Nuclear stress test 05/21/2021 with low risk with mildly reduced LVEF 54%, no ischemia or infarction.  Cardiac monitor completed 02/11/2022 revealed frequent brief SVT corresponding with triggered events, predominant underlying rhythm sinus rhythm, average HR 83 bpm, longest interval SVT lasting 11.6 seconds.  He was advised to continue carvedilol.  Last cardiology clinic visit was 01/26/2023 at which time she reported loss of consciousness September 2024.  Upon arrival in the ED O2 sats were in the upper 80s, she was tachypneic, and BP was 208/132.  BNP 127.  Chest CT negative for acute findings.  Head CT negative for stroke.  No signs of infection or report of arrhythmia. SOB had improved with walking in warmer weather.  Reported she was quite distressed because one of her church members have passed away.  Amlodipine 5 mg daily was started and she was  advised to return for hypertension follow-up.       History of Present Illness: .    History of Present Illness Ashley Johns is a very pleasant 88 y.o. female who is here today for follow-up of hypertension. She reports left leg swelling that is worse at night and improves by morning. She reports no associated pain. Her daughter and granddaughter are with her today. Her daughter has given her compression socks but she finds them difficult to put on. She continues to experience shortness of breath, particularly when climbing stairs or walking long distances. This has been a stable symptom for several years. She sleeps with two pillows and occasionally wakes up during the night coughing. She reports occasional tightness or heaviness in her abdomen. Despite these symptoms, the patient reports feeling well overall and maintains a level of independence, including driving and doing housework. She has noticed some weight loss, which she attributes to occasionally forgetting to eat. No chest pain, palpitations, PND, presyncope, or syncope.    Discussed the use of AI scribe software for clinical note transcription with the patient, who gave verbal consent to proceed.   ROS: + SOB       Studies Reviewed: Aaron Aas        No results found for: "LIPOA"   Risk Assessment/Calculations:             Physical Exam:   VS:  BP 122/70 (BP Location: Left Arm, Patient Position: Sitting, Cuff Size: Normal)   Pulse 90   Ht 5\' 1"  (1.549  m)   Wt 170 lb 9.6 oz (77.4 kg)   SpO2 95%   BMI 32.23 kg/m    Wt Readings from Last 3 Encounters:  05/24/23 170 lb 9.6 oz (77.4 kg)  04/14/23 173 lb (78.5 kg)  02/17/23 173 lb 12.8 oz (78.8 kg)    GEN: Hard of hearing, Well nourished, well developed in no acute distress NECK: No JVD; No carotid bruits CARDIAC: RRR, no murmurs, rubs, gallops RESPIRATORY:  Clear to auscultation without rales, wheezing or rhonchi  ABDOMEN: Soft, non-tender, non-distended EXTREMITIES:   Non-pitting edema LLE; No deformity     ASSESSMENT AND PLAN: .    Assessment & Plan Shortness of breath   She experiences dyspnea on exertion and nocturnal dyspnea, most often with going up her stairs in her home or walking long distances. She can complete housework and ADLs without significant SOB. We will update ehocardiogram to assess cardiac function and its contribution to dyspnea and leg swelling.  Leg swelling   Likely secondary to increased amlodipine dosage. Recommend compression stockings with a Donner device and advise leg elevation to reduce edema. Mildly reduced LVEF and G1DD on echo 05/21/2021. We are updating echo as noted above. Low sodium diet advised.   Aortic valve calcification   Moderate calcification of aortic valve with mild to moderate AS on echo 05/2021. We will get echocardiogram to reassess aortic valve calcification.  Palpitations Quiescent at this time. Continue carvedilol.  Hypertension   Blood pressure is well-controlled with the current regimen. Stable renal function on labs 02/17/23. Continue current antihypertensive regimen with carvedilol, amlodipine, olmesartan.  Hyperlipidemia   Cholesterol remains elevated despite reported compliance with atorvastatin 20 mg daily. She admits to lack of exercise and dietary habits that may contribute. Encourage regular exercise, such as walking or Silver Sneakers classes, and provide dietary guidance to reduce saturated fat intake. Recheck cholesterol levels in three months.        Disposition:1 year with Dr. Veryl Gottron or APP  Signed, Slater Duncan, NP-C

## 2023-05-24 ENCOUNTER — Ambulatory Visit (HOSPITAL_BASED_OUTPATIENT_CLINIC_OR_DEPARTMENT_OTHER): Admitting: Nurse Practitioner

## 2023-05-24 ENCOUNTER — Encounter (HOSPITAL_BASED_OUTPATIENT_CLINIC_OR_DEPARTMENT_OTHER): Payer: Self-pay | Admitting: Nurse Practitioner

## 2023-05-24 VITALS — BP 122/70 | HR 90 | Ht 61.0 in | Wt 170.6 lb

## 2023-05-24 DIAGNOSIS — M7989 Other specified soft tissue disorders: Secondary | ICD-10-CM | POA: Diagnosis not present

## 2023-05-24 DIAGNOSIS — R002 Palpitations: Secondary | ICD-10-CM | POA: Diagnosis not present

## 2023-05-24 DIAGNOSIS — I350 Nonrheumatic aortic (valve) stenosis: Secondary | ICD-10-CM | POA: Diagnosis not present

## 2023-05-24 DIAGNOSIS — I10 Essential (primary) hypertension: Secondary | ICD-10-CM | POA: Diagnosis not present

## 2023-05-24 DIAGNOSIS — E785 Hyperlipidemia, unspecified: Secondary | ICD-10-CM

## 2023-05-24 DIAGNOSIS — R0609 Other forms of dyspnea: Secondary | ICD-10-CM

## 2023-05-24 NOTE — Patient Instructions (Addendum)
 Medication Instructions:   Your physician recommends that you continue on your current medications as directed. Please refer to the Current Medication list given to you today.   *If you need a refill on your cardiac medications before your next appointment, please call your pharmacy*  Lab Work:  Your physician recommends that you return for a FASTING lipid profile/alt in 3 months fasting after midnight in July. Paperwork given to patient today.    If you have labs (blood work) drawn today and your tests are completely normal, you will receive your results only by: MyChart Message (if you have MyChart) OR A paper copy in the mail If you have any lab test that is abnormal or we need to change your treatment, we will call you to review the results.  Testing/Procedures:  Your physician has requested that you have an echocardiogram. Echocardiography is a painless test that uses sound waves to create images of your heart. It provides your doctor with information about the size and shape of your heart and how well your heart's chambers and valves are working. This procedure takes approximately one hour. There are no restrictions for this procedure. Please do NOT wear cologne, perfume, aftershave, or lotions (deodorant is allowed). Please arrive 15 minutes prior to your appointment time.  Please note: We ask at that you not bring children with you during ultrasound (echo/ vascular) testing. Due to room size and safety concerns, children are not allowed in the ultrasound rooms during exams. Our front office staff cannot provide observation of children in our lobby area while testing is being conducted. An adult accompanying a patient to their appointment will only be allowed in the ultrasound room at the discretion of the ultrasound technician under special circumstances. We apologize for any inconvenience.   Follow-Up: At [facility], you and your health needs are our priority.  As part  of our continuing mission to provide you with exceptional heart care, our providers are all part of one team.  This team includes your primary Cardiologist (physician) and Advanced Practice Providers or APPs (Physician Assistants and Nurse Practitioners) who all work together to provide you with the care you need, when you need it.  Your next appointment:   1 year(s)  Provider:   Sheryle Donning, MD, Slater Duncan, NP, or Neomi Banks, NP    We recommend signing up for the patient portal called "MyChart".  Sign up information is provided on this After Visit Summary.  MyChart is used to connect with patients for Virtual Visits (Telemedicine).  Patients are able to view lab/test results, encounter notes, upcoming appointments, etc.  Non-urgent messages can be sent to your provider as well.   To learn more about what you can do with MyChart, go to [email or web address].   Other Instructions  Your physician wants you to follow-up in: 1 year.  You will receive a reminder letter in the mail two months in advance. If you don't receive a letter, please call our office to schedule the follow-up appointment.  Adopting a Healthy Lifestyle.   Weight: Know what a healthy weight is for you (roughly BMI <25) and aim to maintain this. You can calculate your body mass index on your smart phone. Unfortunately, this is not the most accurate measure of healthy weight, but it is the simplest measurement to use. A more accurate measurement involves body scanning which measures lean muscle, fat tissue and bony density. We do not have this equipment at [facility].    Diet: Aim  for 7+ servings of fruits and vegetables daily Limit animal fats in diet for cholesterol and heart health - choose grass fed whenever available Avoid highly processed foods (fast food burgers, tacos, fried chicken, pizza, hot dogs, french fries)  Saturated fat comes in the form of butter, lard, coconut oil, margarine, partially  hydrogenated oils, and fat in meat. These increase your risk of cardiovascular disease.  Use healthy plant oils, such as olive, canola, soy, corn, sunflower and peanut.  Whole foods such as fruits, vegetables and whole grains have fiber  Men need > 38 grams of fiber per day Women need > 25 grams of fiber per day  Load up on vegetables and fruits - one-half of your plate: Aim for color and variety, and remember that potatoes dont count. Go for whole grains - one-quarter of your plate: Whole wheat, barley, wheat berries, quinoa, oats, brown rice, and foods made with them. If you want pasta, go with whole wheat pasta. Protein power - one-quarter of your plate: Fish, chicken, beans, and nuts are all healthy, versatile protein sources. Limit red meat. You need carbohydrates for energy! The type of carbohydrate is more important than the amount. Choose carbohydrates such as vegetables, fruits, whole grains, beans, and nuts in the place of white rice, white pasta, potatoes (baked or fried), macaroni and cheese, cakes, cookies, and donuts.  If youre thirsty, drink water. Coffee and tea are good in moderation, but skip sugary drinks and limit milk and dairy products to one or two daily servings. Keep sugar intake at 6 teaspoons or 24 grams or LESS       Exercise: Aim for 150 min of moderate intensity exercise weekly for heart health, and weights twice weekly for bone health Stay active - any steps are better than no steps! Aim for 7-9 hours of sleep daily          Mediterranean Diet  Why follow it? Research shows. Those who follow the Mediterranean diet have a reduced risk of heart disease  The diet is associated with a reduced incidence of Parkinson's and Alzheimer's diseases People following the diet may have longer life expectancies and lower rates of chronic diseases  The Dietary Guidelines for Americans recommends the Mediterranean diet as an eating plan to promote health and prevent  disease  What Is the Mediterranean Diet?  Healthy eating plan based on typical foods and recipes of Mediterranean-style cooking The diet is primarily a plant based diet; these foods should make up a majority of meals   Starches - Plant based foods should make up a majority of meals - They are an important sources of vitamins, minerals, energy, antioxidants, and fiber - Choose whole grains, foods high in fiber and minimally processed items  - Typical grain sources include wheat, oats, barley, corn, brown rice, bulgar, farro, millet, polenta, couscous  - Various types of beans include chickpeas, lentils, fava beans, black beans, white beans   Fruits  Veggies - Large quantities of antioxidant rich fruits & veggies; 6 or more servings  - Vegetables can be eaten raw or lightly drizzled with oil and cooked  - Vegetables common to the traditional Mediterranean Diet include: artichokes, arugula, beets, broccoli, brussel sprouts, cabbage, carrots, celery, collard greens, cucumbers, eggplant, kale, leeks, lemons, lettuce, mushrooms, okra, onions, peas, peppers, potatoes, pumpkin, radishes, rutabaga, shallots, spinach, sweet potatoes, turnips, zucchini - Fruits common to the Mediterranean Diet include: apples, apricots, avocados, cherries, clementines, dates, figs, grapefruits, grapes, melons, nectarines, oranges, peaches, pears, pomegranates,  strawberries, tangerines  Fats - Replace butter and margarine with healthy oils, such as olive oil, canola oil, and tahini  - Limit nuts to no more than a handful a day  - Nuts include walnuts, almonds, pecans, pistachios, pine nuts  - Limit or avoid candied, honey roasted or heavily salted nuts - Olives are central to the Mediterranean diet - can be eaten whole or used in a variety of dishes   Meats Protein - Limiting red meat: no more than a few times a month - When eating red meat: choose lean cuts and keep the portion to the size of deck of cards - Eggs: approx.  0 to 4 times a week  - Fish and lean poultry: at least 2 a week  - Healthy protein sources include, chicken, [place], lean beef, lamb - Increase intake of seafood such as tuna, salmon, trout, mackerel, shrimp, scallops - Avoid or limit high fat processed meats such as sausage and bacon  Dairy - Include moderate amounts of low fat dairy products  - Focus on healthy dairy such as fat free yogurt, skim milk, low or reduced fat cheese - Limit dairy products higher in fat such as whole or 2% milk, cheese, ice cream  Alcohol - Moderate amounts of red wine is ok  - No more than 5 oz daily for women (all ages) and men older than age 49  - No more than 10 oz of wine daily for men younger than 45  Other - Limit sweets and other desserts  - Use herbs and spices instead of salt to flavor foods  - Herbs and spices common to the traditional Mediterranean Diet include: basil, bay leaves, chives, cloves, cumin, fennel, garlic, lavender, marjoram, mint, oregano, parsley, pepper, rosemary, sage, savory, sumac, tarragon, thyme   It's not just a diet, it's a lifestyle:  The Mediterranean diet includes lifestyle factors typical of those in the region  Foods, drinks and meals are best eaten with others and savored Daily physical activity is important for overall good health This could be strenuous exercise like running and aerobics This could also be more leisurely activities such as walking, housework, yard-work, or taking the stairs Moderation is the key; a balanced and healthy diet accommodates most foods and drinks Consider portion sizes and frequency of consumption of certain foods   Meal Ideas & Options:  Breakfast:  Whole wheat toast or whole wheat English muffins with peanut butter & hard boiled egg Steel cut oats topped with apples & cinnamon and skim milk  Fresh fruit: banana, strawberries, melon, berries, peaches  Smoothies: strawberries, bananas, greek yogurt, peanut butter Low fat greek yogurt  with blueberries and granola  Egg white omelet with spinach and mushrooms Breakfast couscous: whole wheat couscous, apricots, skim milk, cranberries  Sandwiches:  Hummus and grilled vegetables (peppers, zucchini, squash) on whole wheat bread   Grilled chicken on whole wheat pita with lettuce, tomatoes, cucumbers or tzatziki  [place] salad on whole wheat bread: tuna salad made with greek yogurt, olives, red peppers, capers, green onions Garlic rosemary lamb pita: lamb sauted with garlic, rosemary, salt & pepper; add lettuce, cucumber, greek yogurt to pita - flavor with lemon juice and black pepper  Seafood:  Mediterranean grilled salmon, seasoned with garlic, basil, parsley, lemon juice and black pepper Shrimp, lemon, and spinach whole-grain pasta salad made with low fat greek yogurt  Seared scallops with lemon orzo  Seared tuna steaks seasoned salt, pepper, coriander topped with tomato mixture of olives,  tomatoes, olive oil, minced garlic, parsley, green onions and cappers  Meats:  Herbed greek chicken salad with kalamata olives, cucumber, feta  Red bell peppers stuffed with spinach, bulgur, lean ground beef (or lentils) & topped with feta   Kebabs: skewers of chicken, tomatoes, onions, zucchini, squash  [place] burgers: made with red onions, mint, dill, lemon juice, feta cheese topped with roasted red peppers Vegetarian Cucumber salad: cucumbers, artichoke hearts, celery, red onion, feta cheese, tossed in olive oil & lemon juice  Hummus and whole grain pita points with a greek salad (lettuce, tomato, feta, olives, cucumbers, red onion) Lentil soup with celery, carrots made with vegetable broth, garlic, salt and pepper  Tabouli salad: parsley, bulgur, mint, scallions, cucumbers, tomato, radishes, lemon juice, olive oil, salt and pepper.

## 2023-05-27 ENCOUNTER — Encounter: Payer: Self-pay | Admitting: Family Medicine

## 2023-05-27 ENCOUNTER — Other Ambulatory Visit: Payer: Self-pay

## 2023-05-27 ENCOUNTER — Ambulatory Visit: Admitting: Family Medicine

## 2023-05-27 VITALS — BP 128/80 | HR 89 | Temp 99.0°F | Resp 16 | Ht 61.0 in | Wt 170.6 lb

## 2023-05-27 DIAGNOSIS — R062 Wheezing: Secondary | ICD-10-CM | POA: Diagnosis not present

## 2023-05-27 DIAGNOSIS — R0609 Other forms of dyspnea: Secondary | ICD-10-CM | POA: Diagnosis not present

## 2023-05-27 MED ORDER — BENZONATATE 100 MG PO CAPS: 100.0000 mg | ORAL_CAPSULE | Freq: Three times a day (TID) | ORAL | 2 refills | Status: DC | PRN

## 2023-05-27 MED ORDER — MONTELUKAST SODIUM 10 MG PO TABS: 10.0000 mg | ORAL_TABLET | Freq: Every day | ORAL | 1 refills | Status: DC

## 2023-05-27 MED ORDER — FLUTICASONE PROPIONATE 50 MCG/ACT NA SUSP: 2.0000 | Freq: Two times a day (BID) | NASAL | 5 refills | Status: DC | PRN

## 2023-05-27 MED ORDER — AZELASTINE HCL 0.1 % NA SOLN: 2.0000 | Freq: Two times a day (BID) | NASAL | 5 refills | Status: DC

## 2023-05-27 MED ORDER — SPIRIVA RESPIMAT 1.25 MCG/ACT IN AERS: 2.0000 | INHALATION_SPRAY | Freq: Every day | RESPIRATORY_TRACT | 5 refills | Status: DC

## 2023-05-27 MED ORDER — BUDESONIDE-FORMOTEROL FUMARATE 80-4.5 MCG/ACT IN AERO: 2.0000 | INHALATION_SPRAY | Freq: Two times a day (BID) | RESPIRATORY_TRACT | 12 refills | Status: DC

## 2023-05-27 MED ORDER — ALBUTEROL SULFATE HFA 108 (90 BASE) MCG/ACT IN AERS: 2.0000 | INHALATION_SPRAY | RESPIRATORY_TRACT | 1 refills | Status: DC | PRN

## 2023-05-27 MED ORDER — PREDNISONE 10 MG PO TABS: 10.0000 mg | ORAL_TABLET | Freq: Every day | ORAL | 0 refills | Status: AC

## 2023-05-27 NOTE — Patient Instructions (Addendum)
 Allergic rhinitis Continue allergen avoidance measures directed toward dust mite and cockroach as listed below Begin prednisone 10 mg once a day for the next 4 days, then stop Continue montelukast 10 mg once a day to control allergy symptoms Continue an antihistamine once a day if needed for runny nose or itch Continue Flonase 2 sprays in each nostril up to twice a day if needed for a stuffy nose.  In the right nostril, point the applicator out toward the right ear. In the left nostril, point the applicator out toward the left ear Begin azelastine 2 sprays in each nostril up to twice a day if needed for a runny nose Continue ipratropium (Atrovent) 1 to 2 sprays in each nostril up to twice a day if needed for runny nose Begin saline nasal rinses as needed for nasal symptoms. Use this before any medicated nasal sprays for best result  Cough/Dyspnea on exertion Begin tessalon Perles up to three times a day for cough Begin Symbicort 80-2 puffs twice a day with a spacer to prevent cough or wheeze Continue montelukast 10 mg once a day to prevent cough or wheeze Continue Spiriva 1.25 2 puffs once a day Continue albuterol 2 puffs once every 4 hours if needed for cough or wheeze You may use albuterol 2 puffs 5 to 15 minutes before activity to decrease cough or wheeze Call the clinic if your symptoms worsen or if you develop a fever and we will move forward with a chest xray  Rash Continue a twice a day moisturizing routine Continue mometasone to red and itchy areas once a day if needed. Do not use this medication longer than 2 weeks in a row If your symptoms re-occur, begin a journal of events that occurred for up to 6 hours before your symptoms began including foods and beverages consumed, soaps or perfumes you had contact with, and medications.   Call the clinic if this treatment plan is not working well for you.  Follow up in 2 months or sooner if needed.  Control of Dust Mite Allergen Dust  mites play a major role in allergic asthma and rhinitis. They occur in environments with high humidity wherever human skin is found. Dust mites absorb humidity from the atmosphere (ie, they do not drink) and feed on organic matter (including shed human and animal skin). Dust mites are a microscopic type of insect that you cannot see with the naked eye. High levels of dust mites have been detected from mattresses, pillows, carpets, upholstered furniture, bed covers, clothes, soft toys and any woven material. The principal allergen of the dust mite is found in its feces. A gram of dust may contain 1,000 mites and 250,000 fecal particles. Mite antigen is easily measured in the air during house cleaning activities. Dust mites do not bite and do not cause harm to humans, other than by triggering allergies/asthma.  Ways to decrease your exposure to dust mites in your home:  1. Encase mattresses, box springs and pillows with a mite-impermeable barrier or cover  2. Wash sheets, blankets and drapes weekly in hot water (130 F) with detergent and dry them in a dryer on the hot setting.  3. Have the room cleaned frequently with a vacuum cleaner and a damp dust-mop. For carpeting or rugs, vacuuming with a vacuum cleaner equipped with a high-efficiency particulate air (HEPA) filter. The dust mite allergic individual should not be in a room which is being cleaned and should wait 1 hour after cleaning before going  into the room.  4. Do not sleep on upholstered furniture (eg, couches).  5. If possible removing carpeting, upholstered furniture and drapery from the home is ideal. Horizontal blinds should be eliminated in the rooms where the person spends the most time (bedroom, study, television room). Washable vinyl, roller-type shades are optimal.  6. Remove all non-washable stuffed toys from the bedroom. Wash stuffed toys weekly like sheets and blankets above.  7. Reduce indoor humidity to less than 50%.  Inexpensive humidity monitors can be purchased at most hardware stores. Do not use a humidifier as can make the problem worse and are not recommended.  Control of Cockroach Allergen Cockroach allergen has been identified as an important cause of acute attacks of asthma, especially in urban settings.  There are fifty-five species of cockroach that exist in the [place] , however only three, the [place], [place] and [place] species produce allergen that can affect patients with Asthma.  Allergens can be obtained from fecal particles, egg casings and secretions from cockroaches.    Remove food sources. Reduce access to water. Seal access and entry points. Spray runways with 0.5-1% Diazinon or Chlorpyrifos Blow boric acid power under stoves and refrigerator. Place bait stations (hydramethylnon) at feeding sites.

## 2023-05-27 NOTE — Progress Notes (Signed)
 [address] [place] [address] Dept: [phone]  FOLLOW UP NOTE  Patient ID: Ashley Johns, female    DOB: 08/27/1935  Age: 88 y.o. MRN: [id number] Date of Office Visit: 05/27/2023  Assessment  Chief Complaint: Allergic Rhinitis  (Congestion and runny nose with clear mucus ) and Asthma (Some cough due to seasonal change / albuterol 1-2 times per day due to physical activity. )  HPI Ashley Johns is an 88 year old female who presents to the clinic for follow-up visit.  She was last seen in this clinic on 03/10/2022 by Dr. Selena Batten for evaluation of allergic rhinitis, dyspnea on exertion, and rash.  She is accompanied by her daughter who assists with history.    At today's visit, she reports that her allergic rhinitis has been poorly controlled over the last 2 to 3 months with symptoms including clear rhinorrhea, nasal congestion, sneezing, and postnasal drainage.  She continues an antihistamine, montelukast, Flonase, and ipratropium daily.  She does report poor Flonase application technique.  She has tried Mucinex 600 mg twice a day without relief of the symptoms.  She is not currently using a nasal saline rinse.  Her last environmental allergy skin testing was on 06/05/2021 but was positive to dust mite and cockroach.  She reports experiencing dry cough over the last 2 to 3 months.  She reports occasional shortness of breath with activity and denies wheeze with activity or rest.  She reports the cough is worse when she has been outside for long periods of time.  She continues albuterol infrequently and uses Spiriva about once a week.  Her last chest x-ray on 04/14/2023 indicated no active cardiopulmonary disease, however, did mention COPD changes.  She reports a red, raised, and itchy rash that comes and goes on her right arm and right ankle.  She reports this rash mainly occurs during season changes.  She continues a twice a day moisturizing routine and occasionally uses mometasone with  relief of symptoms.  Her current medications are listed in the chart.  Drug Allergies:  Allergies  Allergen Reactions   Codeine Nausea Only    unknown   Tramadol Nausea Only   Aspirin Rash    Physical Exam: BP 128/80 (BP Location: Right Arm, Patient Position: Sitting, Cuff Size: Normal)   Pulse 89   Temp 99 F (37.2 C) (Temporal)   Resp 16   Ht 5\' 1"  (1.549 m)   Wt 170 lb 10.2 oz (77.4 kg)   SpO2 95%   BMI 32.24 kg/m    Physical Exam  Diagnostics: FVC 0.83 which is 45% of predicted value, FEV1 0.65 which is 46% of predicted value.  Spirometry indicates possible restriction.  Assessment and Plan: 1. Exertional dyspnea   2. Wheezing     Meds ordered this encounter  Medications   albuterol (VENTOLIN HFA) 108 (90 Base) MCG/ACT inhaler    Sig: Inhale 2 puffs into the lungs every 4 (four) hours as needed for wheezing or shortness of breath (coughing fits).    Dispense:  18 g    Refill:  1   montelukast (SINGULAIR) 10 MG tablet    Sig: Take 1 tablet (10 mg total) by mouth at bedtime.    Dispense:  90 tablet    Refill:  1   azelastine (ASTELIN) 0.1 % nasal spray    Sig: Place 2 sprays into both nostrils 2 (two) times daily. Use in each nostril as directed    Dispense:  30 mL  Refill:  5   predniSONE (DELTASONE) 10 MG tablet    Sig: Take 1 tablet (10 mg total) by mouth daily with breakfast for 4 days.    Dispense:  4 tablet    Refill:  0   benzonatate (TESSALON PERLES) 100 MG capsule    Sig: Take 1 capsule (100 mg total) by mouth 3 (three) times daily as needed for cough.    Dispense:  90 capsule    Refill:  2   fluticasone (FLONASE) 50 MCG/ACT nasal spray    Sig: Place 2 sprays into both nostrils 2 (two) times daily as needed for allergies or rhinitis.    Dispense:  16 g    Refill:  5   budesonide-formoterol (SYMBICORT) 80-4.5 MCG/ACT inhaler    Sig: Inhale 2 puffs into the lungs 2 (two) times daily.    Dispense:  1 each    Refill:  12   Tiotropium Bromide  Monohydrate (SPIRIVA RESPIMAT) 1.25 MCG/ACT AERS    Sig: Inhale 2 puffs into the lungs daily.    Dispense:  4 g    Refill:  5    Patient Instructions  Allergic rhinitis Continue allergen avoidance measures directed toward dust mite and cockroach as listed below Begin prednisone 10 mg once a day for the next 4 days, then stop Continue montelukast 10 mg once a day to control allergy symptoms Continue an antihistamine once a day if needed for runny nose or itch Continue Flonase 2 sprays in each nostril up to twice a day if needed for a stuffy nose.  In the right nostril, point the applicator out toward the right ear. In the left nostril, point the applicator out toward the left ear Begin azelastine 2 sprays in each nostril up to twice a day if needed for a runny nose Continue ipratropium (Atrovent) 1 to 2 sprays in each nostril up to twice a day if needed for runny nose Begin saline nasal rinses as needed for nasal symptoms. Use this before any medicated nasal sprays for best result  Cough/Dyspnea on exertion Begin tessalon Perles up to three times a day for cough Begin Symbicort 80-2 puffs twice a day with a spacer to prevent cough or wheeze Continue montelukast 10 mg once a day to prevent cough or wheeze Continue Spiriva 1.25 2 puffs once a day Continue albuterol 2 puffs once every 4 hours if needed for cough or wheeze You may use albuterol 2 puffs 5 to 15 minutes before activity to decrease cough or wheeze Call the clinic if your symptoms worsen or if you develop a fever and we will move forward with a chest xray  Rash Continue a twice a day moisturizing routine Continue mometasone to red and itchy areas once a day if needed. Do not use this medication longer than 2 weeks in a row If your symptoms re-occur, begin a journal of events that occurred for up to 6 hours before your symptoms began including foods and beverages consumed, soaps or perfumes you had contact with, and medications.    Call the clinic if this treatment plan is not working well for you.  Follow up in 2 months or sooner if needed.   Return in about 2 months (around 07/27/2023), or if symptoms worsen or fail to improve.    Thank you for the opportunity to care for this patient.  Please do not hesitate to contact me with questions.  Thermon Leyland, FNP [facility]

## 2023-06-03 ENCOUNTER — Ambulatory Visit: Attending: Cardiology | Admitting: Pharmacist Clinician (PhC)/ Clinical Pharmacy Specialist

## 2023-06-03 ENCOUNTER — Encounter: Payer: Self-pay | Admitting: Pharmacist Clinician (PhC)/ Clinical Pharmacy Specialist

## 2023-06-03 ENCOUNTER — Ambulatory Visit: Admitting: Pharmacist Clinician (PhC)/ Clinical Pharmacy Specialist

## 2023-06-03 VITALS — BP 135/76 | HR 76

## 2023-06-03 DIAGNOSIS — I10 Essential (primary) hypertension: Secondary | ICD-10-CM | POA: Diagnosis not present

## 2023-06-03 NOTE — Patient Instructions (Signed)
 Follow up appointment: you will see Dr. Veryl Gottron at the [facility] office in the future.  You should make an appointment after the first of the new year  Take your BP meds as follows: no changes to medications today.  Check your blood pressure at home daily (if able) and keep record of the readings.  Your blood pressure goal is < 130/80  To check your pressure at home you will need to:  1. Sit up in a chair, with feet flat on the floor and back supported. Do not cross your ankles or legs. 2. Rest your left arm so that the cuff is about heart level. If the cuff goes on your upper arm,  then just relax the arm on the table, arm of the chair or your lap. If you have a wrist cuff, we  suggest relaxing your wrist against your chest (think of it as Pledging the Flag with the  wrong arm).  3. Place the cuff snugly around your arm, about 1 inch above the crook of your elbow. The  cords should be inside the groove of your elbow.  4. Sit quietly, with the cuff in place, for about 5 minutes. After that 5 minutes press the power  button to start a reading. 5. Do not talk or move while the reading is taking place.  6. Record your readings on a sheet of paper. Although most cuffs have a memory, it is often  easier to see a pattern developing when the numbers are all in front of you.  7. You can repeat the reading after 1-3 minutes if it is recommended  Make sure your bladder is empty and you have not had caffeine or tobacco within the last 30 min  Always bring your blood pressure log with you to your appointments. If you have not brought your monitor in to be double checked for accuracy, please bring it to your next appointment.  You can find a list of quality blood pressure cuffs at [email or web address]  Important lifestyle changes to control high blood pressure  Intervention  Effect on the BP  Lose extra pounds and watch your waistline Weight loss is one of the most effective lifestyle changes for  controlling blood pressure. If you're overweight or obese, losing even a small amount of weight can help reduce blood pressure. Blood pressure might go down by about 1 millimeter of mercury (mm Hg) with each kilogram (about 2.2 pounds) of weight lost.  Exercise regularly As a general goal, aim for at least 30 minutes of moderate physical activity every day. Regular physical activity can lower high blood pressure by about 5 to 8 mm Hg.  Eat a healthy diet Eating a diet rich in whole grains, fruits, vegetables, and low-fat dairy products and low in saturated fat and cholesterol. A healthy diet can lower high blood pressure by up to 11 mm Hg.  Reduce salt (sodium) in your diet Even a small reduction of sodium in the diet can improve heart health and reduce high blood pressure by about 5 to 6 mm Hg.  Limit alcohol One drink equals 12 ounces of beer, 5 ounces of wine, or 1.5 ounces of 80-proof liquor.  Limiting alcohol to less than one drink a day for women or two drinks a day for men can help lower blood pressure by about 4 mm Hg.   If you have any questions or concerns please use My Chart to send questions or call the office at [phone]

## 2023-06-03 NOTE — Assessment & Plan Note (Signed)
 Assessment: BP is uncontrolled in office BP 135/76 mmHg;  above the goal (<130/80). BP was 122/70 when she was seen by Slater Duncan earlier this month Tolerates olmesartan , amlodipine , carvedilol  well, without any side effects Denies SOB, palpitation, chest pain, headaches Some edema in lower legs, notes that started in the past month Reiterated the importance of regular exercise and low salt diet   Plan:  Continue taking amlodipine  20 mg every day, carvedilol  6.25 mg bid, olmesartan  40 mg every day  Patient to keep record of BP readings with heart rate and report to us  at the next visit Patient to follow up with Dr. Veryl Gottron in 05/2024  Labs ordered today:  none

## 2023-06-03 NOTE — Progress Notes (Signed)
 Office Visit    Patient Name: Ashley Johns Date of Encounter: 06/03/2023  Primary Care Provider:  Estil Heman, NP Primary Cardiologist:  Bridgette Campus, MD  Chief Complaint    Hypertension  Significant Past Medical History   HLD 1/25 LDL 134  AF CHADS2-VASc = 4, not on anticoagulation  preDM 1/25 A1c 6.3    Allergies  Allergen Reactions   Codeine Nausea Only    unknown   Tramadol Nausea Only   Aspirin Rash    History of Present Illness    Ashley Johns is a 88 y.o. female patient of Dr Ashley Johns, in the office today for hypertension evaluation.   She saw Dr. Amanda Johns in December, at which time her pressure was elevated at 180/98.  She increased amlodipine  to 10 mg.   A visit with PCP in January showed improved but still elevated BP.  At her last 3 visits with various providers (March and April), all showed BP to be < 130/80  Today she is in the office with her daughter.  Notes home readings all 120 systolic range, with only occasional readings in the 130's.  No readings with her today.  No concerns with medication and states compliance.   Blood Pressure Goal:  130/80  Current Medications:  amlodipine  20 mg every day, carvedilol  6.25 mg bid, olmesartan  40 mg every day   Social Hx:      Tobacco: no  Alcohol: no  Caffeine: coffee daily, home brewed  Diet:    eats variety of proteins, cooks Sunday dinner most weeks; plenty of vegetables and meats, snacks on chops/crackers; does add salt to foods if cooking for her family, but Ashley Johns if it's just for her.    Exercise: none  Home BP readings:  no readings, states all 120's, with only occasional 130 systolic  Accessory Clinical Findings    Lab Results  Component Value Date   CREATININE 0.85 02/17/2023   BUN 20 02/17/2023   NA 140 02/17/2023   K 4.5 02/17/2023   CL 102 02/17/2023   CO2 28 02/17/2023   Lab Results  Component Value Date   ALT 11 02/17/2023   AST 13 02/17/2023   ALKPHOS 111 10/16/2022    BILITOT 0.4 02/17/2023   Lab Results  Component Value Date   HGBA1C 6.3 (H) 02/17/2023    Home Medications    Current Outpatient Medications  Medication Sig Dispense Refill   albuterol  (VENTOLIN  HFA) 108 (90 Base) MCG/ACT inhaler Inhale 2 puffs into the lungs every 4 (four) hours as needed for wheezing or shortness of breath (coughing fits). 18 g 1   amLODipine  (NORVASC ) 10 MG tablet Take 1 tablet (10 mg total) by mouth daily. 180 tablet 3   atorvastatin  (LIPITOR) 20 MG tablet Take 1 tablet (20 mg total) by mouth daily. 90 tablet 1   carvedilol  (COREG ) 6.25 MG tablet Take 1 tablet (6.25 mg total) by mouth 2 (two) times daily. 180 tablet 3   fluticasone  (FLONASE ) 50 MCG/ACT nasal spray Place 2 sprays into both nostrils 2 (two) times daily as needed for allergies or rhinitis. 16 g 5   olmesartan  (BENICAR ) 40 MG tablet Take 1 tablet (40 mg total) by mouth daily. 90 tablet 3   acetaminophen  (TYLENOL ) 650 MG CR tablet Take 650 mg by mouth every 8 (eight) hours as needed for pain.     azelastine  (ASTELIN ) 0.1 % nasal spray Place 2 sprays into both nostrils 2 (two) times daily. Use  in each nostril as directed 30 mL 5   benzonatate  (TESSALON  PERLES) 100 MG capsule Take 1 capsule (100 mg total) by mouth 3 (three) times daily as needed for cough. 90 capsule 2   budesonide -formoterol  (SYMBICORT ) 80-4.5 MCG/ACT inhaler Inhale 2 puffs into the lungs 2 (two) times daily. 1 each 12   Dextromethorphan-guaiFENesin  (MUCINEX  DM PO) Take 2 capsules by mouth as needed.     EPINEPHrine  (EPIPEN  2-PAK) 0.3 mg/0.3 mL IJ SOAJ injection Inject 0.3 mg into the muscle as needed. Inject 0.3mg  intramuscular route once as needed. 1 each 1   meloxicam  (MOBIC ) 7.5 MG tablet Take 1 tablet (7.5 mg total) by mouth daily as needed for pain. May increase to two daily if ineffective and notify provider 60 tablet 1   mometasone  (ELOCON ) 0.1 % cream Apply 1 Application topically daily as needed. 45 g 3   montelukast  (SINGULAIR ) 10  MG tablet Take 1 tablet (10 mg total) by mouth at bedtime. 90 tablet 1   Tiotropium Bromide Monohydrate  (SPIRIVA  RESPIMAT) 1.25 MCG/ACT AERS Inhale 2 puffs into the lungs daily. 4 g 5   No current facility-administered medications for this visit.         Assessment & Plan    Essential hypertension Assessment: BP is uncontrolled in office BP 135/76 mmHg;  above the goal (<130/80). BP was 122/70 when she was seen by Ashley Johns earlier this month Tolerates olmesartan , amlodipine , carvedilol  well, without any side effects Denies SOB, palpitation, chest pain, headaches Some edema in lower legs, notes that started in the past month Reiterated the importance of regular exercise and low salt diet   Plan:  Continue taking amlodipine  20 mg every day, carvedilol  6.25 mg bid, olmesartan  40 mg every day  Patient to keep record of BP readings with heart rate and report to us  at the next visit Patient to follow up with Dr. Veryl Johns in 05/2024  Labs ordered today:  none   Donivan Furry PharmD CPP [facility]  [address] [place], [place] [address] [phone]

## 2023-06-08 ENCOUNTER — Ambulatory Visit: Payer: Self-pay

## 2023-06-08 NOTE — Telephone Encounter (Signed)
 Message routed to [place], NP as FYI. Patient schedule to see you tomorrow 06/09/2023.

## 2023-06-08 NOTE — Telephone Encounter (Signed)
  Chief Complaint: leg swelling Symptoms: bilateral leg swelling. Left leg swelled first and now right leg is swelling Frequency: one week Pertinent Negatives: Patient denies CP, SOB Disposition: [] ED /[] Urgent Care (no appt availability in office) / [x] Appointment(In office/virtual)/ []  [facility]/ [] Home Care/ [] Refused Recommended Disposition /[] [facility]/ []  Follow-up with PCP Additional Notes: patient calling with concerns for bilateral leg swelling. Patient states left leg was swelling first. Patient was seen at cardiology office and the swelling was evaluated. Patient was told that MD was going to watch and see. Patient reports right leg started to swell a few days ago. Patient denies any pain or redness to the legs. Denies CP or SOB. Patient is requesting to be seen in the office. Patient is recommended to be seen within 24 hours. PCP unavailable within protocol time period. Patient scheduled with another provider in office. Scheduled appointment for tomorrow at 9:20 AM. Patient verbalized understanding of plan and all questions answered.     Copied from CRM [id number]. Topic: Clinical - Red Word Triage >> Jun 08, 2023  2:53 PM Brynn Caras wrote: Red Word that prompted transfer to Nurse Triage: Both legs are swollen, pt states she spoke with her heart doctor about this previously but the swelling is getting worse. Reason for Disposition  [1] MODERATE leg swelling (e.g., swelling extends up to knees) AND [2] new-onset or worsening  Answer Assessment - Initial Assessment Questions 1. ONSET: "When did the swelling start?" (e.g., minutes, hours, days)     Started a week ago 2. LOCATION: "What part of the leg is swollen?"  "Are both legs swollen or just one leg?"     Both legs-from feet up to knees 3. SEVERITY: "How bad is the swelling?" (e.g., localized; mild, moderate, severe)   - Localized: Small area of swelling localized to one leg.   - MILD pedal edema: Swelling  limited to foot and ankle, pitting edema < 1/4 inch (6 mm) deep, rest and elevation eliminate most or all swelling.   - MODERATE edema: Swelling of lower leg to knee, pitting edema > 1/4 inch (6 mm) deep, rest and elevation only partially reduce swelling.   - SEVERE edema: Swelling extends above knee, facial or hand swelling present.      Mild 4. REDNESS: "Does the swelling look red or infected?"     no 5. PAIN: "Is the swelling painful to touch?" If Yes, ask: "How painful is it?"   (Scale 1-10; mild, moderate or severe)     no 6. FEVER: "Do you have a fever?" If Yes, ask: "What is it, how was it measured, and when did it start?"      no 7. CAUSE: "What do you think is causing the leg swelling?"     unsure 8. MEDICAL HISTORY: "Do you have a history of blood clots (e.g., DVT), cancer, heart failure, kidney disease, or liver failure?"     no 9. RECURRENT SYMPTOM: "Have you had leg swelling before?" If Yes, ask: "When was the last time?" "What happened that time?"     no 10. OTHER SYMPTOMS: "Do you have any other symptoms?" (e.g., chest pain, difficulty breathing)       no  Protocols used: Leg Swelling and Edema-A-AH

## 2023-06-09 ENCOUNTER — Other Ambulatory Visit: Payer: Self-pay | Admitting: Adult Health

## 2023-06-09 ENCOUNTER — Encounter: Payer: Self-pay | Admitting: Adult Health

## 2023-06-09 ENCOUNTER — Ambulatory Visit (HOSPITAL_COMMUNITY)
Admission: RE | Admit: 2023-06-09 | Discharge: 2023-06-09 | Disposition: A | Discharge status: Home / Self Care | Source: Ambulatory Visit | Attending: Adult Health | Admitting: Adult Health

## 2023-06-09 ENCOUNTER — Ambulatory Visit (INDEPENDENT_AMBULATORY_CARE_PROVIDER_SITE_OTHER): Admitting: Adult Health

## 2023-06-09 VITALS — BP 126/84 | HR 84 | Ht 61.0 in | Wt 170.6 lb

## 2023-06-09 DIAGNOSIS — I10 Essential (primary) hypertension: Secondary | ICD-10-CM | POA: Diagnosis not present

## 2023-06-09 DIAGNOSIS — R600 Localized edema: Secondary | ICD-10-CM

## 2023-06-09 DIAGNOSIS — R7303 Prediabetes: Secondary | ICD-10-CM

## 2023-06-09 MED ORDER — FUROSEMIDE 20 MG PO TABS: 20.0000 mg | ORAL_TABLET | Freq: Every day | ORAL | 0 refills | Status: DC

## 2023-06-09 NOTE — Progress Notes (Addendum)
 [facility] clinic  Provider:  Inge Mangle DNP  Code Status:  Full Code  Goals of Care:     04/14/2023    9:53 AM  Advanced Directives  Does Patient Have a Medical Advance Directive? Yes  Type of Estate agent of [place];Living will  Does patient want to make changes to medical advance directive? No - Patient declined  Copy of Healthcare Power of Attorney in Chart? No - copy requested     Chief Complaint  Patient presents with   Leg Swelling     Swelling in legs more in the left leg, notice last week , she has follow up with cardiology , she was told that she might need to have the dosage change. She stated no pain , she has been wearing compression stock and elevated legs      Discussed the use of AI scribe software for clinical note transcription with the patient, who gave verbal consent to proceed.  HPI: Patient is a 88 y.o. female seen today for an acute visit for leg swelling. . She has experienced bilateral leg swelling, more pronounced in the left leg, for approximately two weeks. The swelling began after a change in her medication regimen, though she is unsure which medication is responsible. She is currently taking carvedilol  and amlodipine , with the latter being suspected as a potential cause of the swelling. She takes amlodipine  10 mg daily and Benicar  40 mg daily for hypertension. No pain is associated with the swelling, but she notes discomfort and a cold sensation in the legs.  She experiences occasional pain in the right leg at night, which she alleviates by elevating it on a pillow. No palpitations or other cardiac symptoms are present.  She has prediabetes. She is attempting to manage her diet to address her cholesterol levels and engages in regular exercise.    Past Medical History:  Diagnosis Date   Eczema    Per [facility] new patient packet   Graves disease    Per [facility] new patient packet   High blood pressure    Per [facility] new patient  packet   Hypertension     Past Surgical History:  Procedure Laterality Date   TOTAL ABDOMINAL HYSTERECTOMY Bilateral 1970's   TUBAL LIGATION      Allergies  Allergen Reactions   Codeine Nausea Only    unknown   Tramadol Nausea Only   Aspirin Rash    Outpatient Encounter Medications as of 06/09/2023  Medication Sig   acetaminophen  (TYLENOL ) 650 MG CR tablet Take 650 mg by mouth every 8 (eight) hours as needed for pain.   albuterol  (VENTOLIN  HFA) 108 (90 Base) MCG/ACT inhaler Inhale 2 puffs into the lungs every 4 (four) hours as needed for wheezing or shortness of breath (coughing fits).   amLODipine  (NORVASC ) 10 MG tablet Take 1 tablet (10 mg total) by mouth daily.   atorvastatin  (LIPITOR) 20 MG tablet Take 1 tablet (20 mg total) by mouth daily.   azelastine  (ASTELIN ) 0.1 % nasal spray Place 2 sprays into both nostrils 2 (two) times daily. Use in each nostril as directed   budesonide -formoterol  (SYMBICORT ) 80-4.5 MCG/ACT inhaler Inhale 2 puffs into the lungs 2 (two) times daily.   carvedilol  (COREG ) 6.25 MG tablet Take 1 tablet (6.25 mg total) by mouth 2 (two) times daily.   Dextromethorphan-guaiFENesin  (MUCINEX  DM PO) Take 2 capsules by mouth as needed.   EPINEPHrine  (EPIPEN  2-PAK) 0.3 mg/0.3 mL IJ SOAJ injection Inject 0.3 mg into the  muscle as needed. Inject 0.3mg  intramuscular route once as needed.   fluticasone  (FLONASE ) 50 MCG/ACT nasal spray Place 2 sprays into both nostrils 2 (two) times daily as needed for allergies or rhinitis.   meloxicam  (MOBIC ) 7.5 MG tablet Take 1 tablet (7.5 mg total) by mouth daily as needed for pain. May increase to two daily if ineffective and notify provider   mometasone  (ELOCON ) 0.1 % cream Apply 1 Application topically daily as needed.   montelukast  (SINGULAIR ) 10 MG tablet Take 1 tablet (10 mg total) by mouth at bedtime.   olmesartan  (BENICAR ) 40 MG tablet Take 1 tablet (40 mg total) by mouth daily.   Tiotropium Bromide Monohydrate  (SPIRIVA   RESPIMAT) 1.25 MCG/ACT AERS Inhale 2 puffs into the lungs daily.   benzonatate  (TESSALON  PERLES) 100 MG capsule Take 1 capsule (100 mg total) by mouth 3 (three) times daily as needed for cough. (Patient not taking: Reported on 06/09/2023)   No facility-administered encounter medications on file as of 06/09/2023.    Review of Systems:  Review of Systems  Constitutional:  Negative for appetite change, chills, fatigue and fever.  HENT:  Positive for hearing loss. Negative for congestion, rhinorrhea and sore throat.        Left ear hard of hearing  Eyes: Negative.   Respiratory:  Negative for cough, shortness of breath and wheezing.   Cardiovascular:  Positive for leg swelling. Negative for chest pain and palpitations.  Gastrointestinal:  Negative for abdominal pain, constipation, diarrhea, nausea and vomiting.  Genitourinary:  Negative for dysuria.  Musculoskeletal:  Negative for arthralgias, back pain and myalgias.  Skin:  Negative for color change, rash and wound.  Neurological:  Negative for dizziness, weakness and headaches.  Psychiatric/Behavioral:  Negative for behavioral problems. The patient is not nervous/anxious.     Health Maintenance  Topic Date Due   Zoster Vaccines- Shingrix (1 of 2) Never done   Pneumonia Vaccine 40+ Years old (2 of 2 - PPSV23) 12/06/2018   Medicare Annual Wellness (AWV)  09/08/2019   INFLUENZA VACCINE  09/10/2023   COVID-19 Vaccine (7 - 2024-25 season) 11/04/2023   DTaP/Tdap/Td (3 - Td or Tdap) 10/10/2028   DEXA SCAN  Completed   HPV VACCINES  Aged Out   Meningococcal B Vaccine  Aged Out    Physical Exam: Vitals:   06/09/23 0907  BP: 126/84  Pulse: 84  SpO2: 97%  Weight: 170 lb 9.6 oz (77.4 kg)  Height: 5\' 1"  (1.549 m)   Body mass index is 32.23 kg/m. Physical Exam Constitutional:      Appearance: She is obese.  HENT:     Head: Normocephalic and atraumatic.     Nose: Nose normal.     Mouth/Throat:     Mouth: Mucous membranes are moist.   Eyes:     Conjunctiva/sclera: Conjunctivae normal.  Cardiovascular:     Rate and Rhythm: Normal rate and regular rhythm.  Pulmonary:     Effort: Pulmonary effort is normal.     Breath sounds: Normal breath sounds.  Abdominal:     General: Bowel sounds are normal.     Palpations: Abdomen is soft.  Musculoskeletal:        General: Swelling present. Normal range of motion.     Cervical back: Normal range of motion.     Right lower leg: Edema present.     Left lower leg: Edema present.     Comments: Left 2+ and edemaRight trace  Skin:    General: Skin is  warm and dry.  Neurological:     General: No focal deficit present.     Mental Status: She is alert and oriented to person, place, and time.  Psychiatric:        Mood and Affect: Mood normal.        Behavior: Behavior normal.        Thought Content: Thought content normal.        Judgment: Judgment normal.     Labs reviewed: Basic Metabolic Panel: Recent Labs    08/17/22 0944 10/16/22 1952 10/17/22 0424 10/23/22 1049 02/17/23 1005  NA 140   < > 138 141 140  K 4.3   < > 3.6 4.1 4.5  CL 105   < > 99 105 102  CO2 27   < > 22 24 28   GLUCOSE 98   < > 190* 102* 99  BUN 17   < > 12 24 20   CREATININE 0.88   < > 0.87 0.86 0.85  CALCIUM  9.2   < > 8.8* 9.3 9.5  MG  --   --  1.8  --   --   TSH 0.95  --   --   --   --    < > = values in this interval not displayed.   Liver Function Tests: Recent Labs    08/17/22 0944 10/16/22 1952 02/17/23 1005  AST 13 20 13   ALT 8 16 11   ALKPHOS  --  111  --   BILITOT 0.4 0.5 0.4  PROT 7.3 8.8* 7.8  ALBUMIN  --  4.0  --    No results for input(s): "LIPASE", "AMYLASE" in the last 8760 hours. No results for input(s): "AMMONIA" in the last 8760 hours. CBC: Recent Labs    08/17/22 0944 10/16/22 1952 10/17/22 0424 10/23/22 1049 02/17/23 1005  WBC 5.4   < > 4.0 7.0 5.6  NEUTROABS 3,618  --   --  4,501 3,226  HGB 13.5   < > 15.1* 13.4 13.7  HCT 41.1   < > 46.3* 41.2 42.1  MCV  91.5   < > 90.1 93.0 92.5  PLT 232   < > 218 255 260   < > = values in this interval not displayed.   Lipid Panel: Recent Labs    08/17/22 0944 02/17/23 1005  CHOL 207* 219*  HDL 57 69  LDLCALC 131* 134*  TRIG 91 69  CHOLHDL 3.6 3.2   Lab Results  Component Value Date   HGBA1C 6.3 (H) 02/17/2023    Procedures since last visit: No results found.  Assessment/Plan  1. Bilateral lower extremity edema (Primary) -  Bilateral lower extremity edema, likely related to amlodipine . Differential includes medication-induced edema versus DVT. - Order Doppler ultrasound for both legs to rule out DVT. - Consider furosemide if no thrombus detected. - Advise compression stockings and leg elevation. - Instruct to avoid prolonged standing - VAS US  LOWER EXTREMITY VENOUS (DVT); Future-  stat Addendum: 1.38 PM, 06/09/23 -  preliminary result of doppler ultrasound was negative for DVT. Called patient and discussed result. Prescribed Lasix 20 mg daily X 3 days. Instructed patient to use compression socks, on in the morning and off at bedtime, elevate legs at night.  2. Essential hypertension -  Blood pressure controlled at 126/84 mmHg. Amlodipine  may contribute to edema.  3. Prediabetes Lab Results  Component Value Date   HGBA1C 6.3 (H) 02/17/2023   -  Dietary modifications and exercise attempted for management.  Labs/tests ordered:   bilateral lower leg venous doppler ultrasound   Return if symptoms worsen or fail to improve.  Kupono Marling Medina-Vargas, NP

## 2023-06-11 ENCOUNTER — Ambulatory Visit (HOSPITAL_BASED_OUTPATIENT_CLINIC_OR_DEPARTMENT_OTHER)

## 2023-06-11 DIAGNOSIS — R002 Palpitations: Secondary | ICD-10-CM

## 2023-06-11 DIAGNOSIS — I350 Nonrheumatic aortic (valve) stenosis: Secondary | ICD-10-CM | POA: Diagnosis not present

## 2023-06-11 DIAGNOSIS — I10 Essential (primary) hypertension: Secondary | ICD-10-CM | POA: Diagnosis not present

## 2023-06-11 DIAGNOSIS — R600 Localized edema: Secondary | ICD-10-CM | POA: Diagnosis not present

## 2023-06-13 NOTE — Progress Notes (Signed)
Bilateral lower extremity negative for DVT.

## 2023-06-14 ENCOUNTER — Telehealth: Payer: Self-pay

## 2023-06-14 LAB — ECHOCARDIOGRAM COMPLETE
AR max vel: 1.11 cm2
AV Area VTI: 1.12 cm2
AV Area mean vel: 1.02 cm2
AV Mean grad: 10 mmHg
AV Peak grad: 18.1 mmHg
Ao pk vel: 2.13 m/s
Area-P 1/2: 2.12 cm2
S' Lateral: 2.62 cm

## 2023-06-14 NOTE — Telephone Encounter (Signed)
-----   Message from Huson Medina-Vargas sent at 06/13/2023 10:15 PM EDT ----- Bilateral lower extremity negative for DVT

## 2023-06-14 NOTE — Telephone Encounter (Signed)
 I left a message for the patient to return my call.

## 2023-06-15 ENCOUNTER — Encounter (HOSPITAL_BASED_OUTPATIENT_CLINIC_OR_DEPARTMENT_OTHER): Payer: Self-pay

## 2023-06-15 NOTE — Telephone Encounter (Signed)
 Called pt to review results, call connected and then disconnected. Upon redial got busy tone. Will reattempt contact.

## 2023-06-21 ENCOUNTER — Other Ambulatory Visit (HOSPITAL_BASED_OUTPATIENT_CLINIC_OR_DEPARTMENT_OTHER): Payer: Self-pay | Admitting: *Deleted

## 2023-06-21 ENCOUNTER — Telehealth: Payer: Self-pay | Admitting: Internal Medicine

## 2023-06-21 DIAGNOSIS — I272 Pulmonary hypertension, unspecified: Secondary | ICD-10-CM

## 2023-06-21 DIAGNOSIS — I350 Nonrheumatic aortic (valve) stenosis: Secondary | ICD-10-CM

## 2023-06-21 DIAGNOSIS — R600 Localized edema: Secondary | ICD-10-CM

## 2023-06-21 MED ORDER — FUROSEMIDE 20 MG PO TABS: 20.0000 mg | ORAL_TABLET | Freq: Every day | ORAL | 11 refills | Status: DC

## 2023-06-21 MED ORDER — POTASSIUM CHLORIDE ER 10 MEQ PO TBCR
10.0000 meq | EXTENDED_RELEASE_TABLET | Freq: Every day | ORAL | 3 refills | Status: DC
Start: 2023-06-21 — End: 2023-09-24

## 2023-06-21 NOTE — Telephone Encounter (Signed)
 Patient's daughter is returning phone call in regard to Echo results. Please advise.

## 2023-06-21 NOTE — Telephone Encounter (Signed)
 Lvm for pt's daughter to go over echo results.

## 2023-06-21 NOTE — Telephone Encounter (Signed)
  Daughter is calling for echo results for her mom

## 2023-07-26 ENCOUNTER — Ambulatory Visit: Payer: Medicare Other | Admitting: Internal Medicine

## 2023-07-26 DIAGNOSIS — E785 Hyperlipidemia, unspecified: Secondary | ICD-10-CM | POA: Diagnosis not present

## 2023-07-26 DIAGNOSIS — I350 Nonrheumatic aortic (valve) stenosis: Secondary | ICD-10-CM | POA: Diagnosis not present

## 2023-07-26 DIAGNOSIS — I272 Pulmonary hypertension, unspecified: Secondary | ICD-10-CM | POA: Diagnosis not present

## 2023-07-26 DIAGNOSIS — J9601 Acute respiratory failure with hypoxia: Secondary | ICD-10-CM | POA: Diagnosis not present

## 2023-07-26 DIAGNOSIS — R600 Localized edema: Secondary | ICD-10-CM | POA: Diagnosis not present

## 2023-07-26 LAB — BASIC METABOLIC PANEL WITH GFR
BUN/Creatinine Ratio: 18 (ref 12–28)
BUN: 17 mg/dL (ref 8–27)
CO2: 22 mmol/L (ref 20–29)
Calcium: 9.6 mg/dL (ref 8.7–10.3)
Chloride: 101 mmol/L (ref 96–106)
Creatinine, Ser: 0.92 mg/dL (ref 0.57–1.00)
Glucose: 87 mg/dL (ref 70–99)
Potassium: 4.3 mmol/L (ref 3.5–5.2)
Sodium: 143 mmol/L (ref 134–144)
eGFR: 61 mL/min/{1.73_m2} (ref >59)

## 2023-07-26 NOTE — Progress Notes (Unsigned)
 Follow Up Note  RE: Ashley Johns MRN: [id number] DOB: 1935-12-01 Date of Office Visit: 07/27/2023  Referring provider: Estil Heman, NP Primary care provider: Estil Heman, NP  Chief Complaint: No chief complaint on file.  History of Present Illness: I had the pleasure of seeing Ashley Johns for a follow up visit at the [facility] on 07/27/2023. She is a 88 y.o. female, who is being followed for allergic rhinitis, cough, rash. Her previous allergy  office visit was on 05/27/2023 with Marinus Sic, FNP. Today is a regular follow up visit.  Discussed the use of AI scribe software for clinical note transcription with the patient, who gave verbal consent to proceed.  History of Present Illness            ***  Assessment and Plan: Ashley Johns is a 88 y.o. female with: Perennial allergic rhinitis/vasomotor rhinitis Past history - Perennial rhinitis x 4 years. Tried antihistamines and ipratropium with some benefit. No prior ENT evaluation. 2023 skin testing showed: Positive to dust mites and cockroaches.  Interim history - still has rhinorrhea. Only tried Ryaltris  a few times and not sure if better than her other nasal sprays.  Most likely has a component of vasomotor rhinitis.  Continue environmental control measures as below. Use over the counter antihistamines such as Zyrtec  (cetirizine ), Claritin (loratadine), Allegra (fexofenadine), or Xyzal (levocetirizine) daily as needed.  Use Flonase  (fluticasone ) nasal spray 1 spray per nostril twice a day as needed for nasal congestion.  Use Atrovent  (ipratropium) 0.03% 1-2 sprays per nostril twice a day as needed for runny nose/drainage. Nasal saline spray (i.e., Simply Saline) or nasal saline lavage (i.e., NeilMed) is recommended as needed and prior to medicated nasal sprays. If no improvement will refer to ENT next - declined referral today.   Rash and other nonspecific skin eruption Past history - Rash x 2 years  on arms and legs. Flares about once per month and uses mometasone  prn with good benefit. Patient thought she saw dermatology in the past and was told to use eczema lotion.  Interim history - asymptomatic.  Continue proper skin care.  Make sure you are using things without fragrances and scents.    Dyspnea on exertion Past history - Uses albuterol  prn with some benefit - mainly with exertion. Follows with cardiology. 2023 spirometry showed: severe restrictive disease with 6% improvement in FEV1 post bronchodilator treatment. Clinically feeling improved. Mild/moderate aortic stenosis per cards.  Interim history - using albuterol  3-4 times per week due to DOE. Discussed with patient and daughter in length that her dyspnea on exertion is most likely due to physical deconditioning. Advised her to wait a few minutes after exertion and if symptoms resolve there is no need to use the albuterol  inhaler.  Otherwise if symptoms do not improve after 5 minutes then: May use albuterol  rescue inhaler 2 puffs every 4 to 6 hours as needed for shortness of breath, chest tightness, coughing, and wheezing. Monitor frequency of use.    Return if symptoms worsen or fail to improve. Follow up with PCP regarding why she has Epipen  prescribed - denies any allergic reactions in the past.   Allergic rhinitis Continue allergen avoidance measures directed toward dust mite and cockroach as listed below Begin prednisone  10 mg once a day for the next 4 days, then stop Continue montelukast  10 mg once a day to control allergy  symptoms Continue an antihistamine once a day if needed for runny nose or itch Continue  Flonase  2 sprays in each nostril up to twice a day if needed for a stuffy nose.  In the right nostril, point the applicator out toward the right ear. In the left nostril, point the applicator out toward the left ear Begin azelastine  2 sprays in each nostril up to twice a day if needed for a runny nose Continue  ipratropium (Atrovent ) 1 to 2 sprays in each nostril up to twice a day if needed for runny nose Begin saline nasal rinses as needed for nasal symptoms. Use this before any medicated nasal sprays for best result   Cough/Dyspnea on exertion Begin tessalon  Perles up to three times a day for cough Begin Symbicort  80-2 puffs twice a day with a spacer to prevent cough or wheeze Continue montelukast  10 mg once a day to prevent cough or wheeze Continue Spiriva  1.25 2 puffs once a day Continue albuterol  2 puffs once every 4 hours if needed for cough or wheeze You may use albuterol  2 puffs 5 to 15 minutes before activity to decrease cough or wheeze Call the clinic if your symptoms worsen or if you develop a fever and we will move forward with a chest xray   Rash Continue a twice a day moisturizing routine Continue mometasone  to red and itchy areas once a day if needed. Do not use this medication longer than 2 weeks in a row If your symptoms re-occur, begin a journal of events that occurred for up to 6 hours before your symptoms began including foods and beverages consumed, soaps or perfumes you had contact with, and medications.  Assessment and Plan              No follow-ups on file.  No orders of the defined types were placed in this encounter.  Lab Orders  No laboratory test(s) ordered today    Diagnostics: Spirometry:  Tracings reviewed. Her effort: {Blank single:19197::Good reproducible efforts.,It was hard to get consistent efforts and there is a question as to whether this reflects a maximal maneuver.,Poor effort, data can not be interpreted.} FVC: ***L FEV1: ***L, ***% predicted FEV1/FVC ratio: ***% Interpretation: {Blank single:19197::Spirometry consistent with mild obstructive disease,Spirometry consistent with moderate obstructive disease,Spirometry consistent with severe obstructive disease,Spirometry consistent with possible restrictive disease,Spirometry  consistent with mixed obstructive and restrictive disease,Spirometry uninterpretable due to technique,Spirometry consistent with normal pattern,No overt abnormalities noted given today's efforts}.  Please see scanned spirometry results for details.  Skin Testing: {Blank single:19197::Select foods,Environmental allergy  panel,Environmental allergy  panel and select foods,Food allergy  panel,None,Deferred due to recent antihistamines use}. *** Results discussed with patient/family.   Medication List:  Current Outpatient Medications  Medication Sig Dispense Refill   acetaminophen  (TYLENOL ) 650 MG CR tablet Take 650 mg by mouth every 8 (eight) hours as needed for pain.     albuterol  (VENTOLIN  HFA) 108 (90 Base) MCG/ACT inhaler Inhale 2 puffs into the lungs every 4 (four) hours as needed for wheezing or shortness of breath (coughing fits). 18 g 1   amLODipine  (NORVASC ) 10 MG tablet Take 1 tablet (10 mg total) by mouth daily. 180 tablet 3   atorvastatin  (LIPITOR) 20 MG tablet Take 1 tablet (20 mg total) by mouth daily. 90 tablet 1   azelastine  (ASTELIN ) 0.1 % nasal spray Place 2 sprays into both nostrils 2 (two) times daily. Use in each nostril as directed 30 mL 5   benzonatate  (TESSALON  PERLES) 100 MG capsule Take 1 capsule (100 mg total) by mouth 3 (three) times daily as needed for cough. (Patient not  taking: Reported on 06/09/2023) 90 capsule 2   budesonide -formoterol  (SYMBICORT ) 80-4.5 MCG/ACT inhaler Inhale 2 puffs into the lungs 2 (two) times daily. 1 each 12   carvedilol  (COREG ) 6.25 MG tablet Take 1 tablet (6.25 mg total) by mouth 2 (two) times daily. 180 tablet 3   Dextromethorphan-guaiFENesin  (MUCINEX  DM PO) Take 2 capsules by mouth as needed.     EPINEPHrine  (EPIPEN  2-PAK) 0.3 mg/0.3 mL IJ SOAJ injection Inject 0.3 mg into the muscle as needed. Inject 0.3mg  intramuscular route once as needed. 1 each 1   fluticasone  (FLONASE ) 50 MCG/ACT nasal spray Place 2 sprays into both  nostrils 2 (two) times daily as needed for allergies or rhinitis. 16 g 5   furosemide  (LASIX ) 20 MG tablet Take 1 tablet (20 mg total) by mouth daily. 30 tablet 11   meloxicam  (MOBIC ) 7.5 MG tablet Take 1 tablet (7.5 mg total) by mouth daily as needed for pain. May increase to two daily if ineffective and notify provider 60 tablet 1   mometasone  (ELOCON ) 0.1 % cream Apply 1 Application topically daily as needed. 45 g 3   montelukast  (SINGULAIR ) 10 MG tablet Take 1 tablet (10 mg total) by mouth at bedtime. 90 tablet 1   olmesartan  (BENICAR ) 40 MG tablet Take 1 tablet (40 mg total) by mouth daily. 90 tablet 3   potassium chloride  (KLOR-CON ) 10 MEQ tablet Take 1 tablet (10 mEq total) by mouth daily. 30 tablet 3   Tiotropium Bromide Monohydrate  (SPIRIVA  RESPIMAT) 1.25 MCG/ACT AERS Inhale 2 puffs into the lungs daily. 4 g 5   No current facility-administered medications for this visit.   Allergies: Allergies  Allergen Reactions   Codeine Nausea Only    unknown   Tramadol Nausea Only   Aspirin Rash   I reviewed her past medical history, social history, family history, and environmental history and no significant changes have been reported from her previous visit.  Review of Systems  Constitutional:  Negative for appetite change, chills, fever and unexpected weight change.  HENT:  Positive for rhinorrhea. Negative for congestion and postnasal drip.   Eyes:  Negative for itching.  Respiratory:  Negative for cough, chest tightness, shortness of breath and wheezing.   Cardiovascular:  Negative for chest pain.  Gastrointestinal:  Negative for abdominal pain.  Genitourinary:  Negative for difficulty urinating.  Skin:  Negative for rash.  Allergic/Immunologic: Positive for environmental allergies. Negative for food allergies.  Neurological:  Negative for headaches.    Objective: There were no vitals taken for this visit. There is no height or weight on file to calculate BMI. Physical  Exam Vitals and nursing note reviewed.  Constitutional:      Appearance: Normal appearance. She is well-developed.  HENT:     Head: Normocephalic and atraumatic.     Right Ear: Tympanic membrane and external ear normal.     Left Ear: Tympanic membrane and external ear normal.     Nose: Nose normal.     Mouth/Throat:     Mouth: Mucous membranes are moist.     Pharynx: Oropharynx is clear.   Eyes:     Conjunctiva/sclera: Conjunctivae normal.    Cardiovascular:     Rate and Rhythm: Normal rate and regular rhythm.     Heart sounds: Normal heart sounds. No murmur heard.    No friction rub. No gallop.  Pulmonary:     Effort: Pulmonary effort is normal.     Breath sounds: Normal breath sounds. No wheezing, rhonchi or rales.  Musculoskeletal:     Cervical back: Neck supple.   Skin:    General: Skin is warm.     Findings: No rash.   Neurological:     Mental Status: She is alert and oriented to person, place, and time.   Psychiatric:        Behavior: Behavior normal.    Previous notes and tests were reviewed. The plan was reviewed with the patient/family, and all questions/concerned were addressed.  It was my pleasure to see Eneida today and participate in her care. Please feel free to contact me with any questions or concerns.  Sincerely,  Eudelia Hero, DO Allergy  & Immunology  [facility]  [place] office: [phone] [facility] office: [phone]

## 2023-07-27 ENCOUNTER — Ambulatory Visit (HOSPITAL_BASED_OUTPATIENT_CLINIC_OR_DEPARTMENT_OTHER): Payer: Self-pay | Admitting: Nurse Practitioner

## 2023-07-27 ENCOUNTER — Ambulatory Visit: Admitting: Allergy

## 2023-07-27 ENCOUNTER — Encounter: Payer: Self-pay | Admitting: Allergy

## 2023-07-27 VITALS — BP 98/62 | HR 88 | Temp 98.0°F | Resp 16 | Wt 167.5 lb

## 2023-07-27 DIAGNOSIS — J4540 Moderate persistent asthma, uncomplicated: Secondary | ICD-10-CM | POA: Diagnosis not present

## 2023-07-27 DIAGNOSIS — J3089 Other allergic rhinitis: Secondary | ICD-10-CM

## 2023-07-27 DIAGNOSIS — R21 Rash and other nonspecific skin eruption: Secondary | ICD-10-CM

## 2023-07-27 DIAGNOSIS — R0609 Other forms of dyspnea: Secondary | ICD-10-CM

## 2023-07-27 MED ORDER — IPRATROPIUM BROMIDE 0.03 % NA SOLN: 1.0000 | Freq: Two times a day (BID) | NASAL | 5 refills | Status: DC | PRN

## 2023-07-27 MED ORDER — BREZTRI AEROSPHERE 160-9-4.8 MCG/ACT IN AERO: 2.0000 | INHALATION_SPRAY | Freq: Two times a day (BID) | RESPIRATORY_TRACT | 5 refills | Status: DC

## 2023-07-27 NOTE — Telephone Encounter (Signed)
-----   Message from Gerldine Koch sent at 07/27/2023  7:50 AM EDT ----- Kidney function is stable. Will you please check with [facility] to see if they collected ALT and lipid? ----- Message ----- From: Interface, [facility] Lab Results In Sent: 07/26/2023  11:35 PM EDT To: Gerldine Koch, NP

## 2023-07-27 NOTE — Patient Instructions (Signed)
 Environmental allergies 2023 skin testing positive to dust mites and cockroaches.  Continue environmental control measures. Use over the counter antihistamines such as Zyrtec  (cetirizine ), Claritin (loratadine), Allegra (fexofenadine), or Xyzal (levocetirizine) daily as needed. May switch antihistamines every few months. May use Flonase  1 spray per nostril 1-2 times a day as needed.  Use Atrovent  (ipratropium) 0.03% 1-2 sprays per nostril twice a day as needed for runny nose/drainage. Nasal saline spray (i.e., Simply Saline) or nasal saline lavage (i.e., NeilMed) is recommended as needed and prior to medicated nasal sprays. If no improvement will refer to ENT next.   Rash Continue roper skin care.  Use mometasone  0.1% cream once a day as needed for rash flares. Do not use on the face, neck, armpits or groin area. Do not use more than 1 week in a row.   Breathing: Daily controller medication(s): start Breztri 2 puffs twice a day with spacer and rinse mouth afterwards.  Sample given. This replaces the Symbicort  + Spiriva . If NOT covered, then let me know I will send in Symbicort  + Spiriva  again.  May use albuterol  rescue inhaler 2 puffs or nebulizer every 4 to 6 hours as needed for shortness of breath, chest tightness, coughing, and wheezing. May use albuterol  rescue inhaler 2 puffs 5 to 15 minutes prior to strenuous physical activities. Monitor frequency of use - if you need to use it more than twice per week on a consistent basis let us  know.  Breathing control goals:  Full participation in all desired activities (may need albuterol  before activity) Albuterol  use two times or less a week on average (not counting use with activity) Cough interfering with sleep two times or less a month Oral steroids no more than once a year No hospitalizations  Follow up in 4 months or sooner if needed.

## 2023-07-27 NOTE — Telephone Encounter (Signed)
 S/w Caprese at [facility] and was very rude. Had to look up all test codes and give dx. Theses lab orders will be faxed when complete.

## 2023-08-17 NOTE — Progress Notes (Deleted)
 Location:  [facility]  POS: Clinic  Provider: Tawni America, ANP  Code Status: *** Goals of Care:     04/14/2023    9:53 AM  Advanced Directives  Does Patient Have a Medical Advance Directive? Yes  Type of Estate agent of [place];Living will  Does patient want to make changes to medical advance directive? No - Patient declined  Copy of Healthcare Power of Attorney in Chart? No - copy requested     No chief complaint on file.   HPI: Discussed the use of AI scribe software for clinical note transcription with the patient, who gave verbal consent to proceed.  History of Present Illness         Follows with asthma/allergy  due to rhinitis, allergies, rash, reactive airway disease Started on Breztri  in June 2025 PFT: severe restrictive disease with 6% improvement in FEV1 post bronchodilator treatment.   Follows with cardiology with echo 06/11/23 EF 60-65% mild to moderate aortic stenosis, increase pulmonary artery pressure started on lasix .   Skull lesion: MRI 10/17/22 1. Right frontal bone mildly expansile lesion corresponding to the CT finding is highly vascular, up to 5.7 cm long axis. But no associated dural thickening or other aggressive features. And otherwise visible bone marrow signal is normal. This is indeterminate but assuming no known malignancy then a benign vascular lesion of bone (especially hemangioma) is favored. Surveillance with noncontrast Head CT recommended to document stability (such as initial follow-up in 3 months unless clinically indicated sooner).  Past Medical History:  Diagnosis Date   Eczema    Per [facility] new patient packet   Graves disease    Per [facility] new patient packet   High blood pressure    Per [facility] new patient packet   Hypertension     Past Surgical History:  Procedure Laterality Date   TOTAL ABDOMINAL HYSTERECTOMY Bilateral 1970's   TUBAL LIGATION      Allergies  Allergen Reactions   Codeine Nausea Only     unknown   Tramadol Nausea Only   Aspirin Rash    Outpatient Encounter Medications as of 08/18/2023  Medication Sig   acetaminophen  (TYLENOL ) 650 MG CR tablet Take 650 mg by mouth every 8 (eight) hours as needed for pain.   albuterol  (VENTOLIN  HFA) 108 (90 Base) MCG/ACT inhaler Inhale 2 puffs into the lungs every 4 (four) hours as needed for wheezing or shortness of breath (coughing fits).   amLODipine  (NORVASC ) 10 MG tablet Take 1 tablet (10 mg total) by mouth daily.   atorvastatin  (LIPITOR) 20 MG tablet Take 1 tablet (20 mg total) by mouth daily.   azelastine  (ASTELIN ) 0.1 % nasal spray Place 2 sprays into both nostrils 2 (two) times daily. Use in each nostril as directed   budesonide -glycopyrrolate-formoterol  (BREZTRI  AEROSPHERE) 160-9-4.8 MCG/ACT AERO inhaler Inhale 2 puffs into the lungs in the morning and at bedtime. with spacer and rinse mouth afterwards.   carvedilol  (COREG ) 6.25 MG tablet Take 1 tablet (6.25 mg total) by mouth 2 (two) times daily.   EPINEPHrine  (EPIPEN  2-PAK) 0.3 mg/0.3 mL IJ SOAJ injection Inject 0.3 mg into the muscle as needed. Inject 0.3mg  intramuscular route once as needed.   fluticasone  (FLONASE ) 50 MCG/ACT nasal spray Place 2 sprays into both nostrils 2 (two) times daily as needed for allergies or rhinitis.   furosemide  (LASIX ) 20 MG tablet Take 1 tablet (20 mg total) by mouth daily.   ipratropium (ATROVENT ) 0.03 % nasal spray Place 1-2 sprays into both nostrils 2 (  two) times daily as needed (nasal drainage).   meloxicam  (MOBIC ) 7.5 MG tablet Take 1 tablet (7.5 mg total) by mouth daily as needed for pain. May increase to two daily if ineffective and notify provider   mometasone  (ELOCON ) 0.1 % cream Apply 1 Application topically daily as needed.   montelukast  (SINGULAIR ) 10 MG tablet Take 1 tablet (10 mg total) by mouth at bedtime.   olmesartan  (BENICAR ) 40 MG tablet Take 1 tablet (40 mg total) by mouth daily.   potassium chloride  (KLOR-CON ) 10 MEQ tablet Take 1  tablet (10 mEq total) by mouth daily.   No facility-administered encounter medications on file as of 08/18/2023.    Review of Systems:  Review of Systems  Health Maintenance  Topic Date Due   Zoster Vaccines- Shingrix (1 of 2) Never done   Pneumococcal Vaccine: 50+ Years (2 of 2 - PPSV23, PCV20, or PCV21) 12/06/2018   Medicare Annual Wellness (AWV)  09/08/2019   INFLUENZA VACCINE  09/10/2023   COVID-19 Vaccine (7 - 2024-25 season) 11/04/2023   DTaP/Tdap/Td (3 - Td or Tdap) 10/10/2028   DEXA SCAN  Completed   Hepatitis B Vaccines  Aged Out   HPV VACCINES  Aged Out   Meningococcal B Vaccine  Aged Out    Physical Exam: There were no vitals filed for this visit. There is no height or weight on file to calculate BMI. Physical Exam  Labs reviewed: Basic Metabolic Panel: Recent Labs    10/17/22 0424 10/23/22 1049 02/17/23 1005 07/26/23 0956  NA 138 141 140 143  K 3.6 4.1 4.5 4.3  CL 99 105 102 101  CO2 22 24 28 22   GLUCOSE 190* 102* 99 87  BUN 12 24 20 17   CREATININE 0.87 0.86 0.85 0.92  CALCIUM  8.8* 9.3 9.5 9.6  MG 1.8  --   --   --    Liver Function Tests: Recent Labs    10/16/22 1952 02/17/23 1005  AST 20 13  ALT 16 11  ALKPHOS 111  --   BILITOT 0.5 0.4  PROT 8.8* 7.8  ALBUMIN 4.0  --    No results for input(s): LIPASE, AMYLASE in the last 8760 hours. No results for input(s): AMMONIA in the last 8760 hours. CBC: Recent Labs    10/17/22 0424 10/23/22 1049 02/17/23 1005  WBC 4.0 7.0 5.6  NEUTROABS  --  4,501 3,226  HGB 15.1* 13.4 13.7  HCT 46.3* 41.2 42.1  MCV 90.1 93.0 92.5  PLT 218 255 260   Lipid Panel: Recent Labs    02/17/23 1005  CHOL 219*  HDL 69  LDLCALC 134*  TRIG 69  CHOLHDL 3.2   Lab Results  Component Value Date   HGBA1C 6.3 (H) 02/17/2023    Procedures since last visit: No results found.  Assessment/Plan Assessment and Plan               Labs/tests ordered:  * No order type specified * Next appt:  Visit  date not found   Total time **:  time greater than 50% of total time spent doing pt counseling and coordination of care

## 2023-08-18 ENCOUNTER — Ambulatory Visit: Payer: Medicare Other | Admitting: Adult Health

## 2023-08-18 LAB — HEPATIC FUNCTION PANEL
ALT: 10 IU/L (ref 0–32)
AST: 15 IU/L (ref 0–40)
Albumin: 4.2 g/dL (ref 3.7–4.7)
Alkaline Phosphatase: 143 IU/L — ABNORMAL HIGH (ref 44–121)
Bilirubin Total: 0.3 mg/dL (ref 0.0–1.2)
Bilirubin, Direct: 0.13 mg/dL (ref 0.00–0.40)
Total Protein: 8.3 g/dL (ref 6.0–8.5)

## 2023-08-18 LAB — LIPID PANEL W/O CHOL/HDL RATIO
Cholesterol, Total: 169 mg/dL (ref 100–199)
HDL: 58 mg/dL (ref >39)
LDL Chol Calc (NIH): 97 mg/dL (ref 0–99)
Triglycerides: 73 mg/dL (ref 0–149)
VLDL Cholesterol Cal: 14 mg/dL (ref 5–40)

## 2023-08-18 LAB — SPECIMEN STATUS REPORT

## 2023-09-08 ENCOUNTER — Ambulatory Visit (INDEPENDENT_AMBULATORY_CARE_PROVIDER_SITE_OTHER): Admitting: Family

## 2023-09-08 ENCOUNTER — Encounter: Payer: Self-pay | Admitting: Family

## 2023-09-08 VITALS — BP 130/74 | HR 80 | Temp 97.6°F | Ht 61.0 in | Wt 168.2 lb

## 2023-09-08 DIAGNOSIS — E785 Hyperlipidemia, unspecified: Secondary | ICD-10-CM

## 2023-09-08 DIAGNOSIS — I4891 Unspecified atrial fibrillation: Secondary | ICD-10-CM

## 2023-09-08 DIAGNOSIS — R7303 Prediabetes: Secondary | ICD-10-CM | POA: Diagnosis not present

## 2023-09-08 DIAGNOSIS — J4520 Mild intermittent asthma, uncomplicated: Secondary | ICD-10-CM

## 2023-09-08 DIAGNOSIS — I10 Essential (primary) hypertension: Secondary | ICD-10-CM | POA: Diagnosis not present

## 2023-09-08 DIAGNOSIS — Z23 Encounter for immunization: Secondary | ICD-10-CM

## 2023-09-08 DIAGNOSIS — M150 Primary generalized (osteo)arthritis: Secondary | ICD-10-CM | POA: Diagnosis not present

## 2023-09-08 DIAGNOSIS — J309 Allergic rhinitis, unspecified: Secondary | ICD-10-CM

## 2023-09-08 MED ORDER — BREZTRI AEROSPHERE 160-9-4.8 MCG/ACT IN AERO: 2.0000 | INHALATION_SPRAY | Freq: Two times a day (BID) | RESPIRATORY_TRACT | 5 refills | Status: DC

## 2023-09-08 MED ORDER — ALBUTEROL SULFATE HFA 108 (90 BASE) MCG/ACT IN AERS: 2.0000 | INHALATION_SPRAY | RESPIRATORY_TRACT | 1 refills | Status: DC | PRN

## 2023-09-08 NOTE — Progress Notes (Signed)
 Provider: Roxan Plough FNP-C   Karanvir Balderston, Roxan BROCKS, NP  Patient Care Team: Arleth Mccullar, Roxan BROCKS, NP as PCP - General (Family Medicine) Alvan Ronal BRAVO, MD (Inactive) as PCP - Cardiology (Cardiology) Lynnell Nottingham, MD as Consulting Physician (Dermatology)  Extended Emergency Contact Information Primary Emergency Contact: [name],[name]  [place] Mobile Phone: [phone] Relation: Daughter Secondary Emergency Contact: booker,melonie Mobile Phone: [phone] Relation: Granddaughter  Code Status:  Full Code  Goals of care: Advanced Directive information    04/14/2023    9:53 AM  Advanced Directives  Does Patient Have a Medical Advance Directive? Yes  Type of Estate agent of [place];Living will  Does patient want to make changes to medical advance directive? No - Patient declined  Copy of Healthcare Power of Attorney in Chart? No - copy requested     Chief Complaint  Patient presents with   Medical Management of Chronic Issues    Patient wants to speak with you about bad side effects from one of her Carvedilol .     Discussed the use of AI scribe software for clinical note transcription with the patient, who gave verbal consent to proceed.  History of Present Illness   Ashley Johns is an 88 year old female with hypertension and heart disease who presents for a six-month follow-up.  She experiences headaches that wake her from sleep, which she attributes to her heart medication, carvedilol , at a dose of 6.25 mg twice daily. She believes the dose may be too strong as it was increased after a period of uncontrolled high blood pressure.  She reports occasional shortness of breath, which has improved recently, as she was able to walk to her girlfriend's house twice without experiencing it. However, she is out of her albuterol  inhaler, which she uses as needed for respiratory symptoms. She also experiences palpitations when upset, which she  manages by lying down. No chest pain is reported.  She takes several medications for her blood pressure, including amlodipine  and olmesartan  40 mg daily. For cholesterol, she takes atorvastatin  20 mg. No muscle pain or aches associated with her cholesterol medication.  She experiences leg pain at night and has meloxicam  7.5 mg prescribed for pain, which she takes as needed. She has not been taking it regularly. She previously had fluid retention in her legs, for which she was prescribed furosemide  and potassium, but she is no longer taking furosemide .  She uses Flonase  and Astelin  nasal sprays as needed for allergies, and Singulair  at bedtime. She mentions using a humidifier to help with nasal congestion, which worsens when it is too hot upstairs.  She is concerned about her great-grandson, who is experiencing behavioral changes and depression. She has been his primary caregiver since he was young, and his current situation is causing her stress and affecting her sleep. He is seeing a Veterinary surgeon at [facility], but she remains worried about his well-being.    Past Medical History:  Diagnosis Date   Eczema    Per [facility] new patient packet   Graves disease    Per [facility] new patient packet   High blood pressure    Per [facility] new patient packet   Hypertension    Past Surgical History:  Procedure Laterality Date   TOTAL ABDOMINAL HYSTERECTOMY Bilateral 1970's   TUBAL LIGATION      Allergies  Allergen Reactions   Codeine Nausea Only    unknown   Tramadol Nausea Only   Aspirin Rash    Allergies  as of 09/08/2023       Reactions   Codeine Nausea Only   unknown   Tramadol Nausea Only   Aspirin Rash        Medication List        Accurate as of September 08, 2023  7:58 PM. If you have any questions, ask your nurse or doctor.          STOP taking these medications    furosemide  20 MG tablet Commonly known as: Lasix  Stopped by: [name] C Anela Bensman       TAKE these medications     acetaminophen  650 MG CR tablet Commonly known as: TYLENOL  Take 650 mg by mouth every 8 (eight) hours as needed for pain.   albuterol  108 (90 Base) MCG/ACT inhaler Commonly known as: Ventolin  HFA Inhale 2 puffs into the lungs every 4 (four) hours as needed for wheezing or shortness of breath (coughing fits).   amLODipine  10 MG tablet Commonly known as: NORVASC  Take 1 tablet (10 mg total) by mouth daily.   atorvastatin  20 MG tablet Commonly known as: LIPITOR Take 1 tablet (20 mg total) by mouth daily.   azelastine  0.1 % nasal spray Commonly known as: ASTELIN  Place 2 sprays into both nostrils 2 (two) times daily. Use in each nostril as directed   Breztri  Aerosphere 160-9-4.8 MCG/ACT Aero inhaler Generic drug: budesonide -glycopyrrolate-formoterol  Inhale 2 puffs into the lungs in the morning and at bedtime. with spacer and rinse mouth afterwards.   carvedilol  6.25 MG tablet Commonly known as: COREG  Take 1 tablet (6.25 mg total) by mouth 2 (two) times daily.   EPINEPHrine  0.3 mg/0.3 mL Soaj injection Commonly known as: EpiPen  2-Pak Inject 0.3 mg into the muscle as needed. Inject 0.3mg  intramuscular route once as needed.   fluticasone  50 MCG/ACT nasal spray Commonly known as: FLONASE  Place 2 sprays into both nostrils 2 (two) times daily as needed for allergies or rhinitis.   ipratropium 0.03 % nasal spray Commonly known as: ATROVENT  Place 1-2 sprays into both nostrils 2 (two) times daily as needed (nasal drainage).   meloxicam  7.5 MG tablet Commonly known as: MOBIC  Take 1 tablet (7.5 mg total) by mouth daily as needed for pain. May increase to two daily if ineffective and notify provider   mometasone  0.1 % cream Commonly known as: ELOCON  Apply 1 Application topically daily as needed.   montelukast  10 MG tablet Commonly known as: SINGULAIR  Take 1 tablet (10 mg total) by mouth at bedtime.   olmesartan  40 MG tablet Commonly known as: BENICAR  Take 1 tablet (40 mg total)  by mouth daily.   potassium chloride  10 MEQ tablet Commonly known as: KLOR-CON  Take 1 tablet (10 mEq total) by mouth daily.        Review of Systems  Constitutional:  Negative for appetite change, chills, fatigue, fever and unexpected weight change.  HENT:  Negative for congestion, dental problem, ear discharge, ear pain, facial swelling, hearing loss, nosebleeds, postnasal drip, rhinorrhea, sinus pressure, sinus pain, sneezing, sore throat, tinnitus and trouble swallowing.   Eyes:  Negative for pain, discharge, redness, itching and visual disturbance.  Respiratory:  Negative for cough, chest tightness and wheezing.        Shortness of breath has improved   Cardiovascular:  Negative for chest pain, palpitations and leg swelling.  Gastrointestinal:  Negative for abdominal distention, abdominal pain, blood in stool, constipation, diarrhea, nausea and vomiting.  Endocrine: Negative for cold intolerance, heat intolerance, polydipsia, polyphagia and polyuria.  Genitourinary:  Negative  for difficulty urinating, dysuria, flank pain, frequency and urgency.  Musculoskeletal:  Positive for arthralgias. Negative for back pain, gait problem, joint swelling, myalgias, neck pain and neck stiffness.  Skin:  Negative for color change, pallor, rash and wound.  Neurological:  Negative for dizziness, syncope, speech difficulty, weakness, light-headedness, numbness and headaches.  Hematological:  Does not bruise/bleed easily.  Psychiatric/Behavioral:  Negative for agitation, behavioral problems, confusion, hallucinations, self-injury, sleep disturbance and suicidal ideas. The patient is not nervous/anxious.     Immunization History  Administered Date(s) Administered   Fluad Quad(high Dose 65+) 10/11/2018   Fluad Trivalent(High Dose 65+) 10/23/2022   Influenza, High Dose Seasonal PF 12/03/2015, 11/07/2019   Influenza,inj,Quad PF,6+ Mos 10/11/2018   Influenza-Unspecified 11/16/2017, 11/27/2020   PFIZER  Comirnaty(Gray Top)Covid-19 Tri-Sucrose Vaccine 07/31/2020   PFIZER(Purple Top)SARS-COV-2 Vaccination 02/18/2019, 03/11/2019, 11/29/2019, 07/31/2020   PNEUMOCOCCAL CONJUGATE-20 09/08/2023   Pneumococcal Conjugate-13 10/11/2018   Pneumococcal-Unspecified 04/27/2023   Td 10/11/2018   Tdap 10/11/2018   Unspecified SARS-COV-2 Vaccination 05/04/2023   Pertinent  Health Maintenance Due  Topic Date Due   INFLUENZA VACCINE  09/10/2023   DEXA SCAN  Completed      07/03/2022    1:08 PM 08/17/2022    8:46 AM 04/14/2023    9:53 AM 06/09/2023    9:09 AM 09/08/2023   11:09 AM  Fall Risk  Falls in the past year? 0 0 0 0 0  Was there an injury with Fall? 0 0 0 0 0  Fall Risk Category Calculator 0 0 0 0 0  Patient at Risk for Falls Due to No Fall Risks No Fall Risks No Fall Risks No Fall Risks No Fall Risks  Fall risk Follow up Falls evaluation completed Falls evaluation completed Education provided;Falls evaluation completed;Falls prevention discussed Falls prevention discussed;Falls evaluation completed Falls evaluation completed   Functional Status Survey:    Vitals:   09/08/23 1102  BP: 130/74  Pulse: 80  Temp: 97.6 F (36.4 C)  SpO2: 95%  Height: 5' 1 (1.549 m)   Body mass index is 31.65 kg/m. Physical Exam  GENERAL: Alert, cooperative, well developed, no acute distress. HEENT: Normocephalic, normal oropharynx, moist mucous membranes, ears normal without cerumen impaction, nose normal. CHEST: Clear to auscultation bilaterally, no wheezes, rhonchi, or crackles. CARDIOVASCULAR: Normal heart rate and rhythm, S1 and S2 normal without murmurs. ABDOMEN: Soft, non-tender, non-distended, without organomegaly, normal bowel sounds. EXTREMITIES: No cyanosis or edema. MUSCULOSKELETAL: Normal range of motion in knees and hips, non-tender. NEUROLOGICAL: Cranial nerves grossly intact, moves all extremities without gross motor or sensory deficit.  SKIN: No rash,no lesion or erythema    PSYCHIATRY/BEHAVIORAL: Mood stable   Labs reviewed: Recent Labs    10/17/22 0424 10/23/22 1049 02/17/23 1005 07/26/23 0956  NA 138 141 140 143  K 3.6 4.1 4.5 4.3  CL 99 105 102 101  CO2 22 24 28 22   GLUCOSE 190* 102* 99 87  BUN 12 24 20 17   CREATININE 0.87 0.86 0.85 0.92  CALCIUM  8.8* 9.3 9.5 9.6  MG 1.8  --   --   --    Recent Labs    10/16/22 1952 02/17/23 1005 07/26/23 0956  AST 20 13 15   ALT 16 11 10   ALKPHOS 111  --  143*  BILITOT 0.5 0.4 0.3  PROT 8.8* 7.8 8.3  ALBUMIN 4.0  --  4.2   Recent Labs    10/17/22 0424 10/23/22 1049 02/17/23 1005  WBC 4.0 7.0 5.6  NEUTROABS  --  4,501 3,226  HGB 15.1* 13.4 13.7  HCT 46.3* 41.2 42.1  MCV 90.1 93.0 92.5  PLT 218 255 260   Lab Results  Component Value Date   TSH 0.95 08/17/2022   Lab Results  Component Value Date   HGBA1C 6.3 (H) 02/17/2023   Lab Results  Component Value Date   CHOL 169 07/26/2023   HDL 58 07/26/2023   LDLCALC 97 07/26/2023   TRIG 73 07/26/2023   CHOLHDL 3.2 02/17/2023    Significant Diagnostic Results in last 30 days:  No results found.  Assessment/Plan  Hypertension and Heart Failure Experiencing headaches potentially related to carvedilol . No chest pain but occasional shortness of breath and palpitations, particularly when upset. No current fluid retention in legs. - Discuss concerns about carvedilol  dosage with cardiologist in September. - Continue current medication regimen including carvedilol , amlodipine , and olmesartan . - Monitor for any recurrence of fluid retention and report if it occurs.  Asthma Out of albuterol  inhaler. Combined inhaler prescribed by allergy  doctor is costly due to insurance deductible. - Refill albuterol  inhaler. - Encourage use of humidifier to alleviate nasal congestion.  Osteoarthritis Experiences leg pain at night, manageable with meloxicam  7.5 mg as needed. - Take meloxicam  7.5 mg at bedtime if experiencing significant  pain.  Hyperlipidemia Advised to improve diet and exercise to manage high cholesterol levels. Signed up for water aerobics at the [facility]. - Encourage adherence to a diet rich in fruits, vegetables, and baked foods. - Encourage regular exercise, including water aerobics.  Prediabetes Previously noted to be prediabetic. Need to recheck blood sugar levels to monitor condition. - Order lab tests to recheck blood sugar levels.  Allergic Rhinitis Uses Flonase  and Astelin  nasal sprays as needed for allergy  symptoms. Experiences nasal congestion, particularly in the mornings, possibly due to environmental factors such as heat. - Continue using Flonase  and Astelin  nasal sprays as needed. - Use a humidifier to help with nasal congestion.     Family/ staff Communication: Reviewed plan of care with patient verbalized understanding   Labs/tests ordered:  - CBC/diff  - Hgb A1C  - TSH level   Next Appointment : Return in about 6 months (around 03/10/2024) for medical mangement of chronic issues., Annual wellness visit soon .   Spent 30 minutes of Face to face and non-face to face with patient  >50% time spent counseling; reviewing medical record; tests; labs; documentation and developing future plan of care.   Roxan JAYSON Plough, NP

## 2023-09-09 ENCOUNTER — Ambulatory Visit: Payer: Self-pay | Admitting: Family

## 2023-09-09 LAB — CBC WITH DIFFERENTIAL/PLATELET
Absolute Lymphocytes: 1352 {cells}/uL (ref 850–3900)
Absolute Monocytes: 380 {cells}/uL (ref 200–950)
Basophils Absolute: 31 {cells}/uL (ref 0–200)
Basophils Relative: 0.6 %
Eosinophils Absolute: 229 {cells}/uL (ref 15–500)
Eosinophils Relative: 4.4 %
HCT: 41.2 % (ref 35.0–45.0)
Hemoglobin: 13.2 g/dL (ref 11.7–15.5)
MCH: 30.3 pg (ref 27.0–33.0)
MCHC: 32 g/dL (ref 32.0–36.0)
MCV: 94.5 fL (ref 80.0–100.0)
MPV: 9.6 fL (ref 7.5–12.5)
Monocytes Relative: 7.3 %
Neutro Abs: 3208 {cells}/uL (ref 1500–7800)
Neutrophils Relative %: 61.7 %
Platelets: 220 Thousand/uL (ref 140–400)
RBC: 4.36 Million/uL (ref 3.80–5.10)
RDW: 12.8 % (ref 11.0–15.0)
Total Lymphocyte: 26 %
WBC: 5.2 Thousand/uL (ref 3.8–10.8)

## 2023-09-09 LAB — HEMOGLOBIN A1C
Hgb A1c MFr Bld: 6.3 % — ABNORMAL HIGH (ref <5.7)
Mean Plasma Glucose: 134 mg/dL
eAG (mmol/L): 7.4 mmol/L

## 2023-09-09 LAB — TSH: TSH: 0.72 m[IU]/L (ref 0.40–4.50)

## 2023-09-15 ENCOUNTER — Telehealth: Payer: Self-pay | Admitting: Nurse Practitioner

## 2023-09-15 NOTE — Telephone Encounter (Signed)
 Received a call back from patient's daughter Joen.She stated mother has been having numbness in both lower legs,no pain,no swelling since this past Monday 8/4.She is also complaining of tiredness and waking up at night with a headache.She held pm dose of Carvedilol .Advised to restart pm dose of Carvedilol  if B/P becomes elevated.Advised to monitor B/P and pulse daily, bring readings to appointment scheduled with Reche Finder NP 8/15 at 10:05 am at [facility] office.

## 2023-09-15 NOTE — Telephone Encounter (Signed)
 Pt c/o medication issue:  1. Name of Medication:  amLODipine  (NORVASC ) 10 MG tablet- 1 tablet once daily carvedilol  (COREG ) 6.25 MG tablet Olmesartan  40 MG tablet  2. How are you currently taking this medication (dosage and times per day)?  As prescribed  3. Are you having a reaction (difficulty breathing--STAT)?   4. What is your medication issue?   Patient's daughter says patient is on 3 BP medications and she thinks one of them is contributing to numbness in leg(s). Daughter doesn't know if it's just one leg or both. She says this has been waking the patient up and she is very tired. She declines edema. She says Furosemide  has helped keep the fluid off. Please advise.

## 2023-09-15 NOTE — Telephone Encounter (Signed)
 Returned call to patient's daughter Joen left message on voice mail to call back.

## 2023-09-22 ENCOUNTER — Encounter: Admitting: Family

## 2023-09-22 NOTE — Progress Notes (Signed)
 Cardiology Office Note:    Date:  09/24/2023   ID:  Ashley Johns, DOB May 23, 1935, MRN [id number]  PCP:  Leonarda Roxan JAYSON, NP   [facility] Providers Cardiologist:  Alvan Ronal BRAVO, MD (Inactive)     Referring MD: Leonarda Roxan JAYSON, NP   Chief complaint: Follow up for medication management  History of Present Illness:    Ashley Johns is a 88 y.o. female with a hx of hypertension, Graves' disease s/p thyroidectomy, palpitations, aortic valve calcification with mild to moderate stenosis, mitral valve regurgitation here today for 39-month follow-up.  She was referred to cardiology services and saw Ronal Alvan on 05/01/2021 for DOE and HTN.  Subsequent SPECT study was low risk, no ischemia or infarction, and TTE showed an EF of 50-55%, hypokinesis of the inferolateral wall, normal RV, mild-moderate MR, aortic mean gradient 10 mmHg with a calcified valve.  Patient followed up later that year for palpitations, Zio patch was ordered, which showed frequent brief SVT corresponding with triggered events, predominant underlying rhythm sinus rhythm, average heart rate 83 bpm, longest interval SVT lasting 11.6 seconds.  On 10/16/2022 patient was admitted to the emergency department with tachypnea, O2 saturations in the upper 80s on RA, slight tachycardia, BP elevations as high as 208/132, left leg swelling.  EKG showed sinus rhythm with PACs and LVH.  Head CT negative for acute intracranial abnormality, however MRI was ordered following notation of mixed lytic and sclerotic area involving the right frontal bone on CT.  Notable labs included normal WBC, negative respiratory virus panel, D-dimer 0.63, and BNP of 127.  Treatment in ED consisted of oxygen, IV Solu-Medrol , hydralazine , albuterol , DuoNeb, and overnight admission for observation.  MRI revealed a highly vascular, up to 5.7 cm long axis, mildly expansile lesion of the right frontal bone, reported as indeterminant, but assuming no known  malignancy then a benign vascular lesion of bone is favored.  Recommended noncontrast head CT for surveillance to document stability on a future outpatient basis.  Patient was started on Norvasc  had her simvastatin  changed to atorvastatin , continued on Olmesartan .  At her follow-up on 05/24/2023 a repeat echo was ordered, there was also new leg swelling that was attributed to her recent increase in Norvasc  dosing.  Patient was advised to wear compression stockings and elevate legs while pending the results of the echo.  Echo on 06/11/2023 revealed LVEF 60-65%, normal LV function, no RWMA, mild left ventricular hypertrophy, RV SF mildly reduced, RV size mildly enlarged, moderately elevated PASP, estimated RV systolic pressure of 59 mmHg.  Reported trivial mitral valve regurgitation.  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  Patient was started on 20 mg of Lasix  daily with K-Dur.  BP was well-controlled at that time on carvedilol , amlodipine , and olmesartan .  LDL this time was 97, previously 120, the decision was made between both the patient and the provider to try to make healthy diet changes and be as physically active as possible instead of adding further medications for cholesterol improvement at that time. Patient had a bilateral LE venous duplex to assess for DVTs on 06/09/2023, findings were negative for DVT bilaterally.  Patient presents to the office today with her daughter.  Patient has some new/worsening complaints of dyspnea on exertion with chest tightness, particularly when she tries to go up stairs.  States during these episodes she  occasionally feels palpitations, dizziness, and rarely near syncopal.  Was started on Lasix  20 mg back in May for elevated PASP and leg swelling, PCP d/c'd Lasix  due to resolution of leg swelling on 09/08/2023.  Denies  nausea, dark/bloody stools, current leg swelling.  Does complain of bilateral lower leg/calf tingling only when trying to fall asleep, resolves with moving legs.  States it does not happen during the day, denies pain, numbness, tingling with walking/activity, denies previous back injury.  Will occasionally use albuterol  inhaler for DOE with mild improvement.  Describes some difficulties getting her combination inhaler due to price.  While describing symptoms of DOE, palpitations, and dizziness patient became tearful and said she was just leaving to God.  EKG in office showed sinus rhythm with PACs, otherwise unchanged from previous EKG.  Patient not currently symptomatic during exam.    Past Medical History:  Diagnosis Date   Eczema    Per [facility] new patient packet   Graves disease    Per [facility] new patient packet   High blood pressure    Per [facility] new patient packet   Hypertension     Past Surgical History:  Procedure Laterality Date   TOTAL ABDOMINAL HYSTERECTOMY Bilateral 1970's   TUBAL LIGATION      Current Medications: Current Meds  Medication Sig   acetaminophen  (TYLENOL ) 650 MG CR tablet Take 650 mg by mouth every 8 (eight) hours as needed for pain.   albuterol  (VENTOLIN  HFA) 108 (90 Base) MCG/ACT inhaler Inhale into the lungs every 6 (six) hours as needed for wheezing or shortness of breath.   amLODipine  (NORVASC ) 10 MG tablet Take 1 tablet (10 mg total) by mouth daily.   atorvastatin  (LIPITOR) 20 MG tablet Take 1 tablet (20 mg total) by mouth daily.   azelastine  (ASTELIN ) 0.1 % nasal spray Place 2 sprays into both nostrils 2 (two) times daily. Use in each nostril as directed   carvedilol  (COREG ) 6.25 MG tablet Take 1 tablet (6.25 mg total) by mouth 2 (two) times daily.   EPINEPHrine  (EPIPEN  2-PAK) 0.3 mg/0.3 mL IJ SOAJ injection Inject 0.3 mg into the muscle as needed. Inject 0.3mg  intramuscular route once as needed.   fluticasone  (FLONASE ) 50 MCG/ACT nasal spray Place 2 sprays into  both nostrils 2 (two) times daily as needed for allergies or rhinitis.   furosemide  (LASIX ) 20 MG tablet Take 1 tablet (20 mg total) by mouth daily.   ipratropium (ATROVENT ) 0.03 % nasal spray Place 1-2 sprays into both nostrils 2 (two) times daily as needed (nasal drainage).   meloxicam  (MOBIC ) 7.5 MG tablet Take 1 tablet (7.5 mg total) by mouth daily as needed for pain. May increase to two daily if ineffective and notify provider   mometasone  (ELOCON ) 0.1 % cream Apply 1 Application topically daily as needed.   montelukast  (SINGULAIR ) 10 MG tablet Take 1 tablet (10 mg total) by mouth at bedtime.   olmesartan  (BENICAR ) 40 MG tablet Take 1 tablet (40 mg total) by mouth daily.   [DISCONTINUED] potassium chloride  (KLOR-CON ) 10 MEQ tablet Take 1 tablet (10 mEq total) by mouth daily.     Allergies:   Codeine, Tramadol, and Aspirin   Social History   Socioeconomic History   Marital status: Widowed    Spouse name: Not on file   Number of children: Not on file   Years of education: Not on file   Highest education level: Not on file  Occupational History   Not on file  Tobacco Use  Smoking status: Former   Smokeless tobacco: Never   Tobacco comments:    Smoked for 6 years, Quit at age 72  Vaping Use   Vaping status: Never Used  Substance and Sexual Activity   Alcohol use: Never   Drug use: Never   Sexual activity: Not Currently  Other Topics Concern   Not on file  Social History Narrative   Diet      Do you drink/eat things with caffeine: Yes      Marital Status: Widowed  What year were you married? 1972      Do you live in a house, apartment, assisted living, condo, trailer, etc.? Townhouse      Is it one or more stories? 2      How many persons live in your home? 3         Do you have any pets in your home?(please list): Yes, dog and fish      Highest level of education completed: High School      Current or past profession: Day care owner, foster parent      Do you  exercise?: sometimes Type and how often: Walking      Living Will? No      DNR form? No  If not, do you wish to discuss one?: Yes      POA/HPOA forms? No      Difficulty bathing or dressing yourself? No      Difficulty preparing food or eating? No      Difficulty managing medications? No      Difficulty managing your finances? No      Difficulty affording your medications? No                     Social Drivers of Corporate investment banker Strain: Low Risk  (12/09/2017)   Overall Financial Resource Strain (CARDIA)    Difficulty of Paying Living Expenses: Not hard at all  Food Insecurity: No Food Insecurity (10/17/2022)   Hunger Vital Sign    Worried About Running Out of Food in the Last Year: Never true    Ran Out of Food in the Last Year: Never true  Transportation Needs: No Transportation Needs (10/17/2022)   PRAPARE - Administrator, Civil Service (Medical): No    Lack of Transportation (Non-Medical): No  Physical Activity: Unknown (09/08/2018)   Exercise Vital Sign    Days of Exercise per Week: 1 day    Minutes of Exercise per Session: Not on file  Stress: No Stress Concern Present (12/09/2017)   [employer] of Occupational Health - Occupational Stress Questionnaire    Feeling of Stress : Not at all  Social Connections: Not on file     Family History: The patient's family history includes Hypertension in her father; Tuberculosis in her mother.  ROS:   Please see the history of present illness.     All other systems reviewed and are negative.  EKGs/Labs/Other Studies Reviewed:    The following studies were reviewed today:  EKG Interpretation Date/Time:  Friday September 24 2023 11:12:33 EDT Ventricular Rate:  79 PR Interval:  176 QRS Duration:  88 QT Interval:  398 QTC Calculation: 456 R Axis:   34  Text Interpretation: Sinus rhythm with Premature supraventricular complexes Confirmed by Kieana Livesay [phone]) on 09/24/2023 11:47:58 AM     Recent Labs: 10/16/2022: B Natriuretic Peptide 126.5 10/17/2022: Magnesium 1.8 07/26/2023: ALT 10; BUN 17; Creatinine, Ser 0.92;  Potassium 4.3; Sodium 143 09/08/2023: Hemoglobin 13.2; Platelets 220; TSH 0.72  Recent Lipid Panel    Component Value Date/Time   CHOL 169 07/26/2023 0956   TRIG 73 07/26/2023 0956   HDL 58 07/26/2023 0956   CHOLHDL 3.2 02/17/2023 1005   LDLCALC 97 07/26/2023 0956   LDLCALC 134 (H) 02/17/2023 1005        Physical Exam:    VS:  BP 124/76 (BP Location: Left Arm, Patient Position: Sitting, Cuff Size: Large)   Pulse 86   Resp 16   Ht 5' 1 (1.549 m)   Wt 167 lb 8 oz (76 kg)   SpO2 97%   BMI 31.65 kg/m     Wt Readings from Last 3 Encounters:  09/24/23 167 lb 8 oz (76 kg)  09/08/23 168 lb 3.2 oz (76.3 kg)  07/27/23 167 lb 8 oz (76 kg)     GEN:  Well nourished, well developed in no acute distress HEENT: Hard of hearing at baseline NECK: No carotid bruits CARDIAC: S1-S2 normal, RRR with occasional premature beats, systolic murmur, no rubs, no gallops.  Radial pulse +2, unable to palpate DP or PT pulses, bilateral feet warm, dry, good coloring. RESPIRATORY:  Clear to auscultation without rales, wheezing or rhonchi  MUSCULOSKELETAL:  No edema; No deformity  SKIN: Warm and dry NEUROLOGIC:  Alert and oriented x 3 PSYCHIATRIC:  Normal affect   ASSESSMENT:    1. DOE (dyspnea on exertion)   2. Aortic stenosis, moderate   3. Bilateral lower extremity edema   4. Precordial pain    PLAN:    In order of problems listed above:  Chest tightness and dyspnea on exertion -New symptoms of chest tightness, DOE, palpitations, dizziness, near-syncope -EKG sinus rhythm with PACs in office -Restart Lasix  20 mg once daily, Restart KLOR-CON  10 MEQ once daily for elevated PASP and RV pressures to see if this improves symptoms. - BNP, BMP, mag levels today -Repeat these labs in 2 weeks to evaluate for changes following Lasix  start - Lexiscan  stress test in 05/2021  was normal, low risk study.  CT angio chest on 10/2022 did show patchy three-vessel coronary artery calcifications.  Given new symptoms today we will reorder Lexiscan  to evaluate for new ischemia.  2.    Aortic stenosis -Echo 06/11/2023: Moderately elevated PASP, RVSP 59 mmHg.  Fused right and left coronary cusps of aortic valve with severe calcification, mild to moderate aortic valve stenosis, mean gradient 10 mmHg. - If symptoms persist following Lasix  administration and if Lexiscan  is normal, would be reasonable to consider repeating an echo.  3.    Essential hypertension  - BP appears WNL in office today - Continue carvedilol  6.25 mg twice daily  - Continue Norvasc  10 mg daily  - Continue olmesartan  40 mg daily   4.    Numbness and tingling of both legs  - Symptoms start when laying down to sleep, resolve with activity  - Denies pain with walking, no lower extremity edema on exam -Suspect restless leg syndrome considering lack of symptoms with activity, lack of edema, onset of symptoms while lying in bed that are relieved by moving legs.  Recommended patient follow-up with her primary care provider        Informed Consent   Shared Decision Making/Informed Consent The risks [chest pain, shortness of breath, cardiac arrhythmias, dizziness, blood pressure fluctuations, myocardial infarction, stroke/transient ischemic attack, nausea, vomiting, allergic reaction, radiation exposure, metallic taste sensation and life-threatening complications (estimated to be 1  in 10,000)], benefits (risk stratification, diagnosing coronary artery disease, treatment guidance) and alternatives of a nuclear stress test were discussed in detail with Ms. Hahne and she agrees to proceed.      Medication Adjustments/Labs and Tests Ordered: Current medicines are reviewed at length with the patient today.  Concerns regarding medicines are outlined above.  Orders Placed This Encounter  Procedures   Basic metabolic  panel with GFR   Magnesium   B Nat Peptide   Basic metabolic panel with GFR   B Nat Peptide   Cardiac Stress Test: Informed Consent Details: Physician/Practitioner Attestation; Transcribe to consent form and obtain patient signature   MYOCARDIAL PERFUSION IMAGING   EKG 12-Lead   Meds ordered this encounter  Medications   furosemide  (LASIX ) 20 MG tablet    Sig: Take 1 tablet (20 mg total) by mouth daily.    Dispense:  90 tablet    Refill:  3   potassium chloride  (KLOR-CON ) 10 MEQ tablet    Sig: Take 1 tablet (10 mEq total) by mouth daily.    Dispense:  90 tablet    Refill:  3    Patient Instructions  Medication Instructions:   RESUME Furosemide  (Lasix ) 20mg  daily  This is to help reduce pressures in your lungs to improve your shortness of breath  START Potassium 10mEq daily  *If you need a refill on your cardiac medications before your next appointment, please call your pharmacy*  Lab Work: Your physician recommends that you return for lab work today: BNP, BMET, mag  Your physician recommends that you return for lab work in 7-10 lab work: BNP, BMET  If you have labs (blood work) drawn today and your tests are completely normal, you will receive your results only by: [employer] (if you have MyChart) OR A paper copy in the mail If you have any lab test that is abnormal or we need to change your treatment, we will call you to review the results.  Testing/Procedures:  Your physician has requested that you have a lexiscan  myoview . For further information please visit [email or web address] Please follow instruction sheet, as given.   Follow-Up: At [facility], you and your health needs are our priority.  As part of our continuing mission to provide you with exceptional heart care, our providers are all part of one team.  This team includes your primary Cardiologist (physician) and Advanced Practice Providers or APPs (Physician Assistants and Nurse Practitioners)  who all work together to provide you with the care you need, when you need it.  Your next appointment:   2 month(s)  Provider:   Shelda Bruckner, MD, Rosaline Bane, NP, or Reche Finder, NP    We recommend signing up for the patient portal called MyChart.  Sign up information is provided on this After Visit Summary.  MyChart is used to connect with patients for Virtual Visits (Telemedicine).  Patients are able to view lab/test results, encounter notes, upcoming appointments, etc.  Non-urgent messages can be sent to your provider as well.   To learn more about what you can do with MyChart, go to [email or web address].   Other Instructions      Your prescription deductible is $255 per year. We gave you a handout to inquire with Medicare about paying this gradually. Would also recommend asking Allergy  team if patient assistance or samples are available.   Recommend talking with primary care about your leg pain. It may be related to restless leg syndrome.   To prevent or  reduce lower extremity swelling: Eat a low salt diet. Salt makes the body hold onto extra fluid which causes swelling. Sit with legs elevated. For example, in the recliner or on an ottoman.  Wear knee-high compression stockings during the daytime. Ones labeled 15-20 mmHg provide good compression.    Signed, Miriam FORBES Shams, NP  09/24/2023 12:57 PM    [facility]

## 2023-09-24 ENCOUNTER — Encounter (HOSPITAL_BASED_OUTPATIENT_CLINIC_OR_DEPARTMENT_OTHER): Payer: Self-pay | Admitting: Emergency Medicine

## 2023-09-24 ENCOUNTER — Ambulatory Visit (HOSPITAL_BASED_OUTPATIENT_CLINIC_OR_DEPARTMENT_OTHER): Admitting: Emergency Medicine

## 2023-09-24 ENCOUNTER — Encounter (HOSPITAL_BASED_OUTPATIENT_CLINIC_OR_DEPARTMENT_OTHER): Payer: Self-pay

## 2023-09-24 ENCOUNTER — Other Ambulatory Visit (HOSPITAL_BASED_OUTPATIENT_CLINIC_OR_DEPARTMENT_OTHER): Payer: Self-pay

## 2023-09-24 VITALS — BP 124/76 | HR 86 | Resp 16 | Ht 61.0 in | Wt 167.5 lb

## 2023-09-24 DIAGNOSIS — R0609 Other forms of dyspnea: Secondary | ICD-10-CM | POA: Diagnosis not present

## 2023-09-24 DIAGNOSIS — R202 Paresthesia of skin: Secondary | ICD-10-CM | POA: Diagnosis not present

## 2023-09-24 DIAGNOSIS — R200 Anesthesia of skin: Secondary | ICD-10-CM | POA: Diagnosis not present

## 2023-09-24 DIAGNOSIS — I10 Essential (primary) hypertension: Secondary | ICD-10-CM

## 2023-09-24 DIAGNOSIS — I350 Nonrheumatic aortic (valve) stenosis: Secondary | ICD-10-CM | POA: Diagnosis not present

## 2023-09-24 DIAGNOSIS — R600 Localized edema: Secondary | ICD-10-CM | POA: Diagnosis not present

## 2023-09-24 MED ORDER — FUROSEMIDE 20 MG PO TABS: 20.0000 mg | ORAL_TABLET | Freq: Every day | ORAL | 3 refills | Status: DC

## 2023-09-24 MED ORDER — POTASSIUM CHLORIDE ER 10 MEQ PO TBCR: 10.0000 meq | EXTENDED_RELEASE_TABLET | Freq: Every day | ORAL | 3 refills | Status: DC

## 2023-09-24 NOTE — Patient Instructions (Signed)
 Medication Instructions:   RESUME Furosemide  (Lasix ) 20mg  daily  This is to help reduce pressures in your lungs to improve your shortness of breath  START Potassium 10mEq daily  *If you need a refill on your cardiac medications before your next appointment, please call your pharmacy*  Lab Work: Your physician recommends that you return for lab work today: BNP, BMET, mag  Your physician recommends that you return for lab work in 7-10 lab work: BNP, BMET  If you have labs (blood work) drawn today and your tests are completely normal, you will receive your results only by: [employer] (if you have MyChart) OR A paper copy in the mail If you have any lab test that is abnormal or we need to change your treatment, we will call you to review the results.  Testing/Procedures:  Your physician has requested that you have a lexiscan  myoview . For further information please visit [email or web address] Please follow instruction sheet, as given.   Follow-Up: At [facility], you and your health needs are our priority.  As part of our continuing mission to provide you with exceptional heart care, our providers are all part of one team.  This team includes your primary Cardiologist (physician) and Advanced Practice Providers or APPs (Physician Assistants and Nurse Practitioners) who all work together to provide you with the care you need, when you need it.  Your next appointment:   2 month(s)  Provider:   Shelda Bruckner, MD, Rosaline Bane, NP, or Reche Finder, NP    We recommend signing up for the patient portal called MyChart.  Sign up information is provided on this After Visit Summary.  MyChart is used to connect with patients for Virtual Visits (Telemedicine).  Patients are able to view lab/test results, encounter notes, upcoming appointments, etc.  Non-urgent messages can be sent to your provider as well.   To learn more about what you can do with MyChart, go to  [email or web address].   Other Instructions      Your prescription deductible is $255 per year. We gave you a handout to inquire with Medicare about paying this gradually. Would also recommend asking Allergy  team if patient assistance or samples are available.   Recommend talking with primary care about your leg pain. It may be related to restless leg syndrome.   To prevent or reduce lower extremity swelling: Eat a low salt diet. Salt makes the body hold onto extra fluid which causes swelling. Sit with legs elevated. For example, in the recliner or on an ottoman.  Wear knee-high compression stockings during the daytime. Ones labeled 15-20 mmHg provide good compression.

## 2023-09-27 LAB — BASIC METABOLIC PANEL WITH GFR
BUN/Creatinine Ratio: 19 (ref 12–28)
BUN: 15 mg/dL (ref 8–27)
CO2: 22 mmol/L (ref 20–29)
Calcium: 9.8 mg/dL (ref 8.7–10.3)
Chloride: 102 mmol/L (ref 96–106)
Creatinine, Ser: 0.81 mg/dL (ref 0.57–1.00)
Glucose: 127 mg/dL — ABNORMAL HIGH (ref 70–99)
Potassium: 4.4 mmol/L (ref 3.5–5.2)
Sodium: 142 mmol/L (ref 134–144)
eGFR: 71 mL/min/1.73 (ref >59)

## 2023-09-27 LAB — MAGNESIUM: Magnesium: 1.9 mg/dL (ref 1.6–2.3)

## 2023-09-27 LAB — BRAIN NATRIURETIC PEPTIDE: BNP: 46.2 pg/mL (ref 0.0–100.0)

## 2023-09-28 ENCOUNTER — Ambulatory Visit (HOSPITAL_BASED_OUTPATIENT_CLINIC_OR_DEPARTMENT_OTHER): Payer: Self-pay | Admitting: Family

## 2023-09-28 ENCOUNTER — Telehealth (HOSPITAL_COMMUNITY): Payer: Self-pay | Admitting: *Deleted

## 2023-09-28 ENCOUNTER — Encounter (HOSPITAL_COMMUNITY): Payer: Self-pay | Admitting: *Deleted

## 2023-09-28 DIAGNOSIS — R0609 Other forms of dyspnea: Secondary | ICD-10-CM

## 2023-09-28 DIAGNOSIS — R600 Localized edema: Secondary | ICD-10-CM

## 2023-09-28 NOTE — Telephone Encounter (Signed)
 Letter with instructions for upcoming stress test sent via [employer].  Ashley Johns

## 2023-10-01 DIAGNOSIS — R600 Localized edema: Secondary | ICD-10-CM | POA: Diagnosis not present

## 2023-10-02 LAB — BASIC METABOLIC PANEL WITH GFR
BUN/Creatinine Ratio: 20 (ref 12–28)
BUN: 22 mg/dL (ref 8–27)
CO2: 21 mmol/L (ref 20–29)
Calcium: 9.4 mg/dL (ref 8.7–10.3)
Chloride: 103 mmol/L (ref 96–106)
Creatinine, Ser: 1.08 mg/dL — ABNORMAL HIGH (ref 0.57–1.00)
Glucose: 133 mg/dL — ABNORMAL HIGH (ref 70–99)
Potassium: 4 mmol/L (ref 3.5–5.2)
Sodium: 140 mmol/L (ref 134–144)
eGFR: 50 mL/min/1.73 — ABNORMAL LOW (ref >59)

## 2023-10-04 LAB — BRAIN NATRIURETIC PEPTIDE: BNP: 12.2 pg/mL (ref 0.0–100.0)

## 2023-10-05 ENCOUNTER — Other Ambulatory Visit (HOSPITAL_BASED_OUTPATIENT_CLINIC_OR_DEPARTMENT_OTHER): Payer: Self-pay | Admitting: Family

## 2023-10-05 DIAGNOSIS — R0609 Other forms of dyspnea: Secondary | ICD-10-CM

## 2023-10-05 DIAGNOSIS — R071 Chest pain on breathing: Secondary | ICD-10-CM

## 2023-10-06 ENCOUNTER — Ambulatory Visit (HOSPITAL_COMMUNITY)
Admission: RE | Admit: 2023-10-06 | Discharge: 2023-10-06 | Disposition: A | Discharge status: Home / Self Care | Source: Ambulatory Visit | Attending: Cardiology | Admitting: Cardiology

## 2023-10-06 DIAGNOSIS — R0609 Other forms of dyspnea: Secondary | ICD-10-CM | POA: Insufficient documentation

## 2023-10-06 DIAGNOSIS — R071 Chest pain on breathing: Secondary | ICD-10-CM | POA: Diagnosis not present

## 2023-10-06 LAB — MYOCARDIAL PERFUSION IMAGING
LV dias vol: 71 mL (ref 46–106)
LV sys vol: 19 mL (ref 3.8–5.2)
Nuc Stress EF: 73 %
Peak HR: 106 {beats}/min
Rest HR: 90 {beats}/min
Rest Nuclear Isotope Dose: 10.7 mCi
SDS: 0
SRS: 1
SSS: 0
ST Depression (mm): 1 mm
Stress Nuclear Isotope Dose: 30.8 mCi
TID: 1

## 2023-10-06 MED ORDER — REGADENOSON 0.4 MG/5ML IV SOLN
INTRAVENOUS | Status: AC
  Filled 2023-10-06: qty 5

## 2023-10-06 MED ORDER — TECHNETIUM TC 99M TETROFOSMIN IV KIT
30.8000 | PACK | Freq: Once | INTRAVENOUS | Status: AC | PRN
  Administered 2023-10-06: 30.8 via INTRAVENOUS

## 2023-10-06 MED ORDER — REGADENOSON 0.4 MG/5ML IV SOLN
0.4000 mg | Freq: Once | INTRAVENOUS | Status: AC
  Administered 2023-10-06: 0.4 mg via INTRAVENOUS

## 2023-10-06 MED ORDER — TECHNETIUM TC 99M TETROFOSMIN IV KIT
10.7000 | PACK | Freq: Once | INTRAVENOUS | Status: AC | PRN
  Administered 2023-10-06: 10.7 via INTRAVENOUS

## 2023-10-07 MED ORDER — FUROSEMIDE 20 MG PO TABS: ORAL_TABLET | ORAL | Status: AC

## 2023-10-07 MED ORDER — POTASSIUM CHLORIDE ER 10 MEQ PO TBCR: EXTENDED_RELEASE_TABLET | ORAL | Status: AC

## 2023-10-08 ENCOUNTER — Ambulatory Visit (HOSPITAL_BASED_OUTPATIENT_CLINIC_OR_DEPARTMENT_OTHER): Payer: Self-pay | Admitting: Family

## 2023-10-12 ENCOUNTER — Ambulatory Visit (HOSPITAL_BASED_OUTPATIENT_CLINIC_OR_DEPARTMENT_OTHER): Admitting: Nurse Practitioner

## 2023-10-12 DIAGNOSIS — R600 Localized edema: Secondary | ICD-10-CM | POA: Diagnosis not present

## 2023-10-12 LAB — BASIC METABOLIC PANEL WITH GFR
BUN/Creatinine Ratio: 20 (ref 12–28)
BUN: 16 mg/dL (ref 8–27)
CO2: 23 mmol/L (ref 20–29)
Calcium: 9.5 mg/dL (ref 8.7–10.3)
Chloride: 100 mmol/L (ref 96–106)
Creatinine, Ser: 0.82 mg/dL (ref 0.57–1.00)
Glucose: 84 mg/dL (ref 70–99)
Potassium: 4.3 mmol/L (ref 3.5–5.2)
Sodium: 141 mmol/L (ref 134–144)
eGFR: 70 mL/min/1.73 (ref >59)

## 2023-10-22 ENCOUNTER — Telehealth: Payer: Self-pay

## 2023-10-22 ENCOUNTER — Ambulatory Visit: Payer: Self-pay

## 2023-10-22 NOTE — Telephone Encounter (Signed)
 Patient daughter called in today about concerns about her taking a double dose of her blood pressure medication on 10/21/2023 and it made her sick. She wanted to know if was ok to take her blood pressure medication today?

## 2023-10-22 NOTE — Telephone Encounter (Signed)
 Patient requesting call back to confirm if mother should take her BP med tonight as she took a double dose yesterday. BP is currently 120/76. States can leave a voicemail if she is unavailable.  FYI Only or Action Required?: Action required by provider: clinical question for provider.  Patient was last seen in primary care on 09/08/2023 by Ngetich, Roxan BROCKS, NP.  Called Nurse Triage reporting Cough.  Symptoms began several days ago.  Interventions attempted: Rest, hydration, or home remedies.  Symptoms are: unchanged.  Triage Disposition: See PCP When Office is Open (Within 3 Days) (overriding Home Care)  Patient/caregiver understands and will follow disposition?: Yes   Reason for Disposition  Cough  Answer Assessment - Initial Assessment Questions Pt reports she accidentally doubled her BP yesterday, is asking if she should take it today. States BP was 120/76. Takes medication at night.   States cough does not change when she uses her inhaler. Patient denies higher acuity questions. ED precautions reviewed, pt verbalized understanding.  1. ONSET: When did the cough begin?      3 days  2. SEVERITY: How bad is the cough today?      More severe when trying to talk or eat  3. SPUTUM: Describe the color of your sputum (e.g., none, dry cough; clear, white, yellow, green)     Denies  4. HEMOPTYSIS: Are you coughing up any blood? If Yes, ask: How much? (e.g., flecks, streaks, tablespoons, etc.)     Denies  5. DIFFICULTY BREATHING: Are you having difficulty breathing? If Yes, ask: How bad is it? (e.g., mild, moderate, severe)      At baseline, sob with exertion  6. FEVER: Do you have a fever? If Yes, ask: What is your temperature, how was it measured, and when did it start?     Denies  7. CARDIAC HISTORY: Do you have any history of heart disease? (e.g., heart attack, congestive heart failure)      Denies, recently had cardiology workup  8. LUNG HISTORY: Do  you have any history of lung disease?  (e.g., pulmonary embolus, asthma, emphysema)     Has albuterol  to use when she has SOB with exertion  9. PE RISK FACTORS: Do you have a history of blood clots? (or: recent major surgery, recent prolonged travel, bedridden)     Denies  Protocols used: Cough - Acute Non-Productive-A-AH

## 2023-10-22 NOTE — Telephone Encounter (Signed)
 Summary: Please call daughter to advise [phone]

## 2023-10-22 NOTE — Telephone Encounter (Deleted)
 Source  Ashley Johns (Patient)   Subject  Ashley Johns (Patient)   Topic  Clinical - Pink Word Triage    Communication  Reason for Triage: patient stated she took her blood pressure one too many this morning, and her blood pressure is too low she needs to speak to someone to see if she should take her evening bp meds or not

## 2023-10-22 NOTE — Telephone Encounter (Signed)
 See previous message

## 2023-10-22 NOTE — Telephone Encounter (Signed)
 Provider called patient states does not recall which blood pressure pill she took twice yesterday but states B/p was 117/73 and could not sleep  felt really bad last night. She feels better today but wanted to wait for provider to return call prior to taking medication this evening.Patient unable to notify provider which medications she takes in the evening except describing the color and shape.she did not have medication with her during the call states was in the car.Advised to hold blood pressure medication this evening unless  B/p > 140/90.

## 2023-10-25 ENCOUNTER — Encounter: Payer: Self-pay | Admitting: Orthopedic Surgery

## 2023-10-25 ENCOUNTER — Ambulatory Visit (INDEPENDENT_AMBULATORY_CARE_PROVIDER_SITE_OTHER): Admitting: Orthopedic Surgery

## 2023-10-25 VITALS — BP 136/92 | HR 88 | Temp 97.9°F | Ht 61.0 in | Wt 165.6 lb

## 2023-10-25 DIAGNOSIS — R051 Acute cough: Secondary | ICD-10-CM | POA: Diagnosis not present

## 2023-10-25 DIAGNOSIS — R062 Wheezing: Secondary | ICD-10-CM | POA: Diagnosis not present

## 2023-10-25 MED ORDER — BENZONATATE 100 MG PO CAPS: 100.0000 mg | ORAL_CAPSULE | Freq: Two times a day (BID) | ORAL | 0 refills | Status: DC | PRN

## 2023-10-25 MED ORDER — PREDNISONE 20 MG PO TABS: ORAL_TABLET | ORAL | 0 refills | Status: AC

## 2023-10-25 MED ORDER — MONTELUKAST SODIUM 10 MG PO TABS: 10.0000 mg | ORAL_TABLET | Freq: Every day | ORAL | 1 refills | Status: AC

## 2023-10-25 MED ORDER — ALBUTEROL SULFATE HFA 108 (90 BASE) MCG/ACT IN AERS: 1.0000 | INHALATION_SPRAY | Freq: Four times a day (QID) | RESPIRATORY_TRACT | 3 refills | Status: DC | PRN

## 2023-10-25 NOTE — Patient Instructions (Addendum)
 Taker prednisone  in the AM  Restart montelukast  (Singulair ) for allergies  You may use benzonatate   as needed to calm cough> take one tonight   You may use albuterol  inhaler for wheezing

## 2023-10-25 NOTE — Progress Notes (Signed)
 Careteam: Patient Care Team: Ngetich, Roxan BROCKS, NP as PCP - General (Family Medicine) Alvan Ronal BRAVO, MD (Inactive) as PCP - Cardiology (Cardiology) Lynnell Nottingham, MD as Consulting Physician (Dermatology)  Seen by: Greig Cluster, AGNP-C  PLACE OF SERVICE:  [facility]  Advanced Directive information    Allergies  Allergen Reactions   Codeine Nausea Only    unknown   Tramadol Nausea Only   Aspirin Rash    Chief Complaint  Patient presents with   Acute Visit    Patient having a lot of urinary frequency when coughing. Pt stated that none of this started happening until she had the pneumonia shot. Pt needs refill for inhaler stated she doesn't 2 puffs      HPI: Patient is a 88 y.o. female seen today for acute visit for cough.   Discussed the use of AI scribe software for clinical note transcription with the patient, who gave verbal consent to proceed.  History of Present Illness   Ashley Johns is an 88 year old female who presents with a persistent cough. She is accompanied by her daughter.  She developed a persistent cough approximately one week ago. Cough described as dry. It is keeping her up at all times of the night. No fever, chest pain, or cold symptoms such as sore throat or rhinorrhea. She feels tired and experiences tightness in her abdominal area when coughing.  She has a history of seasonal allergies, which worsen during seasonal changes. She takes Singulair  (montelukast ) 10 mg at bedtime for allergies but had stopped taking. Tessalon  Perles (benzonatate ) were prescribed but she has not tried medication.   Her medication regimen includes furosemide  for leg swelling. She has seen a cardiologist but denies any heart failure diagnosis.   She uses albuterol  for wheezing but is currently out and needs a refill.  She denies any history of asthma but acknowledges wheezing, noted during a previous visit to an allergy  and asthma center in April. At that time, she was  prescribed two inhalers, later combined into one due to insurance cost issues.       Review of Systems:  Review of Systems  Constitutional:  Negative for fever.  HENT:  Positive for congestion. Negative for ear pain and sore throat.   Respiratory:  Positive for cough and wheezing. Negative for sputum production and shortness of breath.   Cardiovascular:  Negative for chest pain.  Gastrointestinal:  Negative for diarrhea and nausea.  Musculoskeletal:  Negative for myalgias.  Neurological:  Negative for dizziness and headaches.  Psychiatric/Behavioral:  Negative for depression. The patient is not nervous/anxious.     Past Medical History:  Diagnosis Date   Eczema    Per [facility] new patient packet   Graves disease    Per [facility] new patient packet   High blood pressure    Per [facility] new patient packet   Hypertension    Past Surgical History:  Procedure Laterality Date   TOTAL ABDOMINAL HYSTERECTOMY Bilateral 1970's   TUBAL LIGATION     Social History:   reports that she has quit smoking. She has never used smokeless tobacco. She reports that she does not drink alcohol and does not use drugs.  Family History  Problem Relation Age of Onset   Tuberculosis Mother    Hypertension Father     Medications: Patient's Medications  New Prescriptions   No medications on file  Previous Medications   ACETAMINOPHEN  (TYLENOL ) 650 MG CR TABLET    Take  650 mg by mouth every 8 (eight) hours as needed for pain.   ALBUTEROL  (VENTOLIN  HFA) 108 (90 BASE) MCG/ACT INHALER    Inhale into the lungs every 6 (six) hours as needed for wheezing or shortness of breath.   AMLODIPINE  (NORVASC ) 10 MG TABLET    Take 1 tablet (10 mg total) by mouth daily.   ATORVASTATIN  (LIPITOR) 20 MG TABLET    Take 1 tablet (20 mg total) by mouth daily.   AZELASTINE  (ASTELIN ) 0.1 % NASAL SPRAY    Place 2 sprays into both nostrils 2 (two) times daily. Use in each nostril as directed   BENZONATATE  (TESSALON ) 100 MG CAPSULE    Take  100 mg by mouth 2 (two) times daily. Morning and night   BUDESONIDE -GLYCOPYRROLATE-FORMOTEROL  (BREZTRI  AEROSPHERE) 160-9-4.8 MCG/ACT AERO INHALER    Inhale 2 puffs into the lungs in the morning and at bedtime. with spacer and rinse mouth afterwards.   CARVEDILOL  (COREG ) 6.25 MG TABLET    Take 1 tablet (6.25 mg total) by mouth 2 (two) times daily.   EPINEPHRINE  (EPIPEN  2-PAK) 0.3 MG/0.3 ML IJ SOAJ INJECTION    Inject 0.3 mg into the muscle as needed. Inject 0.3mg  intramuscular route once as needed.   FLUTICASONE  (FLONASE ) 50 MCG/ACT NASAL SPRAY    Place 2 sprays into both nostrils 2 (two) times daily as needed for allergies or rhinitis.   FUROSEMIDE  (LASIX ) 20 MG TABLET    1 tablet Monday, Wednesday and friday   IPRATROPIUM (ATROVENT ) 0.03 % NASAL SPRAY    Place 1-2 sprays into both nostrils 2 (two) times daily as needed (nasal drainage).   MELOXICAM  (MOBIC ) 7.5 MG TABLET    Take 1 tablet (7.5 mg total) by mouth daily as needed for pain. May increase to two daily if ineffective and notify provider   MOMETASONE  (ELOCON ) 0.1 % CREAM    Apply 1 Application topically daily as needed.   MONTELUKAST  (SINGULAIR ) 10 MG TABLET    Take 1 tablet (10 mg total) by mouth at bedtime.   OLMESARTAN  (BENICAR ) 40 MG TABLET    Take 1 tablet (40 mg total) by mouth daily.   POTASSIUM CHLORIDE  (KLOR-CON ) 10 MEQ TABLET    1 tablet Monday, Wednesday and friday  Modified Medications   No medications on file  Discontinued Medications   No medications on file    Physical Exam:  Vitals:   10/25/23 1516  BP: (!) 142/96  Temp: 97.9 F (36.6 C)  Weight: 165 lb 9.6 oz (75.1 kg)  Height: 5' 1 (1.549 m)   Body mass index is 31.29 kg/m. Wt Readings from Last 3 Encounters:  10/25/23 165 lb 9.6 oz (75.1 kg)  09/24/23 167 lb 8 oz (76 kg)  09/08/23 168 lb 3.2 oz (76.3 kg)    Physical Exam Vitals reviewed.  Constitutional:      General: She is not in acute distress. HENT:     Head: Normocephalic.     Comments:  HOH Eyes:     General:        Right eye: No discharge.        Left eye: No discharge.  Cardiovascular:     Rate and Rhythm: Normal rate and regular rhythm.     Pulses: Normal pulses.     Heart sounds: Normal heart sounds.  Pulmonary:     Effort: Pulmonary effort is normal. No respiratory distress.     Breath sounds: Wheezing present. No rhonchi or rales.  Abdominal:  Palpations: Abdomen is soft.  Musculoskeletal:     Cervical back: Neck supple.     Right lower leg: No edema.     Left lower leg: No edema.  Skin:    General: Skin is warm.     Capillary Refill: Capillary refill takes less than 2 seconds.  Neurological:     General: No focal deficit present.     Mental Status: She is alert.     Gait: Gait abnormal.  Psychiatric:        Mood and Affect: Mood normal.     Labs reviewed: Basic Metabolic Panel: Recent Labs    09/08/23 1157 09/24/23 1330 10/01/23 1123 10/12/23 1056  NA  --  142 140 141  K  --  4.4 4.0 4.3  CL  --  102 103 100  CO2  --  22 21 23   GLUCOSE  --  127* 133* 84  BUN  --  15 22 16   CREATININE  --  0.81 1.08* 0.82  CALCIUM   --  9.8 9.4 9.5  MG  --  1.9  --   --   TSH 0.72  --   --   --    Liver Function Tests: Recent Labs    02/17/23 1005 07/26/23 0956  AST 13 15  ALT 11 10  ALKPHOS  --  143*  BILITOT 0.4 0.3  PROT 7.8 8.3  ALBUMIN  --  4.2   No results for input(s): LIPASE, AMYLASE in the last 8760 hours. No results for input(s): AMMONIA in the last 8760 hours. CBC: Recent Labs    02/17/23 1005 09/08/23 1157  WBC 5.6 5.2  NEUTROABS 3,226 3,208  HGB 13.7 13.2  HCT 42.1 41.2  MCV 92.5 94.5  PLT 260 220   Lipid Panel: Recent Labs    02/17/23 1005 07/26/23 0956  CHOL 219* 169  HDL 69 58  LDLCALC 134* 97  TRIG 69 73  CHOLHDL 3.2  --    TSH: Recent Labs    09/08/23 1157  TSH 0.72   A1C: Lab Results  Component Value Date   HGBA1C 6.3 (H) 09/08/2023     Assessment/Plan 1. Acute cough (Primary) -  increased x 1 week - expiratory wheezing on exam - ? Bronchitis vs allergies - start prednisone  and tessalon  - predniSONE  (DELTASONE ) 20 MG tablet; Take 2 tablets (40 mg total) by mouth daily with breakfast for 5 days, THEN 1 tablet (20 mg total) daily with breakfast for 5 days.  Dispense: 15 tablet; Refill: 0 - benzonatate  (TESSALON ) 100 MG capsule; Take 1 capsule (100 mg total) by mouth 2 (two) times daily as needed for cough. Morning and night  Dispense: 20 capsule; Refill: 0  2. Wheezing - see above - followed by asthma and allergy  - see above - stopped Singulair   - albuterol  (VENTOLIN  HFA) 108 (90 Base) MCG/ACT inhaler; Inhale 1-2 puffs into the lungs every 6 (six) hours as needed for wheezing or shortness of breath.  Dispense: 6.7 g; Refill: 3 - montelukast  (SINGULAIR ) 10 MG tablet; Take 1 tablet (10 mg total) by mouth at bedtime.  Dispense: 90 tablet; Refill: 1  Total time: 26 minutes. Greater than 50% of total time spent doing patient education regarding acute cough and wheezing including symptom/medication management.     Next appt: Visit date not found  Arthelia Callicott Gil BODILY  [facility] [phone]

## 2023-11-25 ENCOUNTER — Other Ambulatory Visit: Payer: Self-pay

## 2023-11-25 ENCOUNTER — Ambulatory Visit: Admitting: Allergy

## 2023-11-25 ENCOUNTER — Encounter: Payer: Self-pay | Admitting: Allergy

## 2023-11-25 VITALS — BP 110/80 | HR 84 | Temp 98.1°F | Resp 18 | Ht 61.0 in | Wt 165.1 lb

## 2023-11-25 DIAGNOSIS — J3089 Other allergic rhinitis: Secondary | ICD-10-CM

## 2023-11-25 DIAGNOSIS — J300 Vasomotor rhinitis: Secondary | ICD-10-CM | POA: Diagnosis not present

## 2023-11-25 DIAGNOSIS — J4540 Moderate persistent asthma, uncomplicated: Secondary | ICD-10-CM | POA: Diagnosis not present

## 2023-11-25 DIAGNOSIS — R21 Rash and other nonspecific skin eruption: Secondary | ICD-10-CM

## 2023-11-25 MED ORDER — BREZTRI AEROSPHERE 160-9-4.8 MCG/ACT IN AERO: 2.0000 | INHALATION_SPRAY | Freq: Two times a day (BID) | RESPIRATORY_TRACT | 11 refills | Status: AC

## 2023-11-25 MED ORDER — IPRATROPIUM BROMIDE 0.03 % NA SOLN: 1.0000 | Freq: Two times a day (BID) | NASAL | 5 refills | Status: AC | PRN

## 2023-11-25 MED ORDER — FLUTICASONE PROPIONATE 50 MCG/ACT NA SUSP: 1.0000 | Freq: Every day | NASAL | 5 refills | Status: AC | PRN

## 2023-11-25 MED ORDER — ALBUTEROL SULFATE HFA 108 (90 BASE) MCG/ACT IN AERS: 2.0000 | INHALATION_SPRAY | RESPIRATORY_TRACT | 1 refills | Status: AC | PRN

## 2023-11-25 NOTE — Patient Instructions (Addendum)
 Environmental allergies 2023 skin testing positive to dust mites and cockroaches.  Continue environmental control measures. Use over the counter antihistamines such as Zyrtec  (cetirizine ), Claritin (loratadine), Allegra (fexofenadine), or Xyzal (levocetirizine) daily as needed. May switch antihistamines every few months. Use Flonase  (fluticasone ) nasal spray 1-2 sprays per nostril once a day as needed for nasal congestion.  BROWN bottle Nasal saline spray (i.e., Simply Saline) or nasal saline lavage (i.e., NeilMed) is recommended as needed and prior to medicated nasal sprays..  Use Atrovent  (ipratropium) 0.03% 1-2 sprays per nostril twice a day as needed for runny nose/drainage. WHITE bottle.   Rash Continue proper skin care.  Use mometasone  0.1% cream once a day as needed for rash flares. Do not use on the face, neck, armpits or groin area. Do not use more than 1 week in a row.   Breathing: Daily controller medication(s): Breztri  2 puffs twice a day with spacer and rinse mouth afterwards.  Samples given. Will fax over the [place] to [place] to see if you qualify for any patient's assistance.  This replaces the Symbicort  + Spiriva . If NOT covered, then let me know I will send in Symbicort  + Spiriva  again.  May use albuterol  rescue inhaler 2 puffs or nebulizer every 4 to 6 hours as needed for shortness of breath, chest tightness, coughing, and wheezing. May use albuterol  rescue inhaler 2 puffs 5 to 15 minutes prior to strenuous physical activities. Monitor frequency of use - if you need to use it more than twice per week on a consistent basis let us  know.  Breathing control goals:  Full participation in all desired activities (may need albuterol  before activity) Albuterol  use two times or less a week on average (not counting use with activity) Cough interfering with sleep two times or less a month Oral steroids no more than once a year No hospitalizations  Follow up in 4 months or  sooner if needed.

## 2023-11-25 NOTE — Progress Notes (Signed)
 Follow Up Note  RE: Ashley Johns MRN: [id number] DOB: April 29, 1935 Date of Office Visit: 11/25/2023  Referring provider: Leonarda Roxan JAYSON, NP Primary care provider: Leonarda Roxan JAYSON, NP  Chief Complaint: Follow-up (Pt presents to the office with daughter. No concerns or complaints just need refills on inhalers and nasal sprays.)  History of Present Illness: I had the pleasure of seeing Ashley Johns for a follow up visit at [facility] on 11/25/2023. Ashley Johns is a 88 y.o. female, who is being followed for allergic and vasomotor rhinitis, rash, dyspnea on exertion. Ashley Johns previous allergy  office visit was on 07/27/2023 with Dr. Luke. Ashley Johns is a regular follow up visit. Ashley Johns is accompanied Ashley Johns by Ashley Johns daughter who provided/contributed to the history.   Discussed the use of AI scribe software for clinical note transcription with the patient, who gave verbal consent to proceed.    Ashley Johns has not used Ashley Johns inhaler for about two weeks since Ashley Johns cough ceased.   Ashley Johns experiences sinus issues, particularly at night when one side of Ashley Johns nose becomes blocked. Ashley Johns uses nasal sprays, having used the last of Ashley Johns supply Ashley Johns morning. Ashley Johns describes having two types of nasal sprays, one in a white bottle for drainage and one in a brown bottle for stuffiness. Ashley Johns also takes Claritin occasionally when Ashley Johns wakes up with a stuffy nose, noting that it helps alleviate Ashley Johns symptoms.  Ashley Johns reports experiencing dry and itchy skin on Ashley Johns head, which Ashley Johns attributes to dry skin. Ashley Johns uses a steroid cream only during breakouts, which occur when Ashley Johns is upset.  Ashley Johns daughter mentions that Ashley Johns lives alone but has a guardian who stays with Ashley Johns sometimes. Ashley Johns has Medicare and [employer].   Ashley Johns does like Breztri  inhaler better than the Symbicort  and Spiriva  but it was too expensive to pay out of pocket.     Assessment and Plan: Ashley Johns is a 88 y.o. female with: Perennial allergic rhinitis/vasomotor  rhinitis Past history - Perennial rhinitis x 4 years. Tried antihistamines and ipratropium with some benefit. No prior ENT evaluation. 2023 skin testing positive to dust mites and cockroaches.  Interim history - Chronic nasal congestion with effective relief from nasal sprays and Claritin. Declined ENT referral. Continue environmental control measures. Use over the counter antihistamines such as Zyrtec  (cetirizine ), Claritin (loratadine), Allegra (fexofenadine), or Xyzal (levocetirizine) daily as needed. May switch antihistamines every few months. Use Flonase  (fluticasone ) nasal spray 1-2 sprays per nostril once a day as needed for nasal congestion.  BROWN bottle Nasal saline spray (i.e., Simply Saline) or nasal saline lavage (i.e., NeilMed) is recommended as needed and prior to medicated nasal sprays..  Use Atrovent  (ipratropium) 0.03% 1-2 sprays per nostril twice a day as needed for runny nose/drainage. WHITE bottle.    Rash and other nonspecific skin eruption Past history - Rash x 2 years on arms and legs. Flares about once per month and uses mometasone  prn with good benefit. Patient thought Ashley Johns saw dermatology in the past and was told to use eczema lotion.  Interim history - some pruritus and limited mometasone  cream use. Continue roper skin care.  Use mometasone  0.1% cream once a day as needed for rash flares. Do not use on the face, neck, armpits or groin area. Do not use more than 1 week in a row.    Moderate persistent reactive airway disease without complication Past history - Uses albuterol  prn with some benefit - mainly with exertion. Follows with cardiology. 2023 spirometry  showed: severe restrictive disease with 6% improvement in FEV1 post bronchodilator treatment. Clinically feeling improved. Mild/moderate aortic stenosis per cards.  Interim history - prefers Breztri  and it seems to be helping but too expensive.  Ashley Johns's spirometry showed some restriction.  Daily controller  medication(s): Breztri  2 puffs twice a day with spacer and rinse mouth afterwards.  Samples given. Will fax over the application to [place] to see if you qualify for any patient's assistance.  Ashley Johns replaces the Symbicort  + Spiriva . If NOT covered, then let me know I will send in Symbicort  + Spiriva  again. May use albuterol  rescue inhaler 2 puffs or nebulizer every 4 to 6 hours as needed for shortness of breath, chest tightness, coughing, and wheezing. May use albuterol  rescue inhaler 2 puffs 5 to 15 minutes prior to strenuous physical activities. Monitor frequency of use - if you need to use it more than twice per week on a consistent basis let us  know.   Return in about 4 months (around 03/27/2024).  Meds ordered Ashley Johns encounter  Medications   ipratropium (ATROVENT ) 0.03 % nasal spray    Sig: Place 1-2 sprays into both nostrils 2 (two) times daily as needed (nasal drainage).    Dispense:  30 mL    Refill:  5   fluticasone  (FLONASE ) 50 MCG/ACT nasal spray    Sig: Place 1-2 sprays into both nostrils daily as needed (nasal congestion).    Dispense:  16 g    Refill:  5   albuterol  (VENTOLIN  HFA) 108 (90 Base) MCG/ACT inhaler    Sig: Inhale 2 puffs into the lungs every 4 (four) hours as needed for wheezing or shortness of breath (coughing fits).    Dispense:  18 g    Refill:  1   budesonide -glycopyrrolate-formoterol  (BREZTRI  AEROSPHERE) 160-9-4.8 MCG/ACT AERO inhaler    Sig: Inhale 2 puffs into the lungs in the morning and at bedtime. with spacer and rinse mouth afterwards.    Dispense:  10.7 g    Refill:  11   Lab Orders  No laboratory test(s) ordered Ashley Johns    Diagnostics: Spirometry:  Tracings reviewed. Ashley Johns effort: It was hard to get consistent efforts and there is a question as to whether Ashley Johns reflects a maximal maneuver. FVC: 1.01L FEV1: 0.85L, 61% predicted FEV1/FVC ratio: 84% Interpretation: Spirometry consistent with possible restrictive disease.  Please see scanned spirometry  results for details. Results discussed with patient/family.   Medication List:  Current Outpatient Medications  Medication Sig Dispense Refill   acetaminophen  (TYLENOL ) 650 MG CR tablet Take 650 mg by mouth every 8 (eight) hours as needed for pain.     albuterol  (VENTOLIN  HFA) 108 (90 Base) MCG/ACT inhaler Inhale 2 puffs into the lungs every 4 (four) hours as needed for wheezing or shortness of breath (coughing fits). 18 g 1   amLODipine  (NORVASC ) 10 MG tablet Take 1 tablet (10 mg total) by mouth daily. 180 tablet 3   atorvastatin  (LIPITOR) 20 MG tablet Take 1 tablet (20 mg total) by mouth daily. 90 tablet 1   azelastine  (ASTELIN ) 0.1 % nasal spray Place 2 sprays into both nostrils 2 (two) times daily. Use in each nostril as directed 30 mL 5   benzonatate  (TESSALON ) 100 MG capsule Take 1 capsule (100 mg total) by mouth 2 (two) times daily as needed for cough. Morning and night 20 capsule 0   budesonide -glycopyrrolate-formoterol  (BREZTRI  AEROSPHERE) 160-9-4.8 MCG/ACT AERO inhaler Inhale 2 puffs into the lungs in the morning and at bedtime. with spacer  and rinse mouth afterwards. 10.7 g 11   carvedilol  (COREG ) 6.25 MG tablet Take 1 tablet (6.25 mg total) by mouth 2 (two) times daily. 180 tablet 3   EPINEPHrine  (EPIPEN  2-PAK) 0.3 mg/0.3 mL IJ SOAJ injection Inject 0.3 mg into the muscle as needed. Inject 0.3mg  intramuscular route once as needed. 1 each 1   fluticasone  (FLONASE ) 50 MCG/ACT nasal spray Place 1-2 sprays into both nostrils daily as needed (nasal congestion). 16 g 5   furosemide  (LASIX ) 20 MG tablet 1 tablet Monday, Wednesday and friday     meloxicam  (MOBIC ) 7.5 MG tablet Take 1 tablet (7.5 mg total) by mouth daily as needed for pain. May increase to two daily if ineffective and notify provider 60 tablet 1   mometasone  (ELOCON ) 0.1 % cream Apply 1 Application topically daily as needed. 45 g 3   montelukast  (SINGULAIR ) 10 MG tablet Take 1 tablet (10 mg total) by mouth at bedtime. 90  tablet 1   olmesartan  (BENICAR ) 40 MG tablet Take 1 tablet (40 mg total) by mouth daily. 90 tablet 3   potassium chloride  (KLOR-CON ) 10 MEQ tablet 1 tablet Monday, Wednesday and friday     ipratropium (ATROVENT ) 0.03 % nasal spray Place 1-2 sprays into both nostrils 2 (two) times daily as needed (nasal drainage). 30 mL 5   No current facility-administered medications for Ashley Johns visit.   Allergies: Allergies  Allergen Reactions   Codeine Nausea Only    unknown   Tramadol Nausea Only   Aspirin Rash   I reviewed Ashley Johns past medical history, social history, family history, and environmental history and no significant changes have been reported from Ashley Johns previous visit.  Review of Systems  Constitutional:  Negative for appetite change, chills, fever and unexpected weight change.  HENT:  Positive for congestion, postnasal drip and rhinorrhea.   Eyes:  Negative for itching.  Respiratory:  Negative for cough, chest tightness, shortness of breath and wheezing.   Cardiovascular:  Negative for chest pain.  Gastrointestinal:  Negative for abdominal pain.  Genitourinary:  Negative for difficulty urinating.  Skin:  Negative for rash.  Allergic/Immunologic: Positive for environmental allergies. Negative for food allergies.  Neurological:  Negative for headaches.    Objective: BP 110/80 (BP Location: Right Arm, Patient Position: Sitting, Cuff Size: Normal)   Pulse 84   Temp 98.1 F (36.7 C) (Temporal)   Resp 18   Ht 5' 1 (1.549 m)   Wt 165 lb 1.9 oz (74.9 kg)   SpO2 90%   BMI 31.20 kg/m  Body mass index is 31.2 kg/m. Physical Exam Vitals and nursing note reviewed.  Constitutional:      Appearance: Normal appearance. Ashley Johns is well-developed.  HENT:     Head: Normocephalic and atraumatic.     Right Ear: Tympanic membrane and external ear normal.     Left Ear: Tympanic membrane and external ear normal.     Nose: Nose normal.     Mouth/Throat:     Mouth: Mucous membranes are moist.      Pharynx: Oropharynx is clear.  Eyes:     Conjunctiva/sclera: Conjunctivae normal.  Cardiovascular:     Rate and Rhythm: Normal rate and regular rhythm.     Heart sounds: Normal heart sounds. No murmur heard.    No friction rub. No gallop.  Pulmonary:     Effort: Pulmonary effort is normal.     Breath sounds: Normal breath sounds. No wheezing, rhonchi or rales.  Musculoskeletal:     Cervical  back: Neck supple.  Skin:    General: Skin is warm.     Findings: No rash.  Neurological:     Mental Status: Ashley Johns is alert and oriented to person, place, and time.  Psychiatric:        Behavior: Behavior normal.    Previous notes and tests were reviewed. The plan was reviewed with the patient/family, and all questions/concerned were addressed.  It was my pleasure to see Mende Ashley Johns and participate in Ashley Johns care. Please feel free to contact me with any questions or concerns.  Sincerely,  Orlan Cramp, DO Allergy  & Immunology  [facility]  [place] office: [phone] [facility] office: [phone]

## 2023-11-30 ENCOUNTER — Telehealth: Payer: Self-pay

## 2023-11-30 NOTE — Telephone Encounter (Signed)
 Fyi: [place] communication from [place] patients medication has been approved 11/26/2023 thru 02/08/2025. Form sent to scan center

## 2023-11-30 NOTE — Telephone Encounter (Signed)
 Noted

## 2023-12-05 NOTE — Progress Notes (Unsigned)
 Cardiology Office Note:    Date:  12/06/2023   ID:  Ashley Johns, DOB 14-May-1935, MRN [id number]  PCP:  Leonarda Roxan JAYSON, NP    [facility] Providers Cardiologist:  Annabella Scarce, MD Cardiology APP:  Percy Rosaline HERO, NP { Click to update primary MD,subspecialty MD or APP then REFRESH:1}    Referring MD: Ngetich, Roxan JAYSON, NP   Chief complaint: 39-month follow-up of chest tightness/DOE     History of Present Illness:   Ashley Johns is a 88 y.o. female with a hx of HTN, Graves' disease s/p thyroidectomy, presenting to office for 7-month follow-up.  First evaluated by cardiology with Dr. Alvan on 05/01/2021 for DOE and HTN. Subsequent SPECT study was low risk, no ischemia or infarction, and TTE showed an EF of 50-55%, hypokinesis of the inferolateral wall, normal RV, mild-moderate MR, aortic mean gradient 10 mmHg with a calcified valve.  ZIO monitoring ordered later in the year following palpitations showed frequent brief SVT corresponding with triggered events, predominant underlying rhythm sinus rhythm, average heart rate 83 bpm, longest interval SVT lasting 11.6 seconds.   ED visit 10/16/2022 following tachypnea, O2 saturations in the upper 80s on RA, slight tachycardia, BP elevations as high as 208/132, left leg swelling.  D-dimer 0.63 and BNP of 127.Treatment in ED consisted of oxygen, IV Solu-Medrol , hydralazine , albuterol , DuoNeb, and overnight admission for observation.  MRI revealed a highly vascular, up to 5.7 cm long axis, mildly expansile lesion of the right frontal bone, reported as indeterminant, but assuming no known malignancy then a benign vascular lesion of bone is favored.  Recommended noncontrast head CT for surveillance to document stability on a future outpatient basis. Discussed this finding at previous visit with patient and family, instructed them to follow up with PCP on this.  Follow-up 05/24/2023 patient reported leg swelling following start of  amlodipine , echo was ordered that showed LVEF 60-65%, no RWMA, mild LVH, RV mildly enlarged, moderately elevated PASP, RVSP 59 mmHg.  Diffuse R/L coronary cusps of aortic valve, with severe calcification of aortic valve.  Negative DVT study on 06/09/2023.  Most recently seen in the office by myself on 09/24/2023.  C/o worsening DOE with chest tightness, occasional palpitations and rare near-syncope.  Lasix  had been discontinued in July, restarted at this visit to see if symptoms improved. Myocardial Lexiscan  was normal, low risk, 1.0 mm horizontal ST depression observed, no evidence of ischemia or infarction.  Severe coronary calcifications were present in the LAD, LCx, RCA.  Seen by her PCP 10/25/2023, diagnosed with bronchitis versus allergies, started on prednisone .  ROS:   Please see the history of present illness.    All other systems reviewed and are negative.     Past Medical History:  Diagnosis Date   Eczema    Per [facility] new patient packet   Graves disease    Per [facility] new patient packet   High blood pressure    Per [facility] new patient packet   Hypertension     Past Surgical History:  Procedure Laterality Date   TOTAL ABDOMINAL HYSTERECTOMY Bilateral 1970's   TUBAL LIGATION      Current Medications: No outpatient medications have been marked as taking for the 12/06/23 encounter (Appointment) with Takima Encina E, NP.     Allergies:   Codeine, Tramadol, and Aspirin   Social History   Socioeconomic History   Marital status: Widowed    Spouse name: Not on file   Number of children: Not on file  Years of education: Not on file   Highest education level: Not on file  Occupational History   Not on file  Tobacco Use   Smoking status: Former   Smokeless tobacco: Never   Tobacco comments:    Smoked for 6 years, Quit at age 73  Vaping Use   Vaping status: Never Used  Substance and Sexual Activity   Alcohol use: Never   Drug use: Never   Sexual activity: Not Currently   Other Topics Concern   Not on file  Social History Narrative   Diet      Do you drink/eat things with caffeine: Yes      Marital Status: Widowed  What year were you married? 1972      Do you live in a house, apartment, assisted living, condo, trailer, etc.? Townhouse      Is it one or more stories? 2      How many persons live in your home? 3         Do you have any pets in your home?(please list): Yes, dog and fish      Highest level of education completed: High School      Current or past profession: Day care owner, foster parent      Do you exercise?: sometimes Type and how often: Walking      Living Will? No      DNR form? No  If not, do you wish to discuss one?: Yes      POA/HPOA forms? No      Difficulty bathing or dressing yourself? No      Difficulty preparing food or eating? No      Difficulty managing medications? No      Difficulty managing your finances? No      Difficulty affording your medications? No                     Social Drivers of Corporate Investment Banker Strain: Low Risk  (12/09/2017)   Overall Financial Resource Strain (CARDIA)    Difficulty of Paying Living Expenses: Not hard at all  Food Insecurity: No Food Insecurity (10/17/2022)   Hunger Vital Sign    Worried About Running Out of Food in the Last Year: Never true    Ran Out of Food in the Last Year: Never true  Transportation Needs: No Transportation Needs (10/17/2022)   PRAPARE - Administrator, Civil Service (Medical): No    Lack of Transportation (Non-Medical): No  Physical Activity: Unknown (09/08/2018)   Exercise Vital Sign    Days of Exercise per Week: 1 day    Minutes of Exercise per Session: Not on file  Stress: No Stress Concern Present (12/09/2017)   [employer] of Occupational Health - Occupational Stress Questionnaire    Feeling of Stress : Not at all  Social Connections: Not on file     Family History: The patient's ***family history  includes Hypertension in her father; Tuberculosis in her mother.  EKGs/Labs/Other Studies Reviewed:    The following studies were reviewed today: ***      Recent Labs: 07/26/2023: ALT 10 09/08/2023: Hemoglobin 13.2; Platelets 220; TSH 0.72 09/24/2023: Magnesium 1.9 10/01/2023: BNP 12.2 10/12/2023: BUN 16; Creatinine, Ser 0.82; Potassium 4.3; Sodium 141  Recent Lipid Panel    Component Value Date/Time   CHOL 169 07/26/2023 0956   TRIG 73 07/26/2023 0956   HDL 58 07/26/2023 0956   CHOLHDL 3.2 02/17/2023 1005  LDLCALC 97 07/26/2023 0956   LDLCALC 134 (H) 02/17/2023 1005     Risk Assessment/Calculations:    CHA2DS2-VASc Score =    {Click here to calculate score.  REFRESH note before signing. :1} This indicates a  % annual risk of stroke. The patient's score is based upon:       No BP recorded.  {Refresh Note OR Click here to enter BP  :1}***         Physical Exam:    VS:  There were no vitals taken for this visit.       Wt Readings from Last 3 Encounters:  11/25/23 165 lb 1.9 oz (74.9 kg)  10/25/23 165 lb 9.6 oz (75.1 kg)  09/24/23 167 lb 8 oz (76 kg)     GEN:  Well nourished, well developed in no acute distress HEENT: Normal NECK:  No carotid bruits CARDIAC:  S1-S2 normal, RRR, no murmurs, rubs, gallops RESPIRATORY:  Clear to auscultation without rales, wheezing or rhonchi  MUSCULOSKELETAL:  No edema; No deformity  SKIN: Warm and dry NEUROLOGIC:  Alert and oriented x 3 PSYCHIATRIC:  Normal affect       Assessment & Plan Coronary artery disease involving native coronary artery of native heart, unspecified whether angina present Lexiscan  Myoview  10/06/2023: Normal, low risk, no ischemia, no infarction, severe coronary calcifications present in the LAD, LCx, RCA. EKG: Symptoms Continue atorvastatin  20 mg daily Continue carvedilol  6.25 mg twice daily Primary hypertension BPs reported well-controlled at home *** Continue amlodipine  10 mg daily Continue  carvedilol  6.25 mg twice daily Continue Lasix  20 mg on MWF  -Continue potassium chloride  10 mEq with Lasix  Continue olmesartan  40 mg daily       {Are you ordering a CV Procedure (e.g. stress test, cath, DCCV, TEE, etc)?   Press F2        :[id number]}    Medication Adjustments/Labs and Tests Ordered: Current medicines are reviewed at length with the patient today.  Concerns regarding medicines are outlined above.  No orders of the defined types were placed in this encounter.  No orders of the defined types were placed in this encounter.   There are no Patient Instructions on file for this visit.   Signed, Miriam FORBES Shams, NP  12/06/2023 9:51 AM    [facility]

## 2023-12-06 ENCOUNTER — Encounter (HOSPITAL_BASED_OUTPATIENT_CLINIC_OR_DEPARTMENT_OTHER): Payer: Self-pay

## 2023-12-06 ENCOUNTER — Ambulatory Visit (INDEPENDENT_AMBULATORY_CARE_PROVIDER_SITE_OTHER): Admitting: Family

## 2023-12-06 ENCOUNTER — Encounter (HOSPITAL_BASED_OUTPATIENT_CLINIC_OR_DEPARTMENT_OTHER): Payer: Self-pay | Admitting: Emergency Medicine

## 2023-12-06 DIAGNOSIS — I2510 Atherosclerotic heart disease of native coronary artery without angina pectoris: Secondary | ICD-10-CM

## 2023-12-06 DIAGNOSIS — I10 Essential (primary) hypertension: Secondary | ICD-10-CM

## 2023-12-26 ENCOUNTER — Emergency Department (HOSPITAL_COMMUNITY)
Admission: EM | Admit: 2023-12-26 | Discharge: 2023-12-26 | Disposition: A | Discharge status: Home / Self Care | Attending: Emergency Medicine | Admitting: Emergency Medicine

## 2023-12-26 ENCOUNTER — Other Ambulatory Visit: Payer: Self-pay

## 2023-12-26 ENCOUNTER — Encounter (HOSPITAL_COMMUNITY): Payer: Self-pay

## 2023-12-26 ENCOUNTER — Emergency Department (HOSPITAL_COMMUNITY)

## 2023-12-26 DIAGNOSIS — R55 Syncope and collapse: Secondary | ICD-10-CM | POA: Insufficient documentation

## 2023-12-26 DIAGNOSIS — R002 Palpitations: Secondary | ICD-10-CM | POA: Insufficient documentation

## 2023-12-26 LAB — CBC WITH DIFFERENTIAL/PLATELET
Abs Immature Granulocytes: 0.01 K/uL (ref 0.00–0.07)
Basophils Absolute: 0 K/uL (ref 0.0–0.1)
Basophils Relative: 1 %
Eosinophils Absolute: 0.1 K/uL (ref 0.0–0.5)
Eosinophils Relative: 2 %
HCT: 44.3 % (ref 36.0–46.0)
Hemoglobin: 14.2 g/dL (ref 12.0–15.0)
Immature Granulocytes: 0 %
Lymphocytes Relative: 18 %
Lymphs Abs: 1.1 K/uL (ref 0.7–4.0)
MCH: 30.3 pg (ref 26.0–34.0)
MCHC: 32.1 g/dL (ref 30.0–36.0)
MCV: 94.7 fL (ref 80.0–100.0)
Monocytes Absolute: 0.4 K/uL (ref 0.1–1.0)
Monocytes Relative: 7 %
Neutro Abs: 4.4 K/uL (ref 1.7–7.7)
Neutrophils Relative %: 72 %
Platelets: 254 K/uL (ref 150–400)
RBC: 4.68 MIL/uL (ref 3.87–5.11)
RDW: 13.3 % (ref 11.5–15.5)
WBC: 6.1 K/uL (ref 4.0–10.5)
nRBC: 0 % (ref 0.0–0.2)

## 2023-12-26 LAB — COMPREHENSIVE METABOLIC PANEL WITH GFR
ALT: 13 U/L (ref 0–44)
AST: 20 U/L (ref 15–41)
Albumin: 3.3 g/dL — ABNORMAL LOW (ref 3.5–5.0)
Alkaline Phosphatase: 112 U/L (ref 38–126)
Anion gap: 11 (ref 5–15)
BUN: 18 mg/dL (ref 8–23)
CO2: 23 mmol/L (ref 22–32)
Calcium: 8.6 mg/dL — ABNORMAL LOW (ref 8.9–10.3)
Chloride: 104 mmol/L (ref 98–111)
Creatinine, Ser: 1.27 mg/dL — ABNORMAL HIGH (ref 0.44–1.00)
GFR, Estimated: 41 mL/min — ABNORMAL LOW (ref >60)
Glucose, Bld: 130 mg/dL — ABNORMAL HIGH (ref 70–99)
Potassium: 3.5 mmol/L (ref 3.5–5.1)
Sodium: 138 mmol/L (ref 135–145)
Total Bilirubin: 0.4 mg/dL (ref 0.0–1.2)
Total Protein: 7.7 g/dL (ref 6.5–8.1)

## 2023-12-26 LAB — TROPONIN I (HIGH SENSITIVITY)
Troponin I (High Sensitivity): 13 ng/L (ref <18)
Troponin I (High Sensitivity): 15 ng/L (ref <18)

## 2023-12-26 NOTE — Discharge Instructions (Signed)
 As we discussed, you head and neck CT scans are normal and do not show any injury. Your EKG is a normal rhythm and your labs are reassuring. You can be discharged home and should call your cardiologist tomorrow to schedule follow up in office.   Return to the ED with any new or concerning symptoms.

## 2023-12-26 NOTE — ED Triage Notes (Signed)
 Patient BIB EMS from home for evaluation after a fall. EMS report patient was at a birthday party, began complaining of dizziness and palpitations. Family took patient home and heard patient fall in the kitchen. Patient reports no history of similar incidents. Unknown if patient is on blood thinners. Patient states has had high BP for the past few days. Alert and oriented on arrival.

## 2023-12-26 NOTE — ED Provider Notes (Signed)
 [place] EMERGENCY DEPARTMENT AT [facility] Provider Note   CSN: [id number] Arrival date & time: 12/26/23  2003     Patient presents with: Ashley Johns is a 88 y.o. female.   Patient to ED after syncopal episode that occurred around 7:00 pm tonight. She started having palpitation like her heart was beating fast, she then became dizzy and had a brief syncopal episode, falling forward to the floor. No vomiting. She denies chest pain, SOB, nausea. No headache. Family reports no history of similar events, however, the patient also states she didn't take her medication for palpitations tonight.   The history is provided by the patient and a relative. No language interpreter was used.  Fall       Prior to Admission medications   Medication Sig Start Date End Date Taking? Authorizing Provider  acetaminophen  (TYLENOL ) 650 MG CR tablet Take 650 mg by mouth every 8 (eight) hours as needed for pain.    [provider]  albuterol  (VENTOLIN  HFA) 108 (90 Base) MCG/ACT inhaler Inhale 2 puffs into the lungs every 4 (four) hours as needed for wheezing or shortness of breath (coughing fits). 11/25/23   Luke Orlan HERO, DO  amLODipine  (NORVASC ) 10 MG tablet Take 1 tablet (10 mg total) by mouth daily. 01/26/23   Alvan Ronal BRAVO, MD  atorvastatin  (LIPITOR) 20 MG tablet Take 1 tablet (20 mg total) by mouth daily. 02/17/23   Ngetich, [name] C, NP  azelastine  (ASTELIN ) 0.1 % nasal spray Place 2 sprays into both nostrils 2 (two) times daily. Use in each nostril as directed 05/27/23   Ambs, Arlean HERO, FNP  benzonatate  (TESSALON ) 100 MG capsule Take 1 capsule (100 mg total) by mouth 2 (two) times daily as needed for cough. Morning and night 10/25/23   Fargo, Amy E, NP  budesonide -glycopyrrolate-formoterol  (BREZTRI  AEROSPHERE) 160-9-4.8 MCG/ACT AERO inhaler Inhale 2 puffs into the lungs in the morning and at bedtime. with spacer and rinse mouth afterwards. 11/25/23   Luke Orlan HERO, DO   carvedilol  (COREG ) 6.25 MG tablet Take 1 tablet (6.25 mg total) by mouth 2 (two) times daily. 01/26/23   Alvan Ronal BRAVO, MD  EPINEPHrine  (EPIPEN  2-PAK) 0.3 mg/0.3 mL IJ SOAJ injection Inject 0.3 mg into the muscle as needed. Inject 0.3mg  intramuscular route once as needed. 02/22/20   Ngetich, [name] C, NP  fluticasone  (FLONASE ) 50 MCG/ACT nasal spray Place 1-2 sprays into both nostrils daily as needed (nasal congestion). 11/25/23   Luke Orlan HERO, DO  furosemide  (LASIX ) 20 MG tablet 1 tablet Monday, Wednesday and friday 10/07/23   Walker, Caitlin S, NP  ipratropium (ATROVENT ) 0.03 % nasal spray Place 1-2 sprays into both nostrils 2 (two) times daily as needed (nasal drainage). 11/25/23   Luke Orlan HERO, DO  meloxicam  (MOBIC ) 7.5 MG tablet Take 1 tablet (7.5 mg total) by mouth daily as needed for pain. May increase to two daily if ineffective and notify provider 08/17/22   Ngetich, [name] C, NP  mometasone  (ELOCON ) 0.1 % cream Apply 1 Application topically daily as needed. 02/17/23   Ngetich, [name] C, NP  montelukast  (SINGULAIR ) 10 MG tablet Take 1 tablet (10 mg total) by mouth at bedtime. 10/25/23   Fargo, Amy E, NP  olmesartan  (BENICAR ) 40 MG tablet Take 1 tablet (40 mg total) by mouth daily. 01/26/23   Alvan Ronal BRAVO, MD  potassium chloride  (KLOR-CON ) 10 MEQ tablet 1 tablet Monday, Wednesday and friday 10/07/23   Walker, Caitlin S, NP  Allergies: Codeine, Tramadol, and Aspirin    Review of Systems  Updated Vital Signs BP (!) 159/82   Pulse 82   Temp 98.2 F (36.8 C) (Oral)   Resp 16   Ht 5' 1 (1.549 m)   Wt 74.9 kg   SpO2 97%   BMI 31.20 kg/m   Physical Exam Constitutional:      Appearance: She is well-developed.  HENT:     Head: Normocephalic.  Cardiovascular:     Rate and Rhythm: Normal rate and regular rhythm.     Heart sounds: Murmur heard.  Pulmonary:     Effort: Pulmonary effort is normal.     Breath sounds: Normal breath sounds. No wheezing, rhonchi or rales.  Abdominal:      General: Bowel sounds are normal.     Palpations: Abdomen is soft.     Tenderness: There is no abdominal tenderness. There is no guarding or rebound.  Musculoskeletal:        General: Normal range of motion.     Cervical back: Normal range of motion and neck supple.  Skin:    General: Skin is warm and dry.  Neurological:     General: No focal deficit present.     Mental Status: She is alert and oriented to person, place, and time.     GCS: GCS eye subscore is 4. GCS verbal subscore is 5. GCS motor subscore is 6.     Cranial Nerves: No cranial nerve deficit, dysarthria or facial asymmetry.     Sensory: Sensation is intact.     Motor: Motor function is intact. No pronator drift.     Coordination: Coordination is intact.     (all labs ordered are listed, but only abnormal results are displayed) Labs Reviewed  COMPREHENSIVE METABOLIC PANEL WITH GFR - Abnormal; Notable for the following components:      Result Value   Glucose, Bld 130 (*)    Creatinine, Ser 1.27 (*)    Calcium  8.6 (*)    Albumin 3.3 (*)    GFR, Estimated 41 (*)    All other components within normal limits  CBC WITH DIFFERENTIAL/PLATELET  TROPONIN I (HIGH SENSITIVITY)  TROPONIN I (HIGH SENSITIVITY)    EKG: EKG Interpretation Date/Time:  Sunday December 26 2023 21:30:35 EST Ventricular Rate:  82 PR Interval:  218 QRS Duration:  80 QT Interval:  396 QTC Calculation: 463 R Axis:   71  Text Interpretation: Sinus rhythm Borderline prolonged PR interval Confirmed by Elnor Hila ([id number]) on 12/26/2023 9:37:53 PM  Radiology: No results found. CT Head Wo Contrast Result Date: 12/26/2023 EXAM: CT HEAD AND CERVICAL SPINE 12/26/2023 09:07:00 PM TECHNIQUE: CT of the head and cervical spine was performed without the administration of intravenous contrast. Multiplanar reformatted images are provided for review. Automated exposure control, iterative reconstruction, and/or weight based adjustment of the mA/kV was utilized to  reduce the radiation dose to as low as reasonably achievable. COMPARISON: CT head 10/16/2022 CLINICAL HISTORY: Head trauma, minor (Age >= 65y) FINDINGS: CT HEAD BRAIN AND VENTRICLES: No acute intracranial hemorrhage. No mass effect or midline shift. No abnormal extra-axial fluid collection. No evidence of acute infarct. No hydrocephalus. ORBITS: No acute abnormality. SINUSES AND MASTOIDS: No acute abnormality. SOFT TISSUES AND SKULL: No acute skull fracture. No acute soft tissue abnormality. CT CERVICAL SPINE BONES AND ALIGNMENT: No acute fracture or traumatic malalignment. Degenerative grade 1 anterolisthesis of C4 on C5. DEGENERATIVE CHANGES: Moderate bony degenerative change including disc height loss, endplate sclerosis/spurring,  and facet/uncovertebral hypertrophy. Varying degrees of neural foraminal stenosis. SOFT TISSUES: No prevertebral soft tissue swelling. Heterogeneous thyroid  with multiple nodules. IMPRESSION: 1. No acute intracranial abnormality. 2. No acute fracture or traumatic malalignment of the cervical spine. Electronically signed by: Gilmore Molt MD 12/26/2023 09:26 PM EST RP Workstation: [id number]   CT Cervical Spine Wo Contrast Result Date: 12/26/2023 EXAM: CT HEAD AND CERVICAL SPINE 12/26/2023 09:07:00 PM TECHNIQUE: CT of the head and cervical spine was performed without the administration of intravenous contrast. Multiplanar reformatted images are provided for review. Automated exposure control, iterative reconstruction, and/or weight based adjustment of the mA/kV was utilized to reduce the radiation dose to as low as reasonably achievable. COMPARISON: CT head 10/16/2022 CLINICAL HISTORY: Head trauma, minor (Age >= 65y) FINDINGS: CT HEAD BRAIN AND VENTRICLES: No acute intracranial hemorrhage. No mass effect or midline shift. No abnormal extra-axial fluid collection. No evidence of acute infarct. No hydrocephalus. ORBITS: No acute abnormality. SINUSES AND MASTOIDS: No acute  abnormality. SOFT TISSUES AND SKULL: No acute skull fracture. No acute soft tissue abnormality. CT CERVICAL SPINE BONES AND ALIGNMENT: No acute fracture or traumatic malalignment. Degenerative grade 1 anterolisthesis of C4 on C5. DEGENERATIVE CHANGES: Moderate bony degenerative change including disc height loss, endplate sclerosis/spurring, and facet/uncovertebral hypertrophy. Varying degrees of neural foraminal stenosis. SOFT TISSUES: No prevertebral soft tissue swelling. Heterogeneous thyroid  with multiple nodules. IMPRESSION: 1. No acute intracranial abnormality. 2. No acute fracture or traumatic malalignment of the cervical spine. Electronically signed by: Gilmore Molt MD 12/26/2023 09:26 PM EST RP Workstation: [id number]     Procedures   Medications Ordered in the ED - No data to display  Clinical Course as of 12/31/23 2039  Sun Dec 26, 2023  2036 Patient to ED after brief syncopal episode after a family birthday party. History of same in the past. Family feels she is not keeping her fluids up. She reports palpitations prior to episode with pain, SoB. She fell forward, hitting her head on the floor. No subsequent nausea or vomiting. She denies headache.   Labs reassuring. Mild renal dysfunction with Dr. 1.27. No leukocytosis, no anemia. EKG NSR. Troponin negative. Head and neck CT are negative for acute injury. The patient feels asymptomatic. Discussed with ED attending, Dr. RONAL Pereyra. She is felt stable for discharge home.  [SU]    Clinical Course User Index [SU] Odell Balls, PA-C                                 Medical Decision Making Amount and/or Complexity of Data Reviewed Labs: ordered. Radiology: ordered.        Final diagnoses:  Palpitations  Syncope, unspecified syncope type    ED Discharge Orders     None          Odell Balls, PA-C 12/31/23 2040    Pereyra Hila P, DO 01/15/24 [id number]

## 2024-01-11 ENCOUNTER — Other Ambulatory Visit: Payer: Self-pay | Admitting: Family

## 2024-01-11 DIAGNOSIS — I10 Essential (primary) hypertension: Secondary | ICD-10-CM

## 2024-01-17 ENCOUNTER — Ambulatory Visit (HOSPITAL_BASED_OUTPATIENT_CLINIC_OR_DEPARTMENT_OTHER): Admitting: Cardiovascular Disease

## 2024-01-17 NOTE — Progress Notes (Deleted)
 Cardiology Office Note:  .   Date:  01/17/2024  ID:  Ashley Johns, DOB 1936-02-03, MRN [id number] PCP: Ashley Roxan JAYSON, NP  [facility] Providers Cardiologist:  Annabella Scarce, MD Cardiology APP:  Percy Rosaline HERO, NP { Click to update primary MD,subspecialty MD or APP then REFRESH:1}   History of Present Illness: .    Ashley Johns is a 88 y.o. female with nonobstructive CAD, hypertension, mitral regurgitation, mild to moderate aortic stenosis, and Graves' disease here for follow-up.  She was previously a patient of Dr. Alvan.  She was seen 04/2021 for shortness of breath.  She noted exertional dyspnea walking up steps.  She also had some chest pressure.  Echo revealed LVEF 50-55% with grade 1 diastolic dysfunction and hypokinesis of the basal to mid inferolateral myocardium.  She had mild to moderate MR and aortic valve gradient was 10 mmHg.  She wore an ambulatory monitor that revealed frequent episodes of SVT that were brief.  She was seen 01/2023 after a syncopal event.  At the time her oxygen saturation was in the 80s and blood pressure was 208/135.  BNP 127.  This occurred after having found out that a charge member had passed away.  Amlodipine  was added to her regimen.  She followed up with Rosaline Booty, NP 05/2023.  She had this swelling after increasing her amlodipine .  She was advised to wear compression stockings and increase her exercise.  Echo 05/2023 revealed LVEF 60-65%.  RV was mildly enlarged and PASP 59 mmHg.  She was started on Lasix .  SHe saw Miriam Shams, NP 09/2023 and reported worsening exertional dyspnea and chest tightness when going up steps.  She was referred for nuclear stress test 09/2023 that revealed LVEF 73% with 1 mm of horizontal ST depression but no perfusion defects.  She was noted to have severe coronary calcification on CT.  Discussed the use of AI scribe software for clinical note transcription with the patient, who gave verbal consent to  proceed.  History of Present Illness     ROS:  As per HPI  Studies Reviewed: .       Lexiscan  Myoview  10/06/23:   The study is normal. The study is low risk.   1.0 mm of horizontal ST depression (III) was noted. The ECG was not diagnostic due to pharmacologic protocol.   LV perfusion is normal. There is no evidence of ischemia. There is no evidence of infarction.   Left ventricular function is normal. Nuclear stress EF: 73%. The left ventricular ejection fraction is hyperdynamic (>65%). End diastolic cavity size is normal. End systolic cavity size is normal.   CT images were obtained for attenuation correction and were examined for the presence of coronary calcium  when appropriate.   Coronary calcium  was present on the attenuation correction CT images. Severe coronary calcifications were present. Coronary calcifications were present in the left anterior descending artery, left circumflex artery and right coronary artery distribution(s).   Prior study available for comparison from 05/21/2021. There are changes compared to prior study. The left ventricular ejection fraction has increased.   Echo 06/11/2023:    1. Left ventricular ejection fraction, by estimation, is 60 to 65%. Left  ventricular ejection fraction by 3D volume is 64 %. The left ventricle has  normal function. The left ventricle has no regional wall motion  abnormalities. There is mild left  ventricular hypertrophy. Indeterminate diastolic filling due to E-A  fusion.   2. Right ventricular systolic function is mildly  reduced. The right  ventricular size is mildly enlarged. There is moderately elevated  pulmonary artery systolic pressure. The estimated right ventricular  systolic pressure is 59.0 mmHg.   3. The mitral valve is grossly normal. Trivial mitral valve  regurgitation. No evidence of mitral stenosis.   4. Fused right and left coronary cusps. The aortic valve is abnormal.  There is severe calcifcation of the  aortic valve. Aortic valve  regurgitation is trivial. Mild to moderate aortic valve stenosis. Aortic  valve area, by VTI measures 1.12 cm. Aortic  valve mean gradient measures 10.0 mmHg. Aortic valve Vmax measures 2.13  m/s, SVI 30, DVI 0.34.   5. The inferior vena cava is normal in size with greater than 50%  respiratory variability, suggesting right atrial pressure of 3 mmHg.     *** Risk Assessment/Calculations:   {Does this patient have ATRIAL FIBRILLATION?[phone]} No BP recorded.  {Refresh Note OR Click here to enter BP  :1}***       Physical Exam:   VS:  There were no vitals taken for this visit. , BMI There is no height or weight on file to calculate BMI. GENERAL:  Well appearing HEENT: Pupils equal round and reactive, fundi not visualized, oral mucosa unremarkable NECK:  No jugular venous distention, waveform within normal limits, carotid upstroke brisk and symmetric, no bruits, no thyromegaly LYMPHATICS:  No cervical adenopathy LUNGS:  Clear to auscultation bilaterally HEART:  RRR.  PMI not displaced or sustained,S1 and S2 within normal limits, no S3, no S4, no clicks, no rubs, *** murmurs ABD:  Flat, positive bowel sounds normal in frequency in pitch, no bruits, no rebound, no guarding, no midline pulsatile mass, no hepatomegaly, no splenomegaly EXT:  2 plus pulses throughout, no edema, no cyanosis no clubbing SKIN:  No rashes no nodules NEURO:  Cranial nerves II through XII grossly intact, motor grossly intact throughout PSYCH:  Cognitively intact, oriented to person place and time   ASSESSMENT AND PLAN: .   *** Assessment and Plan Assessment & Plan         {Are you ordering a CV Procedure (e.g. stress test, cath, DCCV, TEE, etc)?   Press F2        :[id number]}  Dispo: ***  Signed, Annabella Scarce, MD

## 2024-02-01 ENCOUNTER — Ambulatory Visit

## 2024-02-01 DIAGNOSIS — Z23 Encounter for immunization: Secondary | ICD-10-CM

## 2024-03-10 ENCOUNTER — Encounter: Payer: Self-pay | Admitting: Family

## 2024-03-10 ENCOUNTER — Ambulatory Visit: Payer: Self-pay | Admitting: Family

## 2024-03-10 VITALS — BP 136/84 | HR 89 | Temp 97.6°F | Resp 20 | Ht 61.0 in | Wt 163.2 lb

## 2024-03-10 DIAGNOSIS — R053 Chronic cough: Secondary | ICD-10-CM | POA: Diagnosis not present

## 2024-03-10 DIAGNOSIS — J4520 Mild intermittent asthma, uncomplicated: Secondary | ICD-10-CM

## 2024-03-10 DIAGNOSIS — I4891 Unspecified atrial fibrillation: Secondary | ICD-10-CM

## 2024-03-10 DIAGNOSIS — E785 Hyperlipidemia, unspecified: Secondary | ICD-10-CM | POA: Diagnosis not present

## 2024-03-10 DIAGNOSIS — L308 Other specified dermatitis: Secondary | ICD-10-CM | POA: Diagnosis not present

## 2024-03-10 DIAGNOSIS — R7303 Prediabetes: Secondary | ICD-10-CM

## 2024-03-10 DIAGNOSIS — M150 Primary generalized (osteo)arthritis: Secondary | ICD-10-CM

## 2024-03-10 DIAGNOSIS — I10 Essential (primary) hypertension: Secondary | ICD-10-CM | POA: Diagnosis not present

## 2024-03-10 MED ORDER — ATORVASTATIN CALCIUM 20 MG PO TABS: 20.0000 mg | ORAL_TABLET | Freq: Every day | ORAL | 1 refills | Status: AC

## 2024-03-10 MED ORDER — BENZONATATE 100 MG PO CAPS: 100.0000 mg | ORAL_CAPSULE | Freq: Two times a day (BID) | ORAL | 3 refills | Status: AC | PRN

## 2024-03-10 MED ORDER — MOMETASONE FUROATE 0.1 % EX CREA: 1.0000 | TOPICAL_CREAM | Freq: Every day | CUTANEOUS | 3 refills | Status: AC | PRN

## 2024-03-10 NOTE — Patient Instructions (Signed)
 1.Report to local pharmacy to receive Shingles vaccine. 2.Stop at check out & schedule Annual Wellness Visit.

## 2024-03-10 NOTE — Progress Notes (Signed)
 "  Provider: Alonte Wulff FNP-C   [name], Roxan BROCKS, NP  Patient Care Team: Ecko Beasley, Roxan BROCKS, NP as PCP - General (Family Medicine) Raford Riggs, MD as PCP - Cardiology Lynnell Nottingham, MD as Consulting Physician (Dermatology) Swinyer, Rosaline HERO, NP as Nurse Practitioner (Cardiology)  Extended Emergency Contact Information Primary Emergency Contact: [name],[name]  [place] Mobile Phone: [phone] Relation: Daughter Secondary Emergency Contact: booker,melonie Mobile Phone: [phone] Relation: Granddaughter  Code Status:  Full Code  Goals of care: Advanced Directive information    03/10/2024    9:28 AM  Advanced Directives  Does Patient Have a Medical Advance Directive? No  Would patient like information on creating a medical advance directive? No - Patient declined     Chief Complaint  Patient presents with   Medical Management of Chronic Issues    6 Month follow up.    History of Present Illness   Ashley Johns is an 89 year old female who presents for a six-month follow-up visit.  She experiences episodes of dizziness, which recently led to a fall where she landed on her face. The dizziness was associated with missing a dose of her medication and consuming three cups of coffee due to excitement about attending her pastor's birthday party. Although she did not sustain significant injuries, her teeth were momentarily affected. She continues to experience dizziness, particularly when turning in the kitchen, but has not had any further falls.  Her blood pressure has been stable, with a recent reading of 136/84 mmHg. She takes olmesartan  40 mg, carvedilol  6.25 mg twice daily, and amlodipine  10 mg for blood pressure management. She also takes a cholesterol medication with her evening meal.  She manages her asthma with Singulair  at night, Breztri , and albuterol  inhalers. No recent changes in vision, pain in the forehead or sinuses, and no swelling  or pain in her legs.  She takes furosemide  20 mg for fluid management and reports no swelling in her legs. For pain management, she uses meloxicam , which she finds effective for back pain.  She has a history of elevated blood sugar levels, with a previous A1c of 6.3%. She plans to have her blood sugar rechecked next week, ensuring she fasts beforehand for accurate results. She requires refills for her medications, including a cream and cough medication, as she has been experiencing a cough.   Past Medical History:  Diagnosis Date   Eczema    Per [facility] new patient packet   Graves disease    Per [facility] new patient packet   High blood pressure    Per [facility] new patient packet   Hypertension    Past Surgical History:  Procedure Laterality Date   TOTAL ABDOMINAL HYSTERECTOMY Bilateral 1970's   TUBAL LIGATION      Allergies[1]  Allergies as of 03/10/2024       Reactions   Codeine Nausea Only   unknown   Tramadol Nausea Only   Aspirin Rash        Medication List        Accurate as of March 10, 2024 12:14 PM. If you have any questions, ask your nurse or doctor.          acetaminophen  650 MG CR tablet Commonly known as: TYLENOL  Take 650 mg by mouth every 8 (eight) hours as needed for pain.   albuterol  108 (90 Base) MCG/ACT inhaler Commonly known as: Ventolin  HFA Inhale 2 puffs into the lungs every 4 (four) hours as needed for wheezing or  shortness of breath (coughing fits).   amLODipine  10 MG tablet Commonly known as: NORVASC  Take 1 tablet (10 mg total) by mouth daily.   atorvastatin  20 MG tablet Commonly known as: LIPITOR Take 1 tablet (20 mg total) by mouth daily.   azelastine  0.1 % nasal spray Commonly known as: ASTELIN  Place 2 sprays into both nostrils 2 (two) times daily. Use in each nostril as directed   benzonatate  100 MG capsule Commonly known as: TESSALON  Take 1 capsule (100 mg total) by mouth 2 (two) times daily as needed for cough. Morning and night    Breztri  Aerosphere 160-9-4.8 MCG/ACT Aero inhaler Generic drug: budesonide -glycopyrrolate-formoterol  Inhale 2 puffs into the lungs in the morning and at bedtime. with spacer and rinse mouth afterwards.   carvedilol  6.25 MG tablet Commonly known as: COREG  Take 1 tablet (6.25 mg total) by mouth 2 (two) times daily.   EPINEPHrine  0.3 mg/0.3 mL Soaj injection Commonly known as: EpiPen  2-Pak Inject 0.3 mg into the muscle as needed. Inject 0.3mg  intramuscular route once as needed.   fluticasone  50 MCG/ACT nasal spray Commonly known as: FLONASE  Place 1-2 sprays into both nostrils daily as needed (nasal congestion).   furosemide  20 MG tablet Commonly known as: LASIX  1 tablet Monday, Wednesday and friday   ipratropium 0.03 % nasal spray Commonly known as: ATROVENT  Place 1-2 sprays into both nostrils 2 (two) times daily as needed (nasal drainage).   meloxicam  7.5 MG tablet Commonly known as: MOBIC  Take 1 tablet (7.5 mg total) by mouth daily as needed for pain. May increase to two daily if ineffective and notify provider   mometasone  0.1 % cream Commonly known as: ELOCON  Apply 1 Application topically daily as needed.   montelukast  10 MG tablet Commonly known as: SINGULAIR  Take 1 tablet (10 mg total) by mouth at bedtime.   olmesartan  40 MG tablet Commonly known as: BENICAR  TAKE 1 TABLET(40 MG) BY MOUTH DAILY   potassium chloride  10 MEQ tablet Commonly known as: KLOR-CON  1 tablet Monday, Wednesday and friday        Review of Systems  Constitutional:  Negative for appetite change, chills, fatigue, fever and unexpected weight change.  HENT:  Negative for congestion, dental problem, ear discharge, ear pain, hearing loss, nosebleeds, postnasal drip, rhinorrhea, sinus pressure, sinus pain, sneezing, sore throat, tinnitus and trouble swallowing.   Eyes:  Negative for pain, discharge, redness, itching and visual disturbance.  Respiratory:  Negative for cough, chest tightness,  shortness of breath and wheezing.   Cardiovascular:  Negative for chest pain, palpitations and leg swelling.  Gastrointestinal:  Negative for abdominal distention, abdominal pain, blood in stool, constipation, diarrhea, nausea and vomiting.  Endocrine: Negative for cold intolerance, heat intolerance, polydipsia, polyphagia and polyuria.  Genitourinary:  Negative for difficulty urinating, dysuria, flank pain, frequency and urgency.  Musculoskeletal:  Negative for arthralgias, back pain, gait problem, joint swelling, myalgias, neck pain and neck stiffness.  Skin:  Negative for color change, pallor, rash and wound.  Neurological:  Negative for dizziness, syncope, speech difficulty, weakness, light-headedness, numbness and headaches.  Hematological:  Does not bruise/bleed easily.  Psychiatric/Behavioral:  Negative for agitation, behavioral problems, confusion, hallucinations, self-injury, sleep disturbance and suicidal ideas. The patient is not nervous/anxious.     Immunization History  Administered Date(s) Administered   Fluad Quad(high Dose 65+) 10/11/2018   Fluad Trivalent(High Dose 65+) 10/23/2022   INFLUENZA, HIGH DOSE SEASONAL PF 12/03/2015, 11/07/2019, 02/01/2024   Influenza,inj,Quad PF,6+ Mos 10/11/2018   Influenza-Unspecified 11/16/2017, 11/27/2020   PFIZER Comirnaty(Gray  Top)Covid-19 Tri-Sucrose Vaccine 07/31/2020   PFIZER(Purple Top)SARS-COV-2 Vaccination 02/18/2019, 03/11/2019, 11/29/2019, 07/31/2020   PNEUMOCOCCAL CONJUGATE-20 09/08/2023   Pneumococcal Conjugate-13 10/11/2018   Pneumococcal-Unspecified 04/27/2023   Td 10/11/2018   Tdap 10/11/2018   Unspecified SARS-COV-2 Vaccination 05/04/2023   Pertinent  Health Maintenance Due  Topic Date Due   Influenza Vaccine  Completed   Bone Density Scan  Completed      08/17/2022    8:46 AM 04/14/2023    9:53 AM 06/09/2023    9:09 AM 09/08/2023   11:09 AM 03/10/2024    9:27 AM  Fall Risk  Falls in the past year? 0 0 0 0 1  Was  there an injury with Fall? 0  0  0  0  1  Fall Risk Category Calculator 0 0 0 0 2  Patient at Risk for Falls Due to No Fall Risks No Fall Risks No Fall Risks No Fall Risks No Fall Risks  Fall risk Follow up Falls evaluation completed Education provided;Falls evaluation completed;Falls prevention discussed Falls prevention discussed;Falls evaluation completed Falls evaluation completed Falls evaluation completed     Data saved with a previous flowsheet row definition   Functional Status Survey:    Vitals:   03/10/24 0932  BP: 136/84  Pulse: 89  Resp: 20  Temp: 97.6 F (36.4 C)  SpO2: 95%  Weight: 163 lb 3.2 oz (74 kg)  Height: 5' 1 (1.549 m)   Body mass index is 30.84 kg/m. Physical Exam  VITALS: T- 97.6, P- 89, BP- 136/84, SaO2- 95% GENERAL: Alert, cooperative, well developed, no acute distress. HEENT: Normocephalic, normal oropharynx, moist mucous membranes, ears normal without cerumen impaction, Heard of hearing hearing aids in place.nose normal without discharge, oral cavity normal without lesions, throat normal without exudate, no sinus tenderness. NECK: Supple, no tenderness. CHEST: Clear to auscultation bilaterally, no wheezes, rhonchi, or crackles. CARDIOVASCULAR: Normal heart rate and rhythm, S1 and S2 normal without murmurs. ABDOMEN: Soft, non-tender, non-distended, without organomegaly, normal bowel sounds. EXTREMITIES: No cyanosis or edema. MUSCULOSKELETAL: Crepitus in knees bilaterally. NEUROLOGICAL: Cranial nerves grossly intact, moves all extremities without gross motor or sensory deficit.  SKIN: No rash,no lesion or erythema   PSYCHIATRY/BEHAVIORAL: Mood stable memory loss   Labs reviewed: Recent Labs    09/24/23 1330 10/01/23 1123 10/12/23 1056 12/26/23 2134  NA 142 140 141 138  K 4.4 4.0 4.3 3.5  CL 102 103 100 104  CO2 22 21 23 23   GLUCOSE 127* 133* 84 130*  BUN 15 22 16 18   CREATININE 0.81 1.08* 0.82 1.27*  CALCIUM  9.8 9.4 9.5 8.6*  MG 1.9  --    --   --    Recent Labs    07/26/23 0956 12/26/23 2134  AST 15 20  ALT 10 13  ALKPHOS 143* 112  BILITOT 0.3 0.4  PROT 8.3 7.7  ALBUMIN 4.2 3.3*   Recent Labs    09/08/23 1157 12/26/23 2134  WBC 5.2 6.1  NEUTROABS 3,208 4.4  HGB 13.2 14.2  HCT 41.2 44.3  MCV 94.5 94.7  PLT 220 254   Lab Results  Component Value Date   TSH 0.72 09/08/2023   Lab Results  Component Value Date   HGBA1C 6.3 (H) 09/08/2023   Lab Results  Component Value Date   CHOL 169 07/26/2023   HDL 58 07/26/2023   LDLCALC 97 07/26/2023   TRIG 73 07/26/2023   CHOLHDL 3.2 02/17/2023    Significant Diagnostic Results in last 30 days:  No  results found.  Assessment/Plan  Chronic cough Persistent cough requiring medication. - Prescribed cough medication.  Essential hypertension Blood pressure is well-controlled with current medication regimen. Recent reading was 136/84 mmHg. - Continue olmesartan  40 mg daily. - Continue carvedilol  6.25 mg twice daily. - Continue amlodipine  10 mg daily.  Afib  HR controlled  - Not on anticoagulation  - continue to monitor   Hyperlipidemia Cholesterol management discussed. Advised to take medication with evening meal for better efficacy. - Continue current cholesterol medication. - Advised taking cholesterol medication with evening meal.  Mild intermittent asthma Asthma is well-controlled with current medication regimen. - Continue Breztri  and albuterol  as needed. - Continue Singulair  at bedtime.  Prediabetes Previous A1c was 6.3%. Plan to recheck blood sugar levels. - Scheduled lab work for next week to recheck blood sugar levels. - Advised fasting before lab work. - continue dietary modification and exercise as tolerated   Knee osteoarthritis Mild arthritis in both knees with grinding sensation. No significant pain reported. - Continue exercise to strengthen muscles around the knee.  General Health Maintenance Due for shingles vaccine and  Medicare visit. - Scheduled Medicare visit at front desk.   Family/ staff Communication: Reviewed plan of care with patient verbalized understanding   Labs/tests ordered: - CBC with Differential/Platelet - CMP with eGFR([facility]) - TSH - Hgb A1C - Lipid panel  Next Appointment : Return in about 6 months (around 09/07/2024) for medical mangement of chronic issues., Annual wellness visit soon, fasting labs in one week.   Spent 30 minutes of Face to face and non-face to face with patient  >50% time spent counseling; reviewing medical record; tests; labs; documentation and developing future plan of care.   Roxan BROCKS Roberto Romanoski, NP       [1]  Allergies Allergen Reactions   Codeine Nausea Only    unknown   Tramadol Nausea Only   Aspirin Rash   "

## 2024-03-14 ENCOUNTER — Other Ambulatory Visit

## 2024-03-14 DIAGNOSIS — I10 Essential (primary) hypertension: Secondary | ICD-10-CM

## 2024-03-14 DIAGNOSIS — I4891 Unspecified atrial fibrillation: Secondary | ICD-10-CM

## 2024-03-14 DIAGNOSIS — J4520 Mild intermittent asthma, uncomplicated: Secondary | ICD-10-CM

## 2024-03-14 DIAGNOSIS — R7303 Prediabetes: Secondary | ICD-10-CM

## 2024-03-14 DIAGNOSIS — E785 Hyperlipidemia, unspecified: Secondary | ICD-10-CM

## 2024-03-15 LAB — CBC WITH DIFFERENTIAL/PLATELET
Absolute Lymphocytes: 1176 {cells}/uL (ref 850–3900)
Absolute Monocytes: 392 {cells}/uL (ref 200–950)
Basophils Absolute: 28 {cells}/uL (ref 0–200)
Basophils Relative: 0.4 %
Eosinophils Absolute: 140 {cells}/uL (ref 15–500)
Eosinophils Relative: 2 %
HCT: 44.4 % (ref 35.9–46.0)
Hemoglobin: 14.3 g/dL (ref 11.7–15.5)
MCH: 30.4 pg (ref 27.0–33.0)
MCHC: 32.2 g/dL (ref 31.6–35.4)
MCV: 94.5 fL (ref 81.4–101.7)
MPV: 9.9 fL (ref 7.5–12.5)
Monocytes Relative: 5.6 %
Neutro Abs: 5264 {cells}/uL (ref 1500–7800)
Neutrophils Relative %: 75.2 %
Platelets: 284 10*3/uL (ref 140–400)
RBC: 4.7 Million/uL (ref 3.80–5.10)
RDW: 12.6 % (ref 11.0–15.0)
Total Lymphocyte: 16.8 %
WBC: 7 10*3/uL (ref 3.8–10.8)

## 2024-03-15 LAB — COMPREHENSIVE METABOLIC PANEL WITH GFR
AG Ratio: 1.1 (calc) (ref 1.0–2.5)
ALT: 8 U/L (ref 6–29)
AST: 11 U/L (ref 10–35)
Albumin: 4.2 g/dL (ref 3.6–5.1)
Alkaline phosphatase (APISO): 116 U/L (ref 37–153)
BUN: 15 mg/dL (ref 7–25)
CO2: 27 mmol/L (ref 20–32)
Calcium: 9.5 mg/dL (ref 8.6–10.4)
Chloride: 104 mmol/L (ref 98–110)
Creat: 0.85 mg/dL (ref 0.60–0.95)
Globulin: 3.8 g/dL — ABNORMAL HIGH (ref 1.9–3.7)
Glucose, Bld: 97 mg/dL (ref 65–99)
Potassium: 4.4 mmol/L (ref 3.5–5.3)
Sodium: 142 mmol/L (ref 135–146)
Total Bilirubin: 0.5 mg/dL (ref 0.2–1.2)
Total Protein: 8 g/dL (ref 6.1–8.1)
eGFR: 66 mL/min/{1.73_m2}

## 2024-03-15 LAB — LIPID PANEL
Cholesterol: 170 mg/dL
HDL: 59 mg/dL
LDL Cholesterol (Calc): 94 mg/dL
Non-HDL Cholesterol (Calc): 111 mg/dL
Total CHOL/HDL Ratio: 2.9 (calc)
Triglycerides: 79 mg/dL

## 2024-03-15 LAB — HEMOGLOBIN A1C
Hgb A1c MFr Bld: 6.1 % — ABNORMAL HIGH
Mean Plasma Glucose: 128 mg/dL
eAG (mmol/L): 7.1 mmol/L

## 2024-03-15 LAB — TSH: TSH: 0.94 m[IU]/L (ref 0.40–4.50)

## 2024-03-16 ENCOUNTER — Ambulatory Visit: Payer: Self-pay | Admitting: Family

## 2024-03-28 ENCOUNTER — Ambulatory Visit: Admitting: Allergy

## 2024-05-02 ENCOUNTER — Ambulatory Visit (HOSPITAL_BASED_OUTPATIENT_CLINIC_OR_DEPARTMENT_OTHER): Admitting: Cardiovascular Disease

## 2024-06-02 ENCOUNTER — Ambulatory Visit: Admitting: Family

## 2024-09-07 ENCOUNTER — Ambulatory Visit: Admitting: Family
